# Patient Record
Sex: Female | Born: 1940 | Race: Black or African American | Hispanic: No | Marital: Married | Smoking: Never smoker
Health system: Southern US, Community
[De-identification: ages and names within clinical notes are randomized; demographics above are authoritative.]

## PROBLEM LIST (undated history)

## (undated) DIAGNOSIS — R011 Cardiac murmur, unspecified: Secondary | ICD-10-CM

## (undated) DIAGNOSIS — E039 Hypothyroidism, unspecified: Secondary | ICD-10-CM

## (undated) DIAGNOSIS — M1990 Unspecified osteoarthritis, unspecified site: Secondary | ICD-10-CM

## (undated) DIAGNOSIS — K259 Gastric ulcer, unspecified as acute or chronic, without hemorrhage or perforation: Secondary | ICD-10-CM

## (undated) DIAGNOSIS — Z8719 Personal history of other diseases of the digestive system: Secondary | ICD-10-CM

## (undated) DIAGNOSIS — E559 Vitamin D deficiency, unspecified: Secondary | ICD-10-CM

## (undated) DIAGNOSIS — M109 Gout, unspecified: Secondary | ICD-10-CM

## (undated) DIAGNOSIS — E7800 Pure hypercholesterolemia, unspecified: Secondary | ICD-10-CM

## (undated) DIAGNOSIS — K5900 Constipation, unspecified: Secondary | ICD-10-CM

## (undated) DIAGNOSIS — J45909 Unspecified asthma, uncomplicated: Secondary | ICD-10-CM

## (undated) DIAGNOSIS — N289 Disorder of kidney and ureter, unspecified: Secondary | ICD-10-CM

## (undated) DIAGNOSIS — K219 Gastro-esophageal reflux disease without esophagitis: Secondary | ICD-10-CM

## (undated) DIAGNOSIS — Z9889 Other specified postprocedural states: Secondary | ICD-10-CM

## (undated) DIAGNOSIS — R739 Hyperglycemia, unspecified: Secondary | ICD-10-CM

## (undated) DIAGNOSIS — F32A Depression, unspecified: Secondary | ICD-10-CM

## (undated) DIAGNOSIS — I10 Essential (primary) hypertension: Secondary | ICD-10-CM

## (undated) DIAGNOSIS — H3552 Pigmentary retinal dystrophy: Secondary | ICD-10-CM

## (undated) DIAGNOSIS — C801 Malignant (primary) neoplasm, unspecified: Secondary | ICD-10-CM

## (undated) DIAGNOSIS — F419 Anxiety disorder, unspecified: Secondary | ICD-10-CM

## (undated) DIAGNOSIS — F329 Major depressive disorder, single episode, unspecified: Secondary | ICD-10-CM

## (undated) DIAGNOSIS — J449 Chronic obstructive pulmonary disease, unspecified: Secondary | ICD-10-CM

## (undated) HISTORY — DX: Personal history of other diseases of the digestive system: Z87.19

## (undated) HISTORY — DX: Unspecified asthma, uncomplicated: J45.909

## (undated) HISTORY — DX: Vitamin D deficiency, unspecified: E55.9

## (undated) HISTORY — DX: Malignant (primary) neoplasm, unspecified: C80.1

## (undated) HISTORY — DX: Unspecified osteoarthritis, unspecified site: M19.90

## (undated) HISTORY — DX: Gastric ulcer, unspecified as acute or chronic, without hemorrhage or perforation: K25.9

## (undated) HISTORY — PX: ROTATOR CUFF REPAIR: SHX139

## (undated) HISTORY — DX: Hypothyroidism, unspecified: E03.9

## (undated) HISTORY — DX: Hyperglycemia, unspecified: R73.9

## (undated) HISTORY — DX: Cardiac murmur, unspecified: R01.1

## (undated) HISTORY — DX: Pigmentary retinal dystrophy: H35.52

## (undated) HISTORY — PX: ABDOMINAL HYSTERECTOMY: SHX81

## (undated) HISTORY — PX: BREAST SURGERY: SHX581

---

## 1998-01-22 HISTORY — PX: LAPAROSCOPY: SHX197

## 2001-01-22 DIAGNOSIS — Z9889 Other specified postprocedural states: Secondary | ICD-10-CM

## 2001-01-22 HISTORY — DX: Other specified postprocedural states: Z98.890

## 2003-11-05 ENCOUNTER — Ambulatory Visit: Payer: Self-pay | Admitting: Occupational Therapy

## 2003-11-23 ENCOUNTER — Ambulatory Visit: Payer: Self-pay | Admitting: Occupational Therapy

## 2004-04-22 ENCOUNTER — Emergency Department (HOSPITAL_COMMUNITY): Admission: EM | Admit: 2004-04-22 | Discharge: 2004-04-22 | Payer: Self-pay | Admitting: Emergency Medicine

## 2006-09-19 ENCOUNTER — Ambulatory Visit: Payer: Self-pay | Admitting: Nurse Practitioner

## 2006-12-10 ENCOUNTER — Ambulatory Visit: Payer: Self-pay | Admitting: Nurse Practitioner

## 2007-01-09 ENCOUNTER — Ambulatory Visit: Payer: Self-pay | Admitting: Nurse Practitioner

## 2007-04-01 ENCOUNTER — Ambulatory Visit: Payer: Self-pay | Admitting: Nurse Practitioner

## 2007-04-28 ENCOUNTER — Ambulatory Visit: Payer: Self-pay | Admitting: Nurse Practitioner

## 2008-05-03 ENCOUNTER — Ambulatory Visit: Payer: Self-pay | Admitting: Obstetrics and Gynecology

## 2008-05-04 ENCOUNTER — Ambulatory Visit: Payer: Self-pay | Admitting: Obstetrics and Gynecology

## 2008-07-02 ENCOUNTER — Ambulatory Visit: Payer: Self-pay | Admitting: Urology

## 2008-07-15 ENCOUNTER — Ambulatory Visit: Payer: Self-pay | Admitting: Obstetrics and Gynecology

## 2008-07-27 ENCOUNTER — Inpatient Hospital Stay: Payer: Self-pay | Admitting: Obstetrics and Gynecology

## 2008-11-30 ENCOUNTER — Ambulatory Visit: Payer: Self-pay | Admitting: Nurse Practitioner

## 2009-04-23 ENCOUNTER — Emergency Department (HOSPITAL_COMMUNITY): Admission: EM | Admit: 2009-04-23 | Discharge: 2009-04-23 | Payer: Self-pay | Admitting: Emergency Medicine

## 2009-08-04 ENCOUNTER — Ambulatory Visit: Payer: Self-pay

## 2009-12-01 ENCOUNTER — Ambulatory Visit: Payer: Self-pay | Admitting: Unknown Physician Specialty

## 2009-12-14 ENCOUNTER — Ambulatory Visit: Payer: Self-pay | Admitting: Unknown Physician Specialty

## 2009-12-22 ENCOUNTER — Ambulatory Visit: Payer: Self-pay | Admitting: Unknown Physician Specialty

## 2010-02-20 ENCOUNTER — Encounter: Payer: Self-pay | Admitting: Surgery

## 2010-02-22 ENCOUNTER — Encounter: Payer: Self-pay | Admitting: Surgery

## 2010-03-23 ENCOUNTER — Encounter: Payer: Self-pay | Admitting: Surgery

## 2010-05-30 ENCOUNTER — Ambulatory Visit: Payer: Self-pay

## 2010-11-03 ENCOUNTER — Ambulatory Visit: Payer: Self-pay | Admitting: Nurse Practitioner

## 2011-01-06 ENCOUNTER — Inpatient Hospital Stay (HOSPITAL_COMMUNITY)
Admission: EM | Admit: 2011-01-06 | Discharge: 2011-01-08 | DRG: 392 | Disposition: A | Discharge status: Home / Self Care | Payer: Medicare Other | Attending: Internal Medicine | Admitting: Internal Medicine

## 2011-01-06 ENCOUNTER — Encounter: Payer: Self-pay | Admitting: Emergency Medicine

## 2011-01-06 ENCOUNTER — Emergency Department (HOSPITAL_COMMUNITY): Payer: Medicare Other

## 2011-01-06 DIAGNOSIS — F419 Anxiety disorder, unspecified: Secondary | ICD-10-CM

## 2011-01-06 DIAGNOSIS — A09 Infectious gastroenteritis and colitis, unspecified: Principal | ICD-10-CM | POA: Diagnosis present

## 2011-01-06 DIAGNOSIS — R1310 Dysphagia, unspecified: Secondary | ICD-10-CM | POA: Diagnosis present

## 2011-01-06 DIAGNOSIS — K219 Gastro-esophageal reflux disease without esophagitis: Secondary | ICD-10-CM | POA: Diagnosis present

## 2011-01-06 DIAGNOSIS — K625 Hemorrhage of anus and rectum: Secondary | ICD-10-CM

## 2011-01-06 DIAGNOSIS — K921 Melena: Secondary | ICD-10-CM

## 2011-01-06 DIAGNOSIS — I10 Essential (primary) hypertension: Secondary | ICD-10-CM | POA: Diagnosis present

## 2011-01-06 DIAGNOSIS — E785 Hyperlipidemia, unspecified: Secondary | ICD-10-CM

## 2011-01-06 DIAGNOSIS — E039 Hypothyroidism, unspecified: Secondary | ICD-10-CM | POA: Diagnosis present

## 2011-01-06 DIAGNOSIS — K529 Noninfective gastroenteritis and colitis, unspecified: Secondary | ICD-10-CM

## 2011-01-06 DIAGNOSIS — F411 Generalized anxiety disorder: Secondary | ICD-10-CM | POA: Diagnosis present

## 2011-01-06 DIAGNOSIS — J45909 Unspecified asthma, uncomplicated: Secondary | ICD-10-CM | POA: Diagnosis present

## 2011-01-06 HISTORY — DX: Essential (primary) hypertension: I10

## 2011-01-06 HISTORY — DX: Disorder of kidney and ureter, unspecified: N28.9

## 2011-01-06 HISTORY — DX: Chronic obstructive pulmonary disease, unspecified: J44.9

## 2011-01-06 HISTORY — DX: Gastro-esophageal reflux disease without esophagitis: K21.9

## 2011-01-06 HISTORY — DX: Other specified postprocedural states: Z98.890

## 2011-01-06 HISTORY — DX: Anxiety disorder, unspecified: F41.9

## 2011-01-06 HISTORY — DX: Major depressive disorder, single episode, unspecified: F32.9

## 2011-01-06 HISTORY — DX: Depression, unspecified: F32.A

## 2011-01-06 HISTORY — DX: Pure hypercholesterolemia, unspecified: E78.00

## 2011-01-06 LAB — URINE MICROSCOPIC-ADD ON

## 2011-01-06 LAB — PROTIME-INR: INR: 0.94 (ref 0.00–1.49)

## 2011-01-06 LAB — URINALYSIS, ROUTINE W REFLEX MICROSCOPIC
Glucose, UA: NEGATIVE mg/dL
Protein, ur: NEGATIVE mg/dL
Urobilinogen, UA: 0.2 mg/dL (ref 0.0–1.0)

## 2011-01-06 LAB — COMPREHENSIVE METABOLIC PANEL
ALT: 18 U/L (ref 0–35)
BUN: 10 mg/dL (ref 6–23)
Calcium: 10.4 mg/dL (ref 8.4–10.5)
Glucose, Bld: 100 mg/dL — ABNORMAL HIGH (ref 70–99)
Total Protein: 8.2 g/dL (ref 6.0–8.3)

## 2011-01-06 LAB — DIFFERENTIAL
Basophils Absolute: 0 10*3/uL (ref 0.0–0.1)
Eosinophils Relative: 1 % (ref 0–5)
Lymphocytes Relative: 25 % (ref 12–46)
Lymphs Abs: 2.6 10*3/uL (ref 0.7–4.0)

## 2011-01-06 LAB — CBC
HCT: 40.9 % (ref 36.0–46.0)
Hemoglobin: 13.5 g/dL (ref 12.0–15.0)
RBC: 4.4 MIL/uL (ref 3.87–5.11)
RDW: 12.6 % (ref 11.5–15.5)
WBC: 10.2 10*3/uL (ref 4.0–10.5)

## 2011-01-06 LAB — TYPE AND SCREEN
ABO/RH(D): O POS
Antibody Screen: NEGATIVE

## 2011-01-06 LAB — APTT: aPTT: 33 seconds (ref 24–37)

## 2011-01-06 MED ORDER — HYDROCODONE-ACETAMINOPHEN 5-325 MG PO TABS
1.0000 | ORAL_TABLET | ORAL | Status: DC | PRN
  Administered 2011-01-08: 1 via ORAL
  Filled 2011-01-06: qty 1

## 2011-01-06 MED ORDER — ACETAMINOPHEN 650 MG RE SUPP: 650.0000 mg | Freq: Four times a day (QID) | RECTAL | Status: DC | PRN

## 2011-01-06 MED ORDER — ALBUTEROL SULFATE HFA 108 (90 BASE) MCG/ACT IN AERS
2.0000 | INHALATION_SPRAY | Freq: Four times a day (QID) | RESPIRATORY_TRACT | Status: DC | PRN
  Filled 2011-01-06: qty 6.7

## 2011-01-06 MED ORDER — ACETAMINOPHEN 325 MG PO TABS: 650.0000 mg | ORAL_TABLET | Freq: Four times a day (QID) | ORAL | Status: DC | PRN

## 2011-01-06 MED ORDER — CIPROFLOXACIN IN D5W 400 MG/200ML IV SOLN
400.0000 mg | Freq: Two times a day (BID) | INTRAVENOUS | Status: DC
  Administered 2011-01-07: 400 mg via INTRAVENOUS
  Filled 2011-01-06 (×3): qty 200

## 2011-01-06 MED ORDER — ONDANSETRON HCL 4 MG/2ML IJ SOLN: 4.0000 mg | Freq: Four times a day (QID) | INTRAMUSCULAR | Status: DC | PRN

## 2011-01-06 MED ORDER — CIPROFLOXACIN IN D5W 400 MG/200ML IV SOLN
400.0000 mg | Freq: Once | INTRAVENOUS | Status: AC
  Administered 2011-01-06: 400 mg via INTRAVENOUS
  Filled 2011-01-06: qty 200

## 2011-01-06 MED ORDER — PANTOPRAZOLE SODIUM 40 MG PO TBEC
40.0000 mg | DELAYED_RELEASE_TABLET | Freq: Every day | ORAL | Status: DC
  Administered 2011-01-06 – 2011-01-08 (×3): 40 mg via ORAL
  Filled 2011-01-06 (×3): qty 1

## 2011-01-06 MED ORDER — SODIUM CHLORIDE 0.9 % IV BOLUS (SEPSIS)
500.0000 mL | Freq: Once | INTRAVENOUS | Status: AC
  Administered 2011-01-06: 500 mL via INTRAVENOUS

## 2011-01-06 MED ORDER — METRONIDAZOLE IN NACL 5-0.79 MG/ML-% IV SOLN
500.0000 mg | Freq: Three times a day (TID) | INTRAVENOUS | Status: DC
  Administered 2011-01-07 (×2): 500 mg via INTRAVENOUS
  Filled 2011-01-06 (×5): qty 100

## 2011-01-06 MED ORDER — LEVOTHYROXINE SODIUM 50 MCG PO TABS
50.0000 ug | ORAL_TABLET | Freq: Every day | ORAL | Status: DC
  Administered 2011-01-07 – 2011-01-08 (×2): 50 ug via ORAL
  Filled 2011-01-06 (×2): qty 1

## 2011-01-06 MED ORDER — LORAZEPAM 1 MG PO TABS
1.0000 mg | ORAL_TABLET | Freq: Two times a day (BID) | ORAL | Status: DC
  Administered 2011-01-06 – 2011-01-08 (×4): 1 mg via ORAL
  Filled 2011-01-06 (×4): qty 1

## 2011-01-06 MED ORDER — POTASSIUM CHLORIDE IN NACL 20-0.9 MEQ/L-% IV SOLN
INTRAVENOUS | Status: DC
  Administered 2011-01-06: 1000 mL via INTRAVENOUS

## 2011-01-06 MED ORDER — CITALOPRAM HYDROBROMIDE 20 MG PO TABS
40.0000 mg | ORAL_TABLET | Freq: Every day | ORAL | Status: DC
  Administered 2011-01-07 – 2011-01-08 (×2): 40 mg via ORAL
  Filled 2011-01-06 (×2): qty 2

## 2011-01-06 MED ORDER — MORPHINE SULFATE 2 MG/ML IJ SOLN: 2.0000 mg | INTRAMUSCULAR | Status: DC | PRN

## 2011-01-06 MED ORDER — TRAZODONE HCL 50 MG PO TABS
50.0000 mg | ORAL_TABLET | Freq: Every evening | ORAL | Status: DC | PRN
  Administered 2011-01-06 – 2011-01-07 (×2): 50 mg via ORAL
  Filled 2011-01-06 (×2): qty 1

## 2011-01-06 MED ORDER — ONDANSETRON HCL 4 MG PO TABS: 4.0000 mg | ORAL_TABLET | Freq: Four times a day (QID) | ORAL | Status: DC | PRN

## 2011-01-06 MED ORDER — METRONIDAZOLE IN NACL 5-0.79 MG/ML-% IV SOLN
500.0000 mg | Freq: Once | INTRAVENOUS | Status: AC
  Administered 2011-01-06: 500 mg via INTRAVENOUS
  Filled 2011-01-06: qty 100

## 2011-01-06 MED ORDER — SIMVASTATIN 20 MG PO TABS
40.0000 mg | ORAL_TABLET | Freq: Every day | ORAL | Status: DC
  Administered 2011-01-07: 40 mg via ORAL
  Filled 2011-01-06: qty 2

## 2011-01-06 MED ORDER — ALUM & MAG HYDROXIDE-SIMETH 200-200-25 MG PO CHEW
1.0000 | CHEWABLE_TABLET | Freq: Three times a day (TID) | ORAL | Status: DC | PRN
  Filled 2011-01-06: qty 2

## 2011-01-06 MED ORDER — FLUTICASONE PROPIONATE HFA 110 MCG/ACT IN AERO
1.0000 | INHALATION_SPRAY | Freq: Two times a day (BID) | RESPIRATORY_TRACT | Status: DC
  Administered 2011-01-07 – 2011-01-08 (×4): 1 via RESPIRATORY_TRACT
  Filled 2011-01-06: qty 12

## 2011-01-06 NOTE — ED Notes (Addendum)
Placed on cardiac monitor, continuous bp monitoring and continuous pulse ox.  NSR rate 78.

## 2011-01-06 NOTE — ED Notes (Signed)
EDP in room to do rectal exam-accompanied by Chapman Moss RN.

## 2011-01-06 NOTE — ED Notes (Signed)
Pt c/o abd pain with diarrhea since Friday. Pt states she had a bowel movement today and wiped and there was blood on the tissue.

## 2011-01-06 NOTE — ED Provider Notes (Addendum)
History     CSN: [id number] Arrival date & time: 01/06/2011 12:20 PM   First MD Initiated Contact with Patient 01/06/11 1316      Chief Complaint  Patient presents with  . Abdominal Pain  . Rectal Bleeding  . Diarrhea    (Consider location/radiation/quality/duration/timing/severity/associated sxs/prior treatment) HPI Patient with diarrhea for past day.  Now with rectal bleeding today.  She has some llq crampy pain.  No vomiting or fever.  Generally weak, no syncope or chest pain.   Past Medical History  Diagnosis Date  . Renal disorder   . Hypercholesteremia   . Hypertension   . Acid reflux   . Anxiety   . Depression   . COPD (chronic obstructive pulmonary disease)     Past Surgical History  Procedure Date  . Abdominal hysterectomy   . Cholecystectomy   . Rotary cuff repair     History reviewed. No pertinent family history.  History  Substance Use Topics  . Smoking status: Not on file  . Smokeless tobacco: Not on file  . Alcohol Use: No    OB History    Grav Para Term Preterm Abortions TAB SAB Ect Mult Living                  Review of Systems  All other systems reviewed and are negative.    Allergies  Ivp dye  Home Medications  No current outpatient prescriptions on file.  BP 148/72  Pulse 88  Temp(Src) 98.5 F (36.9 C) (Oral)  Resp 20  Ht 5\' 2"  (1.575 m)  Wt 128 lb (58.06 kg)  BMI 23.41 kg/m2  SpO2 99%  Physical Exam  Nursing note and vitals reviewed. Constitutional: She is oriented to person, place, and time. She appears well-developed and well-nourished.  HENT:  Head: Normocephalic.  Right Ear: External ear normal.  Left Ear: External ear normal.  Nose: Nose normal.  Mouth/Throat: Oropharynx is clear and moist.  Eyes: Conjunctivae are normal. Pupils are equal, round, and reactive to light.  Neck: Normal range of motion. Neck supple.  Cardiovascular: Normal rate, regular rhythm and normal heart sounds.   Pulmonary/Chest: Effort  normal and breath sounds normal.  Abdominal: Soft. There is tenderness.  Genitourinary:       Grossly bloody stool on rectal exam  Musculoskeletal: Normal range of motion.  Neurological: She is alert and oriented to person, place, and time. She has normal reflexes.  Skin: Skin is warm and dry.  Psychiatric: She has a normal mood and affect.    ED Course  Procedures (including critical care time)  Labs Reviewed  COMPREHENSIVE METABOLIC PANEL - Abnormal; Notable for the following:    Potassium 3.4 (*)    Glucose, Bld 100 (*)    GFR calc non Af Amer 62 (*)    GFR calc Af Amer 72 (*)    All other components within normal limits  CBC  DIFFERENTIAL  PROTIME-INR  APTT  TYPE AND SCREEN  URINALYSIS, ROUTINE W REFLEX MICROSCOPIC   No results found.   No diagnosis found.    MDM  Patient hemodynamically stable here.  CT reviewed and cipro and flagyl ordered.  Patient feels like she needs to stool and if she does will send for culture.          Hilario Quarry, MD 01/06/11 1644  Hilario Quarry, MD 03/30/11 4098  Hilario Quarry, MD 04/05/11 1544

## 2011-01-07 DIAGNOSIS — J45909 Unspecified asthma, uncomplicated: Secondary | ICD-10-CM | POA: Diagnosis present

## 2011-01-07 DIAGNOSIS — K921 Melena: Secondary | ICD-10-CM | POA: Diagnosis present

## 2011-01-07 DIAGNOSIS — I10 Essential (primary) hypertension: Secondary | ICD-10-CM | POA: Diagnosis present

## 2011-01-07 DIAGNOSIS — E039 Hypothyroidism, unspecified: Secondary | ICD-10-CM | POA: Diagnosis present

## 2011-01-07 DIAGNOSIS — K219 Gastro-esophageal reflux disease without esophagitis: Secondary | ICD-10-CM | POA: Diagnosis present

## 2011-01-07 DIAGNOSIS — K529 Noninfective gastroenteritis and colitis, unspecified: Secondary | ICD-10-CM | POA: Diagnosis present

## 2011-01-07 DIAGNOSIS — E785 Hyperlipidemia, unspecified: Secondary | ICD-10-CM | POA: Diagnosis present

## 2011-01-07 DIAGNOSIS — F419 Anxiety disorder, unspecified: Secondary | ICD-10-CM | POA: Diagnosis present

## 2011-01-07 LAB — BASIC METABOLIC PANEL
Calcium: 9.3 mg/dL (ref 8.4–10.5)
GFR calc non Af Amer: 60 mL/min — ABNORMAL LOW (ref >90)
Sodium: 142 mEq/L (ref 135–145)

## 2011-01-07 LAB — CBC
Platelets: 199 10*3/uL (ref 150–400)
RBC: 3.78 MIL/uL — ABNORMAL LOW (ref 3.87–5.11)
WBC: 7 10*3/uL (ref 4.0–10.5)

## 2011-01-07 MED ORDER — AMLODIPINE BESYLATE 5 MG PO TABS
10.0000 mg | ORAL_TABLET | Freq: Every day | ORAL | Status: DC
  Administered 2011-01-07: 10 mg via ORAL
  Filled 2011-01-07: qty 2

## 2011-01-07 MED ORDER — METRONIDAZOLE IN NACL 5-0.79 MG/ML-% IV SOLN
INTRAVENOUS | Status: AC
  Filled 2011-01-07: qty 100

## 2011-01-07 MED ORDER — CIPROFLOXACIN IN D5W 400 MG/200ML IV SOLN
INTRAVENOUS | Status: AC
  Filled 2011-01-07: qty 200

## 2011-01-07 MED ORDER — CIPROFLOXACIN HCL 250 MG PO TABS
500.0000 mg | ORAL_TABLET | Freq: Two times a day (BID) | ORAL | Status: DC
  Administered 2011-01-07 – 2011-01-08 (×3): 500 mg via ORAL
  Filled 2011-01-07 (×3): qty 2

## 2011-01-07 MED ORDER — METRONIDAZOLE 500 MG PO TABS
500.0000 mg | ORAL_TABLET | Freq: Three times a day (TID) | ORAL | Status: DC
  Administered 2011-01-07 – 2011-01-08 (×3): 500 mg via ORAL
  Filled 2011-01-07 (×3): qty 1

## 2011-01-07 MED ORDER — BENAZEPRIL HCL 10 MG PO TABS
20.0000 mg | ORAL_TABLET | Freq: Every day | ORAL | Status: DC
  Administered 2011-01-07: 20 mg via ORAL
  Filled 2011-01-07: qty 2

## 2011-01-07 MED ORDER — ALUM & MAG HYDROXIDE-SIMETH 200-200-20 MG/5ML PO SUSP: 30.0000 mL | Freq: Three times a day (TID) | ORAL | Status: DC | PRN

## 2011-01-07 NOTE — Progress Notes (Signed)
Subjective: Feels better. No Stools. Tolerating clear liquid diet. Pain much improved. No nausea.  Objective: Vital signs in last 24 hours: Filed Vitals:   01/06/11 2246 01/07/11 0113 01/07/11 0607 01/07/11 1102  BP: 152/74  123/68   Pulse: 68  61   Temp: 97.8 F (36.6 C)  97.8 F (36.6 C)   TempSrc: Oral  Oral   Resp: 18  18   Height:      Weight:      SpO2: 97% 97% 97% 97%   Weight change:   Intake/Output Summary (Last 24 hours) at 01/07/11 1142 Last data filed at 01/07/11 0900  Gross per 24 hour  Intake    360 ml  Output      0 ml  Net    360 ml   Physical Exam: Abdominal exam much less tender otherwise unchanged from 01/06/2011  Lab Results: Basic Metabolic Panel:  Lab 01/07/11 4098 01/06/11 1330  NA 142 138  K 4.1 3.4*  CL 109 102  CO2 28 27  GLUCOSE 111* 100*  BUN 6 10  CREATININE 0.94 0.91  CALCIUM 9.3 10.4  MG -- --  PHOS -- --   Liver Function Tests:  Lab 01/06/11 1330  AST 19  ALT 18  ALKPHOS 100  BILITOT 0.4  PROT 8.2  ALBUMIN 4.2   No results found for this basename: LIPASE:2,AMYLASE:2 in the last 168 hours No results found for this basename: AMMONIA:2 in the last 168 hours CBC:  Lab 01/07/11 0644 01/06/11 1330  WBC 7.0 10.2  NEUTROABS -- 6.6  HGB 11.5* 13.5  HCT 35.4* 40.9  MCV 93.7 93.0  PLT 199 216   Cardiac Enzymes: No results found for this basename: CKTOTAL:3,CKMB:3,CKMBINDEX:3,TROPONINI:3 in the last 168 hours BNP: No components found with this basename: POCBNP:3 D-Dimer: No results found for this basename: DDIMER:2 in the last 168 hours CBG: No results found for this basename: GLUCAP:6 in the last 168 hours Hemoglobin A1C: No results found for this basename: HGBA1C in the last 168 hours Fasting Lipid Panel: No results found for this basename: CHOL,HDL,LDLCALC,TRIG,CHOLHDL,LDLDIRECT in the last 119 hours Thyroid Function Tests: No results found for this basename: TSH,T4TOTAL,FREET4,T3FREE,THYROIDAB in the last 168  hours Coagulation:  Lab 01/06/11 1330  LABPROT 12.8  INR 0.94    Micro Results: Recent Results (from the past 240 hour(s))  CLOSTRIDIUM DIFFICILE BY PCR     Status: Normal   Collection Time   01/06/11  5:29 PM      Component Value Range Status Comment   C difficile by pcr NEGATIVE  NEGATIVE  Final    Studies/Results: Ct Abdomen Pelvis Wo Contrast  01/06/2011  *RADIOLOGY REPORT*  Clinical Data: Rectal bleeding.  CT ABDOMEN AND PELVIS WITHOUT CONTRAST  Technique:  Multidetector CT imaging of the abdomen and pelvis was performed following the standard protocol without intravenous contrast.  Comparison: None  Findings: The lung bases are clear.  The unenhanced appearance of the liver is unremarkable.  No focal lesions or biliary dilatation.  The gallbladder appears normal.  No common bile duct dilatation.  The pancreas is normal.  The spleen is normal in size.  No focal lesions.  The adrenal glands are unremarkable.  There is a pelvic left kidney.  The right kidney demonstrates a simple appearing cyst.  The stomach is not well descend with contrast but no gross abnormalities are seen.  The duodenum and small bowel are normal except for a few thickened small bowel loops in the right  pelvis. The appendix is normal.  There is diffuse colonic wall thickening involving the descending colon with some surrounding pericolonic inflammatory changes.  No discrete lesion or findings for diverticulitis.  The sigmoid colon is unremarkable and the rectum appears normal.  The uterus and left ovary appear normal.  I do not see the right ovary for certain.  The bladder is grossly normal.  No pelvic mass, adenopathy or free pelvic fluid collection.  A the aorta is normal in caliber.  Mild atherosclerotic changes.  IMPRESSION:  1.  Diffuse wall thickening and pericolonic inflammation involving the descending colon consistent with inflammatory or infectious colitis. 2.  Mild wall thickening involving a few distal small  bowel loops. 3.  Pelvic left kidney.  Original Report Authenticated By: P. Loralie Champagne, M.D.   Scheduled Meds:   . ciprofloxacin  400 mg Intravenous Once  . ciprofloxacin  500 mg Oral BID  . citalopram  40 mg Oral Daily  . fluticasone  1 puff Inhalation BID  . levothyroxine  50 mcg Oral QAC breakfast  . LORazepam  1 mg Oral BID  . metronidazole  500 mg Intravenous Once  . metroNIDAZOLE  500 mg Oral Q8H  . pantoprazole  40 mg Oral Q1200  . simvastatin  40 mg Oral q1800  . sodium chloride  500 mL Intravenous Once  . DISCONTD: ciprofloxacin  400 mg Intravenous Q12H  . DISCONTD: metronidazole  500 mg Intravenous Q8H   Continuous Infusions:   . DISCONTD: 0.9 % NaCl with KCl 20 mEq / L 1,000 mL (01/06/11 1953)   PRN Meds:.acetaminophen, albuterol, alum & mag hydroxide-simeth, HYDROcodone-acetaminophen, morphine, ondansetron (ZOFRAN) IV, ondansetron, traZODone, DISCONTD: acetaminophen, DISCONTD: alum & mag hydroxide-simeth Assessment/Plan: Active Problems:  Colitis  Hematochezia  Benign hypertension  Asthma  Hyperlipidemia  Anxiety  Hypothyroidism  GERD (gastroesophageal reflux disease)  Presumed infectious colitis improving on empiric antibiotics. Will advance diet to low residue, change antibiotics to by mouth. Decrease IV fluids. Outpatient GI followup. Resume home medications.  LOS: 1 day   Haden Cavenaugh L 01/07/2011, 11:42 AM

## 2011-01-07 NOTE — H&P (Signed)
Hospital Admission Note Date: 01/07/2011  Patient name: Kelly Hebert Medical record number: [id number] Date of birth: [birth year] Age: 70 y.o. Gender: female PCP: No primary provider on file.  Attending physician: Kelly Hebert  Chief Complaint: Bleeding  History of Present Illness:  Kelly Hebert is an 70 y.o. female who presents with rectal bleeding today. She is left-sided and lower abdominal tenderness. She's had no nausea. She's had no recent travel. She drinks well water. No fevers or chills. She has had this same pain in the past but never bleeding. She had a colonoscopy over 10 years ago which showed polyps only. She has a history of ulcers many years ago. She takes an aspirin a day. No nonsteroidal anti-inflammatories. She had potato salad the night before becoming ill. No sick contacts.  Past Medical History  Diagnosis Date  . Renal disorder   . Hypercholesteremia   . Hypertension   . Acid reflux   . Anxiety   . Depression   . COPD (chronic obstructive pulmonary disease)     Meds: Prescriptions prior to admission  Medication Sig Dispense Refill  . albuterol (PROVENTIL HFA;VENTOLIN HFA) 108 (90 BASE) MCG/ACT inhaler Inhale 2 puffs into the lungs every 6 (six) hours as needed. Shortness of breath       . amLODipine (NORVASC) 10 MG tablet Take 10 mg by mouth daily.        Marland Kitchen aspirin EC 81 MG tablet Take 81 mg by mouth daily.        . benazepril-hydrochlorthiazide (LOTENSIN HCT) 20-12.5 MG per tablet Take 2 tablets by mouth daily.        . citalopram (CELEXA) 40 MG tablet Take 40 mg by mouth daily.        . fluticasone (FLONASE) 50 MCG/ACT nasal spray Place 2 sprays into the nose daily.        . fluticasone (FLOVENT HFA) 110 MCG/ACT inhaler Inhale 1 puff into the lungs 2 (two) times daily.        Marland Kitchen levothyroxine (SYNTHROID, LEVOTHROID) 50 MCG tablet Take 50 mcg by mouth daily.        Marland Kitchen LORazepam (ATIVAN) 1 MG tablet Take 1 mg by mouth 2 (two) times daily.       .  magnesium gluconate (MAGONATE) 500 MG tablet Take 500 mg by mouth daily.        Marland Kitchen omeprazole (PRILOSEC) 20 MG capsule Take 20 mg by mouth daily.        . simvastatin (ZOCOR) 40 MG tablet Take 40 mg by mouth daily.        . Vitamin D, Ergocalciferol, (DRISDOL) 50000 UNITS CAPS Take 50,000 Units by mouth every 30 (thirty) days. Patient takes twice a month         Allergies: Ivp dye History   Social History  . Marital Status: Married    Spouse Name: N/A    Number of Children: N/A  . Years of Education: N/A   Occupational History  . Not on file.   Social History Main Topics  . Smoking status: Never Smoker   . Smokeless tobacco: Not on file  . Alcohol Use: No  . Drug Use: No  . Sexually Active:    Other Topics Concern  . Not on file   Social History Narrative  . No narrative on file   History reviewed. No pertinent family history. Past Surgical History  Procedure Date  . Abdominal hysterectomy   . Cholecystectomy   . Rotary  cuff repair     Review of Systems: Systems reviewed and as per HPI, otherwise negative.  Physical Exam: Blood pressure 123/68, pulse 61, temperature 97.8 F (36.6 C), temperature source Oral, resp. rate 18, height 5\' 2"  (1.575 m), weight 58.6 kg (129 lb 3 oz), SpO2 97.00%. BP 123/68  Pulse 61  Temp(Src) 97.8 F (36.6 C) (Oral)  Resp 18  Ht 5\' 2"  (1.575 m)  Wt 58.6 kg (129 lb 3 oz)  BMI 23.63 kg/m2  SpO2 97%  General Appearance:    Alert, cooperative, no distress, appears stated age  Head:    Normocephalic, without obvious abnormality, atraumatic  Eyes:    PERRL, conjunctiva/corneas clear, EOM's intact, fundi    benign, both eyes  Ears:    Normal TM's and external ear canals, both ears  Nose:   Nares normal, septum midline, mucosa normal, no drainage    or sinus tenderness  Throat:   Lips, mucosa, and tongue normal; teeth and gums normal  Neck:   Supple, symmetrical, trachea midline, no adenopathy;    thyroid:  no  enlargement/tenderness/nodules; no carotid   bruit or JVD  Back:     Symmetric, no curvature, ROM normal, no CVA tenderness  Lungs:     Clear to auscultation bilaterally, respirations unlabored  Chest Wall:    No tenderness or deformity   Heart:    Regular rate and rhythm, S1 and S2 normal, no murmur, rub   or gallop     Abdomen:     Soft, left-sided tenderness, nondistended   Genitalia:   deferred   Rectal:   per ED physician shows bloody stool.   Extremities:   Extremities normal, atraumatic, no cyanosis or edema  Pulses:   2+ and symmetric all extremities  Skin:   Skin color, texture, turgor normal, no rashes or lesions  Lymph nodes:   Cervical, supraclavicular, and axillary nodes normal  Neurologic:   CNII-XII intact, normal strength, sensation and reflexes    throughout    Psychiatric: Normal affect  Lab results: Basic Metabolic Panel:  Basename 01/06/11 1330  NA 138  K 3.4*  CL 102  CO2 27  GLUCOSE 100*  BUN 10  CREATININE 0.91  CALCIUM 10.4  MG --  PHOS --   Liver Function Tests:  Basename 01/06/11 1330  AST 19  ALT 18  ALKPHOS 100  BILITOT 0.4  PROT 8.2  ALBUMIN 4.2   No results found for this basename: LIPASE:2,AMYLASE:2 in the last 72 hours No results found for this basename: AMMONIA:2 in the last 72 hours CBC:  Basename 01/06/11 1330  WBC 10.2  NEUTROABS 6.6  HGB 13.5  HCT 40.9  MCV 93.0  PLT 216   Coagulation:  Basename 01/06/11 1330  LABPROT 12.8  INR 0.94     Imaging results:  Ct Abdomen Pelvis Wo Contrast  01/06/2011  *RADIOLOGY REPORT*  Clinical Data: Rectal bleeding.  CT ABDOMEN AND PELVIS WITHOUT CONTRAST  Technique:  Multidetector CT imaging of the abdomen and pelvis was performed following the standard protocol without intravenous contrast.  Comparison: None  Findings: The lung bases are clear.  The unenhanced appearance of the liver is unremarkable.  No focal lesions or biliary dilatation.  The gallbladder appears normal.  No  common bile duct dilatation.  The pancreas is normal.  The spleen is normal in size.  No focal lesions.  The adrenal glands are unremarkable.  There is a pelvic left kidney.  The right kidney demonstrates a simple  appearing cyst.  The stomach is not well descend with contrast but no gross abnormalities are seen.  The duodenum and small bowel are normal except for a few thickened small bowel loops in the right pelvis. The appendix is normal.  There is diffuse colonic wall thickening involving the descending colon with some surrounding pericolonic inflammatory changes.  No discrete lesion or findings for diverticulitis.  The sigmoid colon is unremarkable and the rectum appears normal.  The uterus and left ovary appear normal.  I do not see the right ovary for certain.  The bladder is grossly normal.  No pelvic mass, adenopathy or free pelvic fluid collection.  A the aorta is normal in caliber.  Mild atherosclerotic changes.  IMPRESSION:  1.  Diffuse wall thickening and pericolonic inflammation involving the descending colon consistent with inflammatory or infectious colitis. 2.  Mild wall thickening involving a few distal small bowel loops. 3.  Pelvic left kidney.  Original Report Authenticated By: P. Loralie Champagne, M.D.    Assessment & Plan: Active Problems:  Colitis  Hematochezia  Benign hypertension  Asthma  Hyperlipidemia  Anxiety  Hypothyroidism  GERD (gastroesophageal reflux disease) hypokalemia  Patient will be started empirically on Cipro, Flagyl, clear liquid diet. Check stool for C. difficile, culture, ova and parasites. IV fluid and pain medications. She may end up needing endoscopy, depending on her progress. Replete potassium.  Merry Pond L 01/07/2011, 8:03 AM

## 2011-01-08 ENCOUNTER — Telehealth: Payer: Self-pay | Admitting: Urgent Care

## 2011-01-08 ENCOUNTER — Encounter (HOSPITAL_COMMUNITY): Payer: Self-pay | Admitting: Urgent Care

## 2011-01-08 DIAGNOSIS — R1310 Dysphagia, unspecified: Secondary | ICD-10-CM

## 2011-01-08 DIAGNOSIS — R197 Diarrhea, unspecified: Secondary | ICD-10-CM

## 2011-01-08 DIAGNOSIS — K5289 Other specified noninfective gastroenteritis and colitis: Secondary | ICD-10-CM

## 2011-01-08 DIAGNOSIS — K219 Gastro-esophageal reflux disease without esophagitis: Secondary | ICD-10-CM

## 2011-01-08 MED ORDER — CIPROFLOXACIN HCL 500 MG PO TABS: 500.0000 mg | ORAL_TABLET | Freq: Two times a day (BID) | ORAL | Status: AC

## 2011-01-08 MED ORDER — METRONIDAZOLE 500 MG PO TABS: 500.0000 mg | ORAL_TABLET | Freq: Three times a day (TID) | ORAL | Status: AC

## 2011-01-08 MED ORDER — ACETAMINOPHEN 325 MG PO TABS: 650.0000 mg | ORAL_TABLET | Freq: Four times a day (QID) | ORAL | Status: AC | PRN

## 2011-01-08 MED ORDER — HYDROCODONE-ACETAMINOPHEN 5-325 MG PO TABS: 1.0000 | ORAL_TABLET | Freq: Four times a day (QID) | ORAL | Status: AC | PRN

## 2011-01-08 MED ORDER — ALUM & MAG HYDROXIDE-SIMETH 200-200-20 MG/5ML PO SUSP: 30.0000 mL | Freq: Three times a day (TID) | ORAL | Status: AC | PRN

## 2011-01-08 NOTE — Progress Notes (Signed)
Pt discharged home in stable condition.  Pt has no complaints of N/V.  Pt tolerating low fiber diet and PO antibiotics.  Follow up appt with GI made for 1 month.  Reviewed new meds with pt (antibiotics and pain)  Pt instructed not to drive while taking pain medication and instructed to complete antibiotic doses.  Pt verbalizes understanding.

## 2011-01-08 NOTE — Discharge Summary (Signed)
Physician Discharge Summary  Patient ID: HAJRA PORT MRN: [id number] DOB/AGE: 1940-09-27 70 y.o.  Admit date: 01/06/2011 Discharge date: 01/08/2011  Discharge Diagnoses:  Active Problems:  Colitis  Hematochezia  Benign hypertension  Asthma  Hyperlipidemia  Anxiety  Hypothyroidism  GERD (gastroesophageal reflux disease)   Current Discharge Medication List    START taking these medications   Details  acetaminophen (TYLENOL) 325 MG tablet Take 2 tablets (650 mg total) by mouth every 6 (six) hours as needed for pain or fever (or Fever >/= 101). Qty: 30 tablet    alum & mag hydroxide-simeth (MAALOX/MYLANTA) 200-200-20 MG/5ML suspension Take 30 mLs by mouth 3 (three) times daily as needed. Qty: 355 mL    ciprofloxacin (CIPRO) 500 MG tablet Take 1 tablet (500 mg total) by mouth 2 (two) times daily. Qty: 10 tablet, Refills: 0    HYDROcodone-acetaminophen (NORCO) 5-325 MG per tablet Take 1-2 tablets by mouth every 6 (six) hours as needed for pain. Qty: 20 tablet, Refills: 0    metroNIDAZOLE (FLAGYL) 500 MG tablet Take 1 tablet (500 mg total) by mouth every 8 (eight) hours. Qty: 15 tablet, Refills: 0      CONTINUE these medications which have NOT CHANGED   Details  albuterol (PROVENTIL HFA;VENTOLIN HFA) 108 (90 BASE) MCG/ACT inhaler Inhale 2 puffs into the lungs every 6 (six) hours as needed. Shortness of breath     citalopram (CELEXA) 40 MG tablet Take 40 mg by mouth daily.      fluticasone (FLONASE) 50 MCG/ACT nasal spray Place 2 sprays into the nose daily.      fluticasone (FLOVENT HFA) 110 MCG/ACT inhaler Inhale 1 puff into the lungs 2 (two) times daily.      levothyroxine (SYNTHROID, LEVOTHROID) 50 MCG tablet Take 50 mcg by mouth daily.      LORazepam (ATIVAN) 1 MG tablet Take 1 mg by mouth 2 (two) times daily.     magnesium gluconate (MAGONATE) 500 MG tablet Take 500 mg by mouth daily.      omeprazole (PRILOSEC) 20 MG capsule Take 20 mg by mouth daily.        simvastatin (ZOCOR) 40 MG tablet Take 40 mg by mouth daily.      Vitamin D, Ergocalciferol, (DRISDOL) 50000 UNITS CAPS Take 50,000 Units by mouth every 30 (thirty) days. Patient takes twice a month       STOP taking these medications     amLODipine (NORVASC) 10 MG tablet      aspirin EC 81 MG tablet      benazepril-hydrochlorthiazide (LOTENSIN HCT) 20-12.5 MG per tablet      meloxicam (MOBIC) 15 MG tablet         Discharge Orders    Future Orders Please Complete By Expires   Diet - low sodium heart healthy      Discharge instructions      Comments:   Hold aspirin, amlodipine, but has a peripheral/hydrochlorothiazide for one week, then resume if no further bleeding.   Activity as tolerated - No restrictions      Driving Restrictions      Comments:   Do not drive while on pain medication   Call MD for:  persistant nausea and vomiting      Call MD for:  severe uncontrolled pain         Follow-up Information    Follow up with Jonette Eva, MD in 4 weeks.   Contact information:   [address]  [address] [place] [address] [phone]     for colonoscopy.       Disposition: Home  Discharged Condition: Stable  Consults: Treatment Team:  Arlyce Harman, MD  Labs:   Results for orders placed during the hospital encounter of 01/06/11 (from the past 48 hour(s))  CBC     Status: Normal   Collection Time   01/06/11  1:30 PM      Component Value Range Comment   WBC 10.2  4.0 - 10.5 (K/uL)    RBC 4.40  3.87 - 5.11 (MIL/uL)    Hemoglobin 13.5  12.0 - 15.0 (g/dL)    HCT 91.4  78.2 - 95.6 (%)    MCV 93.0  78.0 - 100.0 (fL)    MCH 30.7  26.0 - 34.0 (pg)    MCHC 33.0  30.0 - 36.0 (g/dL)    RDW 21.3  08.6 - 57.8 (%)    Platelets 216  150 - 400 (K/uL)   DIFFERENTIAL     Status: Normal   Collection Time   01/06/11  1:30 PM      Component Value Range Comment   Neutrophils Relative 65  43 - 77 (%)    Neutro Abs 6.6  1.7 - 7.7  (K/uL)    Lymphocytes Relative 25  12 - 46 (%)    Lymphs Abs 2.6  0.7 - 4.0 (K/uL)    Monocytes Relative 9  3 - 12 (%)    Monocytes Absolute 0.9  0.1 - 1.0 (K/uL)    Eosinophils Relative 1  0 - 5 (%)    Eosinophils Absolute 0.1  0.0 - 0.7 (K/uL)    Basophils Relative 0  0 - 1 (%)    Basophils Absolute 0.0  0.0 - 0.1 (K/uL)   COMPREHENSIVE METABOLIC PANEL     Status: Abnormal   Collection Time   01/06/11  1:30 PM      Component Value Range Comment   Sodium 138  135 - 145 (mEq/L)    Potassium 3.4 (*) 3.5 - 5.1 (mEq/L)    Chloride 102  96 - 112 (mEq/L)    CO2 27  19 - 32 (mEq/L)    Glucose, Bld 100 (*) 70 - 99 (mg/dL)    BUN 10  6 - 23 (mg/dL)    Creatinine, Ser 4.69  0.50 - 1.10 (mg/dL)    Calcium 62.9  8.4 - 10.5 (mg/dL)    Total Protein 8.2  6.0 - 8.3 (g/dL)    Albumin 4.2  3.5 - 5.2 (g/dL)    AST 19  0 - 37 (U/L)    ALT 18  0 - 35 (U/L)    Alkaline Phosphatase 100  39 - 117 (U/L)    Total Bilirubin 0.4  0.3 - 1.2 (mg/dL)    GFR calc non Af Amer 62 (*) >90 (mL/min)    GFR calc Af Amer 72 (*) >90 (mL/min)   PROTIME-INR     Status: Normal   Collection Time   01/06/11  1:30 PM      Component Value Range Comment   Prothrombin Time 12.8  11.6 - 15.2 (seconds)    INR 0.94  0.00 - 1.49    APTT     Status: Normal   Collection Time   01/06/11  1:30 PM      Component Value Range Comment   aPTT 33  24 - 37 (seconds)   TYPE AND SCREEN     Status: Normal  Collection Time   01/06/11  1:35 PM      Component Value Range Comment   ABO/RH(D) O POS      Antibody Screen NEG      Sample Expiration 01/09/2011     URINALYSIS, ROUTINE W REFLEX MICROSCOPIC     Status: Abnormal   Collection Time   01/06/11  3:00 PM      Component Value Range Comment   Color, Urine YELLOW  YELLOW     APPearance CLEAR  CLEAR     Specific Gravity, Urine 1.015  1.005 - 1.030     pH 6.0  5.0 - 8.0     Glucose, UA NEGATIVE  NEGATIVE (mg/dL)    Hgb urine dipstick NEGATIVE  NEGATIVE     Bilirubin Urine  NEGATIVE  NEGATIVE     Ketones, ur NEGATIVE  NEGATIVE (mg/dL)    Protein, ur NEGATIVE  NEGATIVE (mg/dL)    Urobilinogen, UA 0.2  0.0 - 1.0 (mg/dL)    Nitrite NEGATIVE  NEGATIVE     Leukocytes, UA TRACE (*) NEGATIVE    URINE MICROSCOPIC-ADD ON     Status: Abnormal   Collection Time   01/06/11  3:00 PM      Component Value Range Comment   WBC, UA 3-6  <3 (WBC/hpf)    RBC / HPF 3-6  <3 (RBC/hpf)    Bacteria, UA FEW (*) RARE     Urine-Other MUCOUS PRESENT     STOOL CULTURE     Status: Normal (Preliminary result)   Collection Time   01/06/11  4:59 PM      Component Value Range Comment   Specimen Description STOOL      Special Requests NONE      Culture Culture reincubated for better growth      Report Status PENDING     CLOSTRIDIUM DIFFICILE BY PCR     Status: Normal   Collection Time   01/06/11  5:29 PM      Component Value Range Comment   C difficile by pcr NEGATIVE  NEGATIVE    BASIC METABOLIC PANEL     Status: Abnormal   Collection Time   01/07/11  6:44 AM      Component Value Range Comment   Sodium 142  135 - 145 (mEq/L)    Potassium 4.1  3.5 - 5.1 (mEq/L) DELTA CHECK NOTED   Chloride 109  96 - 112 (mEq/L)    CO2 28  19 - 32 (mEq/L)    Glucose, Bld 111 (*) 70 - 99 (mg/dL)    BUN 6  6 - 23 (mg/dL)    Creatinine, Ser 4.09  0.50 - 1.10 (mg/dL)    Calcium 9.3  8.4 - 10.5 (mg/dL)    GFR calc non Af Amer 60 (*) >90 (mL/min)    GFR calc Af Amer 69 (*) >90 (mL/min)   CBC     Status: Abnormal   Collection Time   01/07/11  6:44 AM      Component Value Range Comment   WBC 7.0  4.0 - 10.5 (K/uL)    RBC 3.78 (*) 3.87 - 5.11 (MIL/uL)    Hemoglobin 11.5 (*) 12.0 - 15.0 (g/dL)    HCT 81.1 (*) 91.4 - 46.0 (%)    MCV 93.7  78.0 - 100.0 (fL)    MCH 30.4  26.0 - 34.0 (pg)    MCHC 32.5  30.0 - 36.0 (g/dL)    RDW 78.2  95.6 - 21.3 (%)  Platelets 199  150 - 400 (K/uL)     Diagnostics:  Ct Abdomen Pelvis Wo Contrast  01/06/2011  *RADIOLOGY REPORT*  Clinical Data: Rectal bleeding.   CT ABDOMEN AND PELVIS WITHOUT CONTRAST  Technique:  Multidetector CT imaging of the abdomen and pelvis was performed following the standard protocol without intravenous contrast.  Comparison: None  Findings: The lung bases are clear.  The unenhanced appearance of the liver is unremarkable.  No focal lesions or biliary dilatation.  The gallbladder appears normal.  No common bile duct dilatation.  The pancreas is normal.  The spleen is normal in size.  No focal lesions.  The adrenal glands are unremarkable.  There is a pelvic left kidney.  The right kidney demonstrates a simple appearing cyst.  The stomach is not well descend with contrast but no gross abnormalities are seen.  The duodenum and small bowel are normal except for a few thickened small bowel loops in the right pelvis. The appendix is normal.  There is diffuse colonic wall thickening involving the descending colon with some surrounding pericolonic inflammatory changes.  No discrete lesion or findings for diverticulitis.  The sigmoid colon is unremarkable and the rectum appears normal.  The uterus and left ovary appear normal.  I do not see the right ovary for certain.  The bladder is grossly normal.  No pelvic mass, adenopathy or free pelvic fluid collection.  A the aorta is normal in caliber.  Mild atherosclerotic changes.  IMPRESSION:  1.  Diffuse wall thickening and pericolonic inflammation involving the descending colon consistent with inflammatory or infectious colitis. 2.  Mild wall thickening involving a few distal small bowel loops. 3.  Pelvic left kidney.  Original Report Authenticated By: P. Loralie Champagne, M.D.    Procedures: None  Full Code   Hospital Course: See H&P for complete admission details. The patient is a 70 year old black female who presented with left lower quadrant pain and bloody diarrhea. She had a CAT scan which showed descending colitis. No evidence of diverticulitis. She had a mild leukocytosis and mild left lower  quadrant tenderness. Her vital signs were normal. She was admitted to the hospitalist service and started on empiric Cipro and Flagyl. Clear liquids and pain medication. Stool for C. difficile was negative. Stool culture and ova and parasite exam are pending. However, patient's bleeding stopped within 24 hours. Her pain is improved and she remains medically stable. Her diet was advanced. She was evaluated by Dr. Darrick Penna at all who recommended outpatient colonoscopy. Her blood pressure was labile and I've asked her to hold her antihypertensives for a week then resume. I've also asked her to hold her aspirin for a week. Total time on the day of discharge is greater than 30 minutes.  Discharge Exam:  Blood pressure 91/53, pulse 60, temperature 98.4 F (36.9 C), temperature source Oral, resp. rate 18, height 5\' 2"  (1.575 m), weight 58.6 kg (129 lb 3 oz), SpO2 98.00%. Unchanged from 01/07/2011   Signed: Crista Curb L 01/08/2011, 11:13 AM

## 2011-01-08 NOTE — Consult Note (Signed)
Referring Provider: Dr. Nani Skillern ([facility] Hospitalist) Primary Care Physician:  Ninfa Linden, NP Lewayne Bunting) Primary Gastroenterologist:  Dr. Darrick Penna Previous GI: Dr Allena Katz  Reason for Consultation:  Colitis  HPI: Kelly Hebert is a 70 y.o. female with diarrhea that started 2am 3 days ago.  Had diarrhea 2 episodes the next day, & she saw deep red blood on toilet paper in a large amt (no clots).  Continued to have small amts of bleeding w/ wiping.  No diarrhea since Sat (2d ago).  C/o pain bilat lower cramps "like a period".  Pain is better now.  CT -> Diffuse wall thickening and pericolonic inflammation involving the descending colon consistent with inflammatory or infectious colitis.  Mild wall thickening involving a few distal small bowel loops.  Pelvic left kidney.  She has been started on empiric cipro & flagyl.  No ill contacts.  Went to [facility] recently, but no foreign travel.  + well water.  Recent antibiotics 3 months ago for UTI.  Denies fever. +Chills. Denies nausea or vomiting.  C/o anorexia x 2 weeks.  Weight loss 10# in past 3 mo.    C/o acid reflux daily & has been prilosec 20mg  daily.  Recently decreased from BID dosing to daily in Sept 2012.  Pt thinks she needs BID dosing.  C/o pill dysphagia esp w/ large pills & occasionally w/ solid foods, but denies problems w/ liquids.  Feels like stuck in mid-esophagus.  Hx esophageal dilation 10 yrs ago by Dr Allena Katz.   Past Medical History  Diagnosis Date  . Renal disorder   . Hypercholesteremia   . Hypertension   . Acid reflux   . Anxiety   . Depression   . COPD (chronic obstructive pulmonary disease)    Past Surgical History  Procedure Date  . Abdominal hysterectomy   . Cholecystectomy   . Rotary cuff repair     Prior to Admission medications   Medication Sig Start Date End Date Taking? Authorizing Provider  albuterol (PROVENTIL HFA;VENTOLIN HFA) 108 (90 BASE) MCG/ACT inhaler Inhale 2 puffs into the lungs  every 6 (six) hours as needed. Shortness of breath    Yes Historical Provider, MD  amLODipine (NORVASC) 10 MG tablet Take 10 mg by mouth daily.     Yes Historical Provider, MD  aspirin EC 81 MG tablet Take 81 mg by mouth daily.     Yes Historical Provider, MD  benazepril-hydrochlorthiazide (LOTENSIN HCT) 20-12.5 MG per tablet Take 2 tablets by mouth daily.     Yes Historical Provider, MD  citalopram (CELEXA) 40 MG tablet Take 40 mg by mouth daily.     Yes Historical Provider, MD  fluticasone (FLONASE) 50 MCG/ACT nasal spray Place 2 sprays into the nose daily.     Yes Historical Provider, MD  fluticasone (FLOVENT HFA) 110 MCG/ACT inhaler Inhale 1 puff into the lungs 2 (two) times daily.     Yes Historical Provider, MD  levothyroxine (SYNTHROID, LEVOTHROID) 50 MCG tablet Take 50 mcg by mouth daily.     Yes Historical Provider, MD  LORazepam (ATIVAN) 1 MG tablet Take 1 mg by mouth 2 (two) times daily.    Yes Historical Provider, MD  magnesium gluconate (MAGONATE) 500 MG tablet Take 500 mg by mouth daily.     Yes Historical Provider, MD  omeprazole (PRILOSEC) 20 MG capsule Take 20 mg by mouth daily.     Yes Historical Provider, MD  simvastatin (ZOCOR) 40 MG tablet Take 40 mg by mouth  daily.     Yes Historical Provider, MD  Vitamin D, Ergocalciferol, (DRISDOL) 50000 UNITS CAPS Take 50,000 Units by mouth every 30 (thirty) days. Patient takes twice a month    Yes Historical Provider, MD    Current Facility-Administered Medications  Medication Dose Route Frequency Provider Last Rate Last Dose  . acetaminophen (TYLENOL) tablet 650 mg  650 mg Oral Q6H PRN Christiane Ha      . albuterol (PROVENTIL HFA;VENTOLIN HFA) 108 (90 BASE) MCG/ACT inhaler 2 puff  2 puff Inhalation Q6H PRN Corinna Burman Freestone      . alum & mag hydroxide-simeth (MAALOX/MYLANTA) 200-200-20 MG/5ML suspension 30 mL  30 mL Oral TID PRN Francisco J Valls, PHARMD      . ciprofloxacin (CIPRO) tablet 500 mg  500 mg Oral BID Corinna L  Sullivan   500 mg at 01/08/11 0915  . citalopram (CELEXA) tablet 40 mg  40 mg Oral Daily Corinna L Sullivan   40 mg at 01/08/11 0915  . fluticasone (FLOVENT HFA) 110 MCG/ACT inhaler 1 puff  1 puff Inhalation BID Corinna L Sullivan   1 puff at 01/08/11 0820  . HYDROcodone-acetaminophen (NORCO) 5-325 MG per tablet 1-2 tablet  1-2 tablet Oral Q4H PRN Christiane Ha   1 tablet at 01/08/11 0919  . levothyroxine (SYNTHROID, LEVOTHROID) tablet 50 mcg  50 mcg Oral QAC breakfast Corinna L Sullivan   50 mcg at 01/08/11 0915  . LORazepam (ATIVAN) tablet 1 mg  1 mg Oral BID Corinna L Sullivan   1 mg at 01/08/11 0915  . metroNIDAZOLE (FLAGYL) tablet 500 mg  500 mg Oral Q8H Corinna L Sullivan   500 mg at 01/08/11 0541  . morphine 2 MG/ML injection 2 mg  2 mg Intravenous Q3H PRN Christiane Ha      . ondansetron (ZOFRAN) tablet 4 mg  4 mg Oral Q6H PRN Corinna L Sullivan       Or  . ondansetron (ZOFRAN) injection 4 mg  4 mg Intravenous Q6H PRN Corinna L Sullivan      . pantoprazole (PROTONIX) EC tablet 40 mg  40 mg Oral Q1200 Leopold Campbell   40 mg at 01/07/11 1217  . simvastatin (ZOCOR) tablet 40 mg  40 mg Oral q1800 Corinna L Sullivan   40 mg at 01/07/11 1755  . traZODone (DESYREL) tablet 50 mg  50 mg Oral QHS PRN Corinna L Sullivan   50 mg at 01/07/11 2214  . DISCONTD: 0.9 % NaCl with KCl 20 mEq/ L  infusion   Intravenous Continuous Corinna L Sullivan 75 mL/hr at 01/06/11 1953 1,000 mL at 01/06/11 1953  . DISCONTD: acetaminophen (TYLENOL) suppository 650 mg  650 mg Rectal Q6H PRN Christiane Ha      . DISCONTD: amLODipine (NORVASC) tablet 10 mg  10 mg Oral Daily Corinna L Sullivan   10 mg at 01/07/11 1239  . DISCONTD: benazepril (LOTENSIN) tablet 20 mg  20 mg Oral Daily Corinna L Sullivan   20 mg at 01/07/11 1239  . DISCONTD: ciprofloxacin (CIPRO) IVPB 400 mg  400 mg Intravenous Q12H Corinna L Sullivan   400 mg at 01/07/11 0601  . DISCONTD: metroNIDAZOLE (FLAGYL) IVPB 500 mg  500 mg Intravenous  Q8H Corinna L Sullivan   500 mg at 01/07/11 0946    Allergies as of 01/06/2011 - Review Complete 01/06/2011  Allergen Reaction Noted  . Ivp dye (iodinated diagnostic agents) Hives and Itching 01/06/2011    History reviewed. No pertinent family history.  History   Social History  . Marital Status: Married    Spouse Name: N/A    Number of Children: N/A  . Years of Education: N/A   Occupational History  . Not on file.   Social History Main Topics  . Smoking status: Never Smoker   . Smokeless tobacco: Not on file  . Alcohol Use: No  . Drug Use: No  . Sexually Active:    Other Topics Concern  . Not on file   Social History Narrative  . No narrative on file    Review of Systems: Gen: See history of present illness CV: Denies chest pain, angina, palpitations, syncope, orthopnea, PND, peripheral edema, and claudication. Resp: Denies dyspnea at rest, dyspnea with exercise, cough, sputum, wheezing, coughing up blood, and pleurisy. GI: Denies vomiting blood, jaundice, and fecal incontinence.   GU : Denies urinary burning, blood in urine, urinary frequency, urinary hesitancy, nocturnal urination, and urinary incontinence. MS: Denies joint pain, limitation of movement, and swelling, stiffness, low back pain, extremity pain. Denies muscle weakness, cramps, atrophy.  Derm: Denies rash, itching, dry skin, hives, moles, warts, or unhealing ulcers.  Psych: Denies depression, anxiety, memory loss, suicidal ideation, hallucinations, paranoia, and confusion. Heme: Denies bruising, bleeding, and enlarged lymph nodes. Neuro:  Denies any headaches, dizziness, paresthesias. Endo:  Denies any problems with DM, thyroid, adrenal function.  Physical Exam: Vital signs in last 24 hours: Temp:  [97.8 F (36.6 C)-98.4 F (36.9 C)] 98.4 F (36.9 C) (12/17 0503) Pulse Rate:  [60-71] 60  (12/17 0503) Resp:  [18-20] 18  (12/17 0503) BP: (91-124)/(53-75) 91/53 mmHg (12/17 0503) SpO2:  [97 %-100 %]  98 % (12/17 0826) Last BM Date: 01/06/11 General:   Alert,  Well-developed, well-nourished, pleasant and cooperative in NAD Head:  Normocephalic and atraumatic. Eyes:  Sclera clear, no icterus.   Conjunctiva pink. Ears:  Normal auditory acuity. Nose:  No deformity, discharge, or lesions. Mouth:  No deformity or lesions,oropharynx pink & moist. Neck:  Supple; no masses or thyromegaly. Lungs:  Clear throughout to auscultation.   No wheezes, crackles, or rhonchi. No acute distress. Heart:  Regular rate and rhythm; no murmurs, clicks, rubs,  or gallops. Abdomen:  Normal bowel sounds.  No bruits.  Soft, non-tender and non-distended without masses, hepatosplenomegaly or hernias noted.  No guarding or rebound tenderness.   Rectal:  Deferred. Msk:  Symmetrical withounormt gross deformities. \ Pulses:  Normal pulses noted. Extremities:  No edema. Neurologic:  Alert and oriented x4;  grossly normal neurologically. Skin:  Intact without significant lesions or rashes. Lymph Nodes:  No significant cervical adenopathy. Psych:  Alert and cooperative. Normal mood and affect.  Lab Results:  [facility] 01/07/11 0644 01/06/11 1330  WBC 7.0 10.2  HGB 11.5* 13.5  HCT 35.4* 40.9  PLT 199 216   BMET  Basename 01/07/11 0644 01/06/11 1330  NA 142 138  K 4.1 3.4*  CL 109 102  CO2 28 27  GLUCOSE 111* 100*  BUN 6 10  CREATININE 0.94 0.91  CALCIUM 9.3 10.4   LFT  Basename 01/06/11 1330  PROT 8.2  ALBUMIN 4.2  AST 19  ALT 18  ALKPHOS 100  BILITOT 0.4  BILIDIR --  IBILI --  LIPASE --  AMYLASE --   PT/INR  Basename 01/06/11 1330  LABPROT 12.8  INR 0.94   Studies/Results: Ct Abdomen Pelvis Wo Contrast  01/06/2011  *RADIOLOGY REPORT*  Clinical Data: Rectal bleeding.  CT ABDOMEN AND PELVIS WITHOUT CONTRAST  Technique:  Multidetector CT imaging of the abdomen and pelvis was performed following the standard protocol without intravenous contrast.  Comparison: None  Findings: The lung bases  are clear.  The unenhanced appearance of the liver is unremarkable.  No focal lesions or biliary dilatation.  The gallbladder appears normal.  No common bile duct dilatation.  The pancreas is normal.  The spleen is normal in size.  No focal lesions.  The adrenal glands are unremarkable.  There is a pelvic left kidney.  The right kidney demonstrates a simple appearing cyst.  The stomach is not well descend with contrast but no gross abnormalities are seen.  The duodenum and small bowel are normal except for a few thickened small bowel loops in the right pelvis. The appendix is normal.  There is diffuse colonic wall thickening involving the descending colon with some surrounding pericolonic inflammatory changes.  No discrete lesion or findings for diverticulitis.  The sigmoid colon is unremarkable and the rectum appears normal.  The uterus and left ovary appear normal.  I do not see the right ovary for certain.  The bladder is grossly normal.  No pelvic mass, adenopathy or free pelvic fluid collection.  A the aorta is normal in caliber.  Mild atherosclerotic changes.  IMPRESSION:  1.  Diffuse wall thickening and pericolonic inflammation involving the descending colon consistent with inflammatory or infectious colitis. 2.  Mild wall thickening involving a few distal small bowel loops. 3.  Pelvic left kidney.  Original Report Authenticated By: P. Loralie Champagne, M.D.    Impression: 1. Acute enteritis most likely viral vs. infectious process.  Doubt IBD or ischemia.  She has responded very well to supportive measures & empiric antibiotics.   2. Rectal bleeding: Suspect trivial. No significant drop in hemoglobin. 3. GERD: Refractory despite daily PPI. 4. Dysphagia: Mostly with solids and pills.  Plan: 1. Agree with empiric Cipro and Flagyl for 5 days 2. Resume omeprazole 20 mg twice a day 3. EGD with possible esophageal dilation and colonoscopy once acute illness is resolved. Make appointment in 2 weeks for  followup. She is instructed to call us sooner if needed for continued, abdominal pain, persistent diarrhea and was given my card. 4. Follow up on stool studies  We would like to thank you for the opportunity to participate in the care of [employer].   LOS: 2 days   Lorenza Burton  01/08/2011, 9:40 AM [facility]

## 2011-01-08 NOTE — Progress Notes (Signed)
UR Chart Review Completed  

## 2011-01-08 NOTE — Telephone Encounter (Signed)
Please call patient. She is being discharged from hospital today and needs a followup office visit in 2 weeks to set up colonoscopy and EGD with ED. Thanks

## 2011-01-09 LAB — OVA AND PARASITE EXAMINATION

## 2011-01-10 LAB — STOOL CULTURE

## 2011-01-12 NOTE — Telephone Encounter (Signed)
Done

## 2011-01-17 NOTE — Telephone Encounter (Signed)
Pt is aware of OV on 1/14 at 2pm with KJ

## 2011-02-05 ENCOUNTER — Ambulatory Visit: Payer: Medicare Other | Admitting: Urgent Care

## 2011-02-08 ENCOUNTER — Ambulatory Visit: Payer: Medicare Other | Admitting: Gastroenterology

## 2011-02-19 ENCOUNTER — Encounter: Payer: Self-pay | Admitting: Gastroenterology

## 2011-02-19 ENCOUNTER — Ambulatory Visit (INDEPENDENT_AMBULATORY_CARE_PROVIDER_SITE_OTHER): Payer: Medicare Other | Admitting: Gastroenterology

## 2011-02-19 VITALS — BP 137/75 | HR 90 | Temp 99.2°F | Ht 62.0 in | Wt 135.0 lb

## 2011-02-19 DIAGNOSIS — R1310 Dysphagia, unspecified: Secondary | ICD-10-CM

## 2011-02-19 DIAGNOSIS — K529 Noninfective gastroenteritis and colitis, unspecified: Secondary | ICD-10-CM

## 2011-02-19 DIAGNOSIS — K5289 Other specified noninfective gastroenteritis and colitis: Secondary | ICD-10-CM

## 2011-02-19 MED ORDER — SOD PICOSULFATE-MAG OX-CIT ACD 10-3.5-12 MG-GM-GM PO PACK: 1.0000 | PACK | Freq: Once | ORAL | Status: DC

## 2011-02-19 MED ORDER — ESOMEPRAZOLE MAGNESIUM 40 MG PO CPDR: 40.0000 mg | DELAYED_RELEASE_CAPSULE | Freq: Every day | ORAL | Status: DC

## 2011-02-19 MED ORDER — OMEPRAZOLE 20 MG PO CPDR: 20.0000 mg | DELAYED_RELEASE_CAPSULE | Freq: Two times a day (BID) | ORAL | Status: DC

## 2011-02-19 NOTE — Progress Notes (Signed)
Referring Provider: Ninfa Linden, NP Primary Care Physician:  Ninfa Linden, NP, NP Primary Gastroenterologist: Dr. Darrick Penna   Chief Complaint  Patient presents with  . Follow-up    HPI:   [name] returns today in hospital follow-up after admission to [facility] in December 2012. Admitted with diarrhea, hematochezia, and CT findings of diffuse wall thickening and pericolonic inflammation involving descending colon, consistent with inflammatory or infectious colitis. Treated with Cipro and Flagyl. At time of hospitalization, also reported dysphagia with pills and solid foods. Stool studies negative.   Returns today without further diarrhea, hematochezia. Noted slight LLQ discomfort a few weeks after leaving hospital but doing well since. No nausea or vomiting. Intermittent dysphagia with solids and some pills. Hx of dilation by Dr. Allena Katz, remote past. ?10 years ago. Reports reflux flares. Only taking Prilosec once per day.   Has taken Protonix and Prevacid in the past.   Past Medical History  Diagnosis Date  . Renal disorder     pelvic kidney  . Hypercholesteremia   . Hypertension   . Acid reflux   . Anxiety   . Depression   . COPD (chronic obstructive pulmonary disease)   . S/P colonoscopy 2003    Dr. Maryruth Bun: normal colon, normal path.     Past Surgical History  Procedure Date  . Abdominal hysterectomy     complete  . Cholecystectomy   . Rotator cuff repair   . Laparoscopy 2000    [place]-stomach pain-pt reports negative    Current Outpatient Prescriptions  Medication Sig Dispense Refill  . albuterol (PROVENTIL HFA;VENTOLIN HFA) 108 (90 BASE) MCG/ACT inhaler Inhale 2 puffs into the lungs every 6 (six) hours as needed. Shortness of breath       . citalopram (CELEXA) 40 MG tablet Take 40 mg by mouth daily.        . fluticasone (FLONASE) 50 MCG/ACT nasal spray Place 2 sprays into the nose daily.        . fluticasone (FLOVENT HFA) 110 MCG/ACT inhaler Inhale 1 puff into the  lungs 2 (two) times daily.        Marland Kitchen levothyroxine (SYNTHROID, LEVOTHROID) 50 MCG tablet Take 50 mcg by mouth daily.        Marland Kitchen LORazepam (ATIVAN) 1 MG tablet Take 1 mg by mouth 2 (two) times daily.       . magnesium gluconate (MAGONATE) 500 MG tablet Take 500 mg by mouth daily.        Marland Kitchen omeprazole (PRILOSEC) 20 MG capsule Take 20 mg by mouth daily.        . simvastatin (ZOCOR) 40 MG tablet Take 40 mg by mouth daily.        . Vitamin D, Ergocalciferol, (DRISDOL) 50000 UNITS CAPS Take 50,000 Units by mouth every 30 (thirty) days. Patient takes twice a month         Allergies as of 02/19/2011 - Review Complete 02/19/2011  Allergen Reaction Noted  . Ivp dye (iodinated diagnostic agents) Hives and Itching 01/06/2011    Family History  Problem Relation Age of Onset  . Kidney disease Sister   . Cirrhosis Sister     Non-etoh  . Cirrhosis Brother     ?etoh  . Colon cancer Neg Hx     History   Social History  . Marital Status: Married    Spouse Name: N/A    Number of Children: 3  . Years of Education: N/A   Occupational History  . retired Teacher, early years/pre  History Main Topics  . Smoking status: Never Smoker   . Smokeless tobacco: None  . Alcohol Use: No  . Drug Use: No  . Sexually Active: None   Other Topics Concern  . None   Social History Narrative  . None    Review of Systems: Gen: Denies fever, chills, anorexia. Denies fatigue, weakness, weight loss.  CV: Denies chest pain, palpitations, syncope, peripheral edema, and claudication. Resp: Denies dyspnea at rest, cough, wheezing, coughing up blood, and pleurisy. GI: SEE HPI Derm: Denies rash, itching, dry skin Psych: Denies depression, anxiety, memory loss, confusion. No homicidal or suicidal ideation.  Heme: Denies bruising, bleeding, and enlarged lymph nodes.  Physical Exam: BP 137/75  Pulse 90  Temp(Src) 99.2 F (37.3 C) (Temporal)  Ht 5\' 2"  (1.575 m)  Wt 135 lb (61.236 kg)  BMI 24.69 kg/m2 General:   Alert  and oriented. No distress noted. Pleasant and cooperative.  Head:  Normocephalic and atraumatic. Eyes:  Conjuctiva clear without scleral icterus. Mouth:  Oral mucosa pink and moist. Good dentition. No lesions. Neck:  Supple, without mass or thyromegaly. Heart:  S1, S2 present without murmurs, rubs, or gallops. Regular rate and rhythm. Abdomen:  +BS, soft, non-tender and non-distended. No rebound or guarding. No HSM or masses noted. Msk:  Symmetrical without gross deformities. Normal posture. Pulses:  2+ DP noted bilaterally Extremities:  Without edema. Neurologic:  Alert and  oriented x4;  grossly normal neurologically. Skin:  Intact without significant lesions or rashes. Cervical Nodes:  No significant cervical adenopathy. Psych:  Alert and cooperative. Normal mood and affect.

## 2011-02-19 NOTE — Patient Instructions (Signed)
Stop taking Prilosec. Start taking Nexium 30 minutes before breakfast daily. Samples have been provided and a prescription sent to your pharmacy.  We have set you up for an upper endoscopy and colonoscopy with Dr. Darrick Penna in the near future.   Please review the reflux diet in the meantime.

## 2011-02-19 NOTE — Assessment & Plan Note (Signed)
Remote hx of esophageal dilation in the past per pt, at least 10 years ago (Dr. Allena Katz?). Reports solid food and pill dysphagia intermittently, increased GERD. Likely due to benign etiology such as esophageal web, ring, or stricture. Question uncontrolled GERD as well. Low likelihood of malignancy.   Switch from Prilosec to Nexium daily. (Prilosec could potentiate effects of Celexa) Proceed with upper endoscopy/dilation in the near future with Dr. Darrick Penna. The risks, benefits, and alternatives have been discussed in detail with patient. They have stated understanding and desire to proceed.  GERD diet provided

## 2011-02-19 NOTE — Assessment & Plan Note (Signed)
71 year old presenting in hospital follow-up after acute onset of diarrhea, hematochezia, and CT findings of  inflammatory vs infectious colitis. Stool studies negative. Completed course of abx, returns today without any further diarrhea, hematochezia. Last TCS at outside facility in 2003, needs updated TCS to assess for any underlying etiology.   Proceed with colonoscopy with Dr. Darrick Penna in the near future. The risks, benefits, and alternatives have been discussed in detail with the patient. They state understanding and desire to proceed.  EGD/ED to be performed at time of TCS, see "dysphagia".

## 2011-02-20 NOTE — Progress Notes (Signed)
Faxed to PCP

## 2011-02-22 NOTE — Progress Notes (Signed)
REVIEWED.  

## 2011-03-08 MED ORDER — SODIUM CHLORIDE 0.45 % IV SOLN
Freq: Once | INTRAVENOUS | Status: AC
  Administered 2011-03-09: 11:00:00 via INTRAVENOUS

## 2011-03-09 ENCOUNTER — Encounter (HOSPITAL_COMMUNITY)
Admission: RE | Disposition: A | Discharge status: Home / Self Care | Payer: Self-pay | Source: Ambulatory Visit | Attending: Gastroenterology

## 2011-03-09 ENCOUNTER — Encounter (HOSPITAL_COMMUNITY): Payer: Self-pay | Admitting: *Deleted

## 2011-03-09 ENCOUNTER — Other Ambulatory Visit: Payer: Self-pay | Admitting: Gastroenterology

## 2011-03-09 ENCOUNTER — Ambulatory Visit (HOSPITAL_COMMUNITY)
Admission: RE | Admit: 2011-03-09 | Discharge: 2011-03-09 | Disposition: A | Discharge status: Home / Self Care | Payer: Medicare Other | Source: Ambulatory Visit | Attending: Gastroenterology | Admitting: Gastroenterology

## 2011-03-09 DIAGNOSIS — R1310 Dysphagia, unspecified: Secondary | ICD-10-CM

## 2011-03-09 DIAGNOSIS — I10 Essential (primary) hypertension: Secondary | ICD-10-CM | POA: Insufficient documentation

## 2011-03-09 DIAGNOSIS — J4489 Other specified chronic obstructive pulmonary disease: Secondary | ICD-10-CM | POA: Insufficient documentation

## 2011-03-09 DIAGNOSIS — K2990 Gastroduodenitis, unspecified, without bleeding: Secondary | ICD-10-CM

## 2011-03-09 DIAGNOSIS — K222 Esophageal obstruction: Secondary | ICD-10-CM | POA: Insufficient documentation

## 2011-03-09 DIAGNOSIS — J449 Chronic obstructive pulmonary disease, unspecified: Secondary | ICD-10-CM | POA: Insufficient documentation

## 2011-03-09 DIAGNOSIS — K529 Noninfective gastroenteritis and colitis, unspecified: Secondary | ICD-10-CM

## 2011-03-09 DIAGNOSIS — K311 Adult hypertrophic pyloric stenosis: Secondary | ICD-10-CM | POA: Insufficient documentation

## 2011-03-09 DIAGNOSIS — Z79899 Other long term (current) drug therapy: Secondary | ICD-10-CM | POA: Insufficient documentation

## 2011-03-09 DIAGNOSIS — K2970 Gastritis, unspecified, without bleeding: Secondary | ICD-10-CM

## 2011-03-09 DIAGNOSIS — K5730 Diverticulosis of large intestine without perforation or abscess without bleeding: Secondary | ICD-10-CM | POA: Insufficient documentation

## 2011-03-09 DIAGNOSIS — Z1211 Encounter for screening for malignant neoplasm of colon: Secondary | ICD-10-CM | POA: Insufficient documentation

## 2011-03-09 DIAGNOSIS — E7800 Pure hypercholesterolemia, unspecified: Secondary | ICD-10-CM | POA: Insufficient documentation

## 2011-03-09 DIAGNOSIS — K449 Diaphragmatic hernia without obstruction or gangrene: Secondary | ICD-10-CM | POA: Insufficient documentation

## 2011-03-09 DIAGNOSIS — K648 Other hemorrhoids: Secondary | ICD-10-CM | POA: Insufficient documentation

## 2011-03-09 DIAGNOSIS — K2940 Chronic atrophic gastritis without bleeding: Secondary | ICD-10-CM | POA: Insufficient documentation

## 2011-03-09 HISTORY — PX: COLONOSCOPY: SHX5424

## 2011-03-09 HISTORY — PX: ESOPHAGOGASTRODUODENOSCOPY: SHX1529

## 2011-03-09 SURGERY — COLONOSCOPY
Anesthesia: Moderate Sedation

## 2011-03-09 MED ORDER — STERILE WATER FOR IRRIGATION IR SOLN
Status: DC | PRN
  Administered 2011-03-09: 11:00:00

## 2011-03-09 MED ORDER — MEPERIDINE HCL 100 MG/ML IJ SOLN
INTRAMUSCULAR | Status: AC
  Filled 2011-03-09: qty 2

## 2011-03-09 MED ORDER — MINERAL OIL PO OIL
TOPICAL_OIL | ORAL | Status: AC
  Filled 2011-03-09: qty 30

## 2011-03-09 MED ORDER — MEPERIDINE HCL 100 MG/ML IJ SOLN
INTRAMUSCULAR | Status: DC | PRN
  Administered 2011-03-09: 25 mg via INTRAVENOUS
  Administered 2011-03-09: 50 mg via INTRAVENOUS
  Administered 2011-03-09: 25 mg via INTRAVENOUS

## 2011-03-09 MED ORDER — MIDAZOLAM HCL 5 MG/5ML IJ SOLN
INTRAMUSCULAR | Status: AC
  Filled 2011-03-09: qty 10

## 2011-03-09 MED ORDER — MIDAZOLAM HCL 5 MG/5ML IJ SOLN
INTRAMUSCULAR | Status: DC | PRN
  Administered 2011-03-09: 1 mg via INTRAVENOUS
  Administered 2011-03-09: 2 mg via INTRAVENOUS
  Administered 2011-03-09: 1 mg via INTRAVENOUS
  Administered 2011-03-09: 2 mg via INTRAVENOUS

## 2011-03-09 MED ORDER — BUTAMBEN-TETRACAINE-BENZOCAINE 2-2-14 % EX AERO
INHALATION_SPRAY | CUTANEOUS | Status: DC | PRN
  Administered 2011-03-09: 2 via TOPICAL

## 2011-03-09 NOTE — Interval H&P Note (Signed)
History and Physical Interval Note:  03/09/2011 11:06 AM  Kelly Hebert  has presented today for surgery, with the diagnosis of colitis and dysphagia  The various methods of treatment have been discussed with the patient and family. After consideration of risks, benefits and other options for treatment, the patient has consented to  Procedure(s) (LRB): COLONOSCOPY (N/A) ESOPHAGOGASTRODUODENOSCOPY (EGD) WITH ESOPHAGEAL DILATION (N/A) as a surgical intervention .  The patients' history has been reviewed, patient examined, no change in status, stable for surgery.  I have reviewed the patients' chart and labs.  Questions were answered to the patient's satisfaction.     [employer]

## 2011-03-09 NOTE — OR Nursing (Signed)
States she takes a blood pressure pill , which is new and she don't know the name

## 2011-03-09 NOTE — Op Note (Signed)
[facility] [address] [place], [place]  [address]  DR. FIELD'S EGDdTCS PROCEDURE REPORT  PATIENT:  Kelly, Hebert  MR#:  [id number] BIRTHDATE:  05/20/1940, 70 yrs. old  GENDER:  female  ENDOSCOPIST:  Jonette Eva, MD ASSISTANT: Referred by:  Ninfa Linden, NP  PROCEDURE DATE:  03/09/2011 PROCEDURE:  EGD with balloon dilatation, EGD with dilatation over guidewire, EGD with biopsy, COLONOSCOPY ASA CLASS: INDICATIONS:  DYSPHAGIA SCREENING COLITIS RESOLVED  MEDICATIONS:   Demerol 100 mg IV, Versed 6 mg IV TOPICAL ANESTHETIC:  Cetacaine Spray  DESCRIPTION OF PROCEDURE:   After the risks benefits and alternatives of the procedure were thoroughly explained, informed consent was obtained.  The EC-3890Li ([id number]) and EG-[id number] ([id number]) endoscope was introduced through the mouth and advanced to the second portion of the duodenum.  The instrument was slowly withdrawn as the mucosa was carefully examined.  Prior to withdrawal of the scope, the guidwire was placed.  The esophagus was dilated successfully.  After administration of sedation and rectal exam, the patient's rectum was intubated and the EC-3890Li ([id number]) and EG-2990i ([id number]) colonoscope was advanced under direct visualization to the CECUM.  The scope was removed slowly by carefully examining the color, texture, anatomy, and integrity mucosa on the way out. The patient was recovered in endoscopy and discharged home in satisfactory condition.  The patient was recovered in endoscopy and discharged home in satisfactory condition.  <<PROCEDUREIMAGES>>  A stricture was found in the distal esophagus AND NARROWED THE LUMEN TO 10-11 MM.  UNABLE TO PASS DIAGNOSTIC GASTROSCOPE THRU THE PYLORUS DUE TO stenosis at the pylorus AND DILATION WITH A TTS BALLOON WAS PERFORMED FROM 10 M TO .  Mild gastritis was found & BIOPSIED VIA COLD FORCEPS.  2-3 CM A hiatal hernia was found. Dilation was then performed at the  distal esophagus. 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 OTHERISE NL COLON W/O POLYPS, MASSES, OR INFAMMATORY CHANGES. SMALLINTERAL HEMORRHOIDS.  1) Dilator:  [place] over guidewire  Size(s):  11-12. 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 [facility] JUL 2013-REASSESS NEED FOR ASA & DYSPHAGIA EGD/DIL FEB 26 TO COMPLETE DILATION SOFT MECH DIET. ADVANCE TO HIGH FIBER ONCE DILATION COMPLETE. TCS IN 10-15 YEARS IF BENEFITS OUTWEIGH THE RISKS  REPEAT EXAM:  No  ______________________________ Jonette Eva, MD  CC:  n. eSIGNED:   Emile Ringgenberg at 03/09/2011 01:12 PM  Marlane Hatcher, [phone]

## 2011-03-09 NOTE — H&P (View-Only) (Signed)
Referring Provider: Patterson, Kathy, NP Primary Care Physician:  PATTERSON,KATHY, NP, NP Primary Gastroenterologist: Dr. Fields   Chief Complaint  Patient presents with  . Follow-up    HPI:   Kelly Hebert returns today in hospital follow-up after admission to [facility] in December 2012. Admitted with diarrhea, hematochezia, and CT findings of diffuse wall thickening and pericolonic inflammation involving descending colon, consistent with inflammatory or infectious colitis. Treated with Cipro and Flagyl. At time of hospitalization, also reported dysphagia with pills and solid foods. Stool studies negative.   Returns today without further diarrhea, hematochezia. Noted slight LLQ discomfort a few weeks after leaving hospital but doing well since. No nausea or vomiting. Intermittent dysphagia with solids and some pills. Hx of dilation by Dr. Patel, remote past. ?10 years ago. Reports reflux flares. Only taking Prilosec once per day.   Has taken Protonix and Prevacid in the past.   Past Medical History  Diagnosis Date  . Renal disorder     pelvic kidney  . Hypercholesteremia   . Hypertension   . Acid reflux   . Anxiety   . Depression   . COPD (chronic obstructive pulmonary disease)   . S/P colonoscopy 2003    Dr. Kapur: normal colon, normal path.     Past Surgical History  Procedure Date  . Abdominal hysterectomy     complete  . Cholecystectomy   . Rotator cuff repair   . Laparoscopy 2000    [place]-stomach pain-pt reports negative    Current Outpatient Prescriptions  Medication Sig Dispense Refill  . albuterol (PROVENTIL HFA;VENTOLIN HFA) 108 (90 BASE) MCG/ACT inhaler Inhale 2 puffs into the lungs every 6 (six) hours as needed. Shortness of breath       . citalopram (CELEXA) 40 MG tablet Take 40 mg by mouth daily.        . fluticasone (FLONASE) 50 MCG/ACT nasal spray Place 2 sprays into the nose daily.        . fluticasone (FLOVENT HFA) 110 MCG/ACT inhaler Inhale 1 puff into the  lungs 2 (two) times daily.        . levothyroxine (SYNTHROID, LEVOTHROID) 50 MCG tablet Take 50 mcg by mouth daily.        . LORazepam (ATIVAN) 1 MG tablet Take 1 mg by mouth 2 (two) times daily.       . magnesium gluconate (MAGONATE) 500 MG tablet Take 500 mg by mouth daily.        . omeprazole (PRILOSEC) 20 MG capsule Take 20 mg by mouth daily.        . simvastatin (ZOCOR) 40 MG tablet Take 40 mg by mouth daily.        . Vitamin D, Ergocalciferol, (DRISDOL) 50000 UNITS CAPS Take 50,000 Units by mouth every 30 (thirty) days. Patient takes twice a month         Allergies as of 02/19/2011 - Review Complete 02/19/2011  Allergen Reaction Noted  . Ivp dye (iodinated diagnostic agents) Hives and Itching 01/06/2011    Family History  Problem Relation Age of Onset  . Kidney disease Sister   . Cirrhosis Sister     Non-etoh  . Cirrhosis Brother     ?etoh  . Colon cancer Neg Hx     History   Social History  . Marital Status: Married    Spouse Name: N/A    Number of Children: 3  . Years of Education: N/A   Occupational History  . retired Textiles    Social   History Main Topics  . Smoking status: Never Smoker   . Smokeless tobacco: None  . Alcohol Use: No  . Drug Use: No  . Sexually Active: None   Other Topics Concern  . None   Social History Narrative  . None    Review of Systems: Gen: Denies fever, chills, anorexia. Denies fatigue, weakness, weight loss.  CV: Denies chest pain, palpitations, syncope, peripheral edema, and claudication. Resp: Denies dyspnea at rest, cough, wheezing, coughing up blood, and pleurisy. GI: SEE HPI Derm: Denies rash, itching, dry skin Psych: Denies depression, anxiety, memory loss, confusion. No homicidal or suicidal ideation.  Heme: Denies bruising, bleeding, and enlarged lymph nodes.  Physical Exam: BP 137/75  Pulse 90  Temp(Src) 99.2 F (37.3 C) (Temporal)  Ht 5' 2" (1.575 m)  Wt 135 lb (61.236 kg)  BMI 24.69 kg/m2 General:   Alert  and oriented. No distress noted. Pleasant and cooperative.  Head:  Normocephalic and atraumatic. Eyes:  Conjuctiva clear without scleral icterus. Mouth:  Oral mucosa pink and moist. Good dentition. No lesions. Neck:  Supple, without mass or thyromegaly. Heart:  S1, S2 present without murmurs, rubs, or gallops. Regular rate and rhythm. Abdomen:  +BS, soft, non-tender and non-distended. No rebound or guarding. No HSM or masses noted. Msk:  Symmetrical without gross deformities. Normal posture. Pulses:  2+ DP noted bilaterally Extremities:  Without edema. Neurologic:  Alert and  oriented x4;  grossly normal neurologically. Skin:  Intact without significant lesions or rashes. Cervical Nodes:  No significant cervical adenopathy. Psych:  Alert and cooperative. Normal mood and affect.  

## 2011-03-09 NOTE — OR Nursing (Signed)
Took B/P pill at 0700 with a sip of water

## 2011-03-12 ENCOUNTER — Other Ambulatory Visit: Payer: Self-pay | Admitting: Gastroenterology

## 2011-03-12 ENCOUNTER — Telehealth: Payer: Self-pay | Admitting: Gastroenterology

## 2011-03-12 NOTE — Telephone Encounter (Signed)
Message copied by Irish Elders on Mon Mar 12, 2011  8:48 AM ------      Message from: [place]      Created: Fri Mar 09, 2011 12:24 PM       EGD/DIL FEB 26

## 2011-03-12 NOTE — Telephone Encounter (Signed)
EGD/ED SCHEDULED FOR 02/26- INSTRUCTIONS MAILED

## 2011-03-15 ENCOUNTER — Encounter (HOSPITAL_COMMUNITY): Payer: Self-pay | Admitting: Gastroenterology

## 2011-03-16 ENCOUNTER — Telehealth: Payer: Self-pay | Admitting: Gastroenterology

## 2011-03-16 NOTE — Telephone Encounter (Signed)
Please call pt. HER stomach Bx shows mild gastritis.   CONTINUE NEXIUM. AVOID ITEMS THAT TRIGGER GASTRITIS. HOLD ASPIRIN UNLESS IT'S MEDICALLY NECESSARY. FOLLOW A SOFT MECHANICAL DIET. MEATS CHOPPED OR GROUND ONLY.  REPEAT UPPER ENDOSCOPY/DILATION ON FEB 26.

## 2011-03-19 ENCOUNTER — Encounter (HOSPITAL_COMMUNITY): Payer: Self-pay | Admitting: Pharmacy Technician

## 2011-03-19 MED ORDER — SODIUM CHLORIDE 0.45 % IV SOLN
Freq: Once | INTRAVENOUS | Status: AC
  Administered 2011-03-20: 13:00:00 via INTRAVENOUS

## 2011-03-20 ENCOUNTER — Encounter (HOSPITAL_COMMUNITY): Payer: Self-pay | Admitting: *Deleted

## 2011-03-20 ENCOUNTER — Ambulatory Visit (HOSPITAL_COMMUNITY)
Admission: RE | Admit: 2011-03-20 | Discharge: 2011-03-20 | Disposition: A | Discharge status: Home / Self Care | Payer: Medicare Other | Source: Ambulatory Visit | Attending: Gastroenterology | Admitting: Gastroenterology

## 2011-03-20 ENCOUNTER — Ambulatory Visit (HOSPITAL_COMMUNITY): Payer: Medicare Other

## 2011-03-20 ENCOUNTER — Encounter (HOSPITAL_COMMUNITY)
Admission: RE | Disposition: A | Discharge status: Home / Self Care | Payer: Self-pay | Source: Ambulatory Visit | Attending: Gastroenterology

## 2011-03-20 DIAGNOSIS — R1310 Dysphagia, unspecified: Secondary | ICD-10-CM

## 2011-03-20 DIAGNOSIS — K311 Adult hypertrophic pyloric stenosis: Secondary | ICD-10-CM | POA: Insufficient documentation

## 2011-03-20 DIAGNOSIS — K222 Esophageal obstruction: Secondary | ICD-10-CM

## 2011-03-20 DIAGNOSIS — J4489 Other specified chronic obstructive pulmonary disease: Secondary | ICD-10-CM | POA: Insufficient documentation

## 2011-03-20 DIAGNOSIS — E785 Hyperlipidemia, unspecified: Secondary | ICD-10-CM | POA: Insufficient documentation

## 2011-03-20 DIAGNOSIS — J449 Chronic obstructive pulmonary disease, unspecified: Secondary | ICD-10-CM | POA: Insufficient documentation

## 2011-03-20 DIAGNOSIS — R1013 Epigastric pain: Secondary | ICD-10-CM

## 2011-03-20 DIAGNOSIS — I10 Essential (primary) hypertension: Secondary | ICD-10-CM | POA: Insufficient documentation

## 2011-03-20 HISTORY — PX: ESOPHAGOGASTRODUODENOSCOPY: SHX1529

## 2011-03-20 SURGERY — ESOPHAGOGASTRODUODENOSCOPY (EGD) WITH ESOPHAGEAL DILATION
Anesthesia: Moderate Sedation

## 2011-03-20 MED ORDER — MEPERIDINE HCL 100 MG/ML IJ SOLN
INTRAMUSCULAR | Status: DC | PRN
  Administered 2011-03-20 (×2): 25 mg via INTRAVENOUS

## 2011-03-20 MED ORDER — MIDAZOLAM HCL 5 MG/5ML IJ SOLN
INTRAMUSCULAR | Status: AC
  Filled 2011-03-20: qty 10

## 2011-03-20 MED ORDER — STERILE WATER FOR IRRIGATION IR SOLN
Status: DC | PRN
  Administered 2011-03-20: 13:00:00

## 2011-03-20 MED ORDER — MEPERIDINE HCL 100 MG/ML IJ SOLN
INTRAMUSCULAR | Status: AC
  Filled 2011-03-20: qty 2

## 2011-03-20 MED ORDER — BUTAMBEN-TETRACAINE-BENZOCAINE 2-2-14 % EX AERO
INHALATION_SPRAY | CUTANEOUS | Status: DC | PRN
  Administered 2011-03-20: 2 via TOPICAL

## 2011-03-20 MED ORDER — MIDAZOLAM HCL 5 MG/5ML IJ SOLN
INTRAMUSCULAR | Status: DC | PRN
  Administered 2011-03-20: 1 mg via INTRAVENOUS
  Administered 2011-03-20 (×2): 2 mg via INTRAVENOUS

## 2011-03-20 NOTE — Interval H&P Note (Signed)
History and Physical Interval Note:  03/20/2011 1:11 PM  Kelly Hebert  has presented today for surgery, with the diagnosis of dysphagia  The various methods of treatment have been discussed with the patient and family. After consideration of risks, benefits and other options for treatment, the patient has consented to  Procedure(s) (LRB): ESOPHAGOGASTRODUODENOSCOPY (EGD) WITH ESOPHAGEAL DILATION (N/A) as a surgical intervention .  The patients' history has been reviewed, patient examined, no change in status, stable for surgery.  I have reviewed the patients' chart and labs.  Questions were answered to the patient's satisfaction.     [employer]

## 2011-03-20 NOTE — Progress Notes (Signed)
1425 to xray via wheelchair.

## 2011-03-20 NOTE — Discharge Instructions (Signed)
I dilated your esophagus. You have a stricture near the base of your esophagus. You have mild gastritis from Melxoicam and aspirin. I HAD TO STRETCH THE OPENING TO YOUR STOMACH AGAIN. IT IS NARROWED DUE TO CHRONIC USE OF ASPIRIN AND MELOXICAM.  FOR THE NEXT 12 HOURS TO 24 HOURS FOLLOW A SOFT MECHANICAL DIET. LET YOUR STOMACH BE YOUR GUIDE. IF YOU ARE FEELING PAINA ND PRESSURE-EAT LIGHT(SOUPS/MILK SHAKE).  FOLLOW A LOW FAT DIET. SEE INFO BELOW. CONTINUE NEXIUM. Take 30 minutes prior to your first meal. STOP TAKING MELOXICAM. FOLLOW UP IN July 2013.Marland Kitchen UPPER ENDOSCOPY AFTER CARE Read the instructions outlined below and refer to this sheet in the next week. These discharge instructions provide you with general information on caring for yourself after you leave the hospital. While your treatment has been planned according to the most current medical practices available, unavoidable complications occasionally occur. If you have any problems or questions after discharge, call DR. Twilla Khouri, [phone].  ACTIVITY  You may resume your regular activity, but move at a slower pace for the next 24 hours.   Take frequent rest periods for the next 24 hours.   Walking will help get rid of the air and reduce the bloated feeling in your belly (abdomen).   No driving for 24 hours (because of the medicine (anesthesia) used during the test).   You may shower.   Do not sign any important legal documents or operate any machinery for 24 hours (because of the anesthesia used during the test).    NUTRITION  Drink plenty of fluids.   You may resume your normal diet as instructed by your doctor.   Begin with a light meal and progress to your normal diet. Heavy or fried foods are harder to digest and may make you feel sick to your stomach (nauseated).   Avoid alcoholic beverages for 24 hours or as instructed.    MEDICATIONS  You may resume your normal medications.   WHAT YOU CAN EXPECT TODAY  Some  feelings of bloating in the abdomen.   Passage of more gas than usual.    IF YOU HAD A BIOPSY TAKEN DURING THE UPPER ENDOSCOPY:  No aspirin products for 3 days.   Eat a soft diet IF YOU HAVE NAUSEA, BLOATING, ABDOMINAL PAIN, OR VOMITING.    FINDING OUT THE RESULTS OF YOUR TEST Not all test results are available during your visit. DR. Darrick Penna WILL CALL YOU WITHIN 7 DAYS OF YOUR PROCEDUE WITH YOUR RESULTS. Do not assume everything is normal if you have not heard from DR. Shaine Mount IN ONE WEEK, CALL HER OFFICE AT [phone].  SEEK IMMEDIATE MEDICAL ATTENTION AND CALL THE OFFICE: [phone] IF:  You have more than a spotting of blood in your stool.   Your belly is swollen (abdominal distention).   You are nauseated or vomiting.   You have a temperature over 101F.   You have abdominal pain or discomfort that is severe or gets worse throughout the day.  Low-Fat Diet BREADS, CEREALS, PASTA, RICE, DRIED PEAS, AND BEANS These products are high in carbohydrates and most are low in fat. Therefore, they can be increased in the diet as substitutes for fatty foods. They too, however, contain calories and should not be eaten in excess. Cereals can be eaten for snacks as well as for breakfast.  Include foods that contain fiber (fruits, vegetables, whole grains, and legumes). Research shows that fiber may lower blood cholesterol levels, especially the water-soluble fiber found in fruits, vegetables,  oat products, and legumes. FRUITS AND VEGETABLES It is good to eat fruits and vegetables. Besides being sources of fiber, both are rich in vitamins and some minerals. They help you get the daily allowances of these nutrients. Fruits and vegetables can be used for snacks and desserts. MEATS Limit lean meat, chicken, [place], and fish to no more than 6 ounces per day. Beef, Pork, and Lamb Use lean cuts of beef, pork, and lamb. Lean cuts include:  Extra-lean ground beef.  Arm roast.  Sirloin tip.    Center-cut ham.  Round steak.  Loin chops.  Rump roast.  Tenderloin.  Trim all fat off the outside of meats before cooking. It is not necessary to severely decrease the intake of red meat, but lean choices should be made. Lean meat is rich in protein and contains a highly absorbable form of iron. Premenopausal women, in particular, should avoid reducing lean red meat because this could increase the risk for low red blood cells (iron-deficiency anemia).  Chicken and [place] These are good sources of protein. The fat of poultry can be reduced by removing the skin and underlying fat layers before cooking. Chicken and [place] can be substituted for lean red meat in the diet. Poultry should not be fried or covered with high-fat sauces. Fish and Shellfish Fish is a good source of protein. Shellfish contain cholesterol, but they usually are low in saturated fatty acids. The preparation of fish is important. Like chicken and [place], they should not be fried or covered with high-fat sauces. EGGS Egg whites contain no fat or cholesterol. They can be eaten often. Try 1 to 2 egg whites instead of whole eggs in recipes or use egg substitutes that do not contain yolk.  MILK AND DAIRY PRODUCTS Use skim or 1% milk instead of 2% or whole milk. Decrease whole milk, natural, and processed cheeses. Use nonfat or low-fat (2%) cottage cheese or low-fat cheeses made from vegetable oils. Choose nonfat or low-fat (1 to 2%) yogurt. Experiment with evaporated skim milk in recipes that call for heavy cream. Substitute low-fat yogurt or low-fat cottage cheese for sour cream in dips and salad dressings. Have at least 2 servings of low-fat dairy products, such as 2 glasses of skim (or 1%) milk each day to help get your daily calcium intake.  FATS AND OILS Butterfat, lard, and beef fats are high in saturated fat and cholesterol. These should be avoided.Vegetable fats do not contain cholesterol. AVOID coconut oil, palm oil, and  palm kernel oil, WHICH are very high in saturated fats. These should be limited. These fats are often used in bakery goods, processed foods, popcorn, oils, and nondairy creamers. Vegetable shortenings and some peanut butters contain hydrogenated oils, which are also saturated fats. Read the labels on these foods and check for saturated vegetable oils.  Desirable liquid vegetable oils are corn oil, cottonseed oil, olive oil, canola oil, safflower oil, soybean oil, and sunflower oil. Peanut oil is not as good, but small amounts are acceptable. Buy a heart-healthy tub margarine that has no partially hydrogenated oils in the ingredients. AVOID Mayonnaise and salad dressings often are made from unsaturated fats.  OTHER EATING TIPS Snacks  Most sweets should be limited as snacks. They tend to be rich in calories and fats, and their caloric content outweighs their nutritional value. Some good choices in snacks are graham crackers, melba toast, soda crackers, bagels (no egg), English muffins, fruits, and vegetables. These snacks are preferable to snack crackers, [place] fries, and  chips. Popcorn should be air-popped or cooked in small amounts of liquid vegetable oil.  Desserts Eat fruit, low-fat yogurt, and fruit ices instead of pastries, cake, and cookies. Sherbet, angel food cake, gelatin dessert, frozen low-fat yogurt, or other frozen products that do not contain saturated fat (pure fruit juice bars, frozen ice pops) are also acceptable.   COOKING METHODS Choose those methods that use little or no fat. They include: Poaching.  Braising.  Steaming.  Grilling.  Baking.  Stir-frying.  Broiling.  Microwaving.  Foods can be cooked in a nonstick pan without added fat, or use a nonfat cooking spray in regular cookware. Limit fried foods and avoid frying in saturated fat. Add moisture to lean meats by using water, broth, cooking wines, and other nonfat or low-fat sauces along with the cooking methods  mentioned above. Soups and stews should be chilled after cooking. The fat that forms on top after a few hours in the refrigerator should be skimmed off. When preparing meals, avoid using excess salt. Salt can contribute to raising blood pressure in some people.  EATING AWAY FROM HOME Order entres, potatoes, and vegetables without sauces or butter. When meat exceeds the size of a deck of cards (3 to 4 ounces), the rest can be taken home for another meal. Choose vegetable or fruit salads and ask for low-calorie salad dressings to be served on the side. Use dressings sparingly. Limit high-fat toppings, such as bacon, crumbled eggs, cheese, sunflower seeds, and olives. Ask for heart-healthy tub margarine instead of butter.  Gastritis  Gastritis is an inflammation (the body's way of reacting to injury and/or infection) of the stomach. It is often caused by viral or bacterial (germ) infections. It can also be caused BY ASPIRIN, BC/GOODY POWDER'S, (IBUPROFEN) MOTRIN, OR ALEVE (NAPROXEN), chemicals (including alcohol), SPICY FOODS, and medications. This illness may be associated with generalized malaise (feeling tired, not well), UPPER ABDOMINAL STOMACH cramps, and fever. One common bacterial cause of gastritis is an organism known as H. Pylori. This can be treated with antibiotics.   SOPHAGEAL STRICTURE  Esophageal strictures can be caused by stomach acid backing up into the tube that carries food from the mouth down to the stomach (lower esophagus).  TREATMENT There are a number of non-prescription medicines used to treat reflux/stricture, including: Antacids.  Proton-pump inhibitors: Nexium   HOME CARE INSTRUCTIONS Eat 2-3 hours before going to bed.  Try to reach and maintain a healthy weight.  Do not eat just a few very large meals. Instead, eat 4 TO 6 smaller meals throughout the day.  Try to identify foods and beverages that make your symptoms worse, and avoid these.  Avoid tight clothing.    Do not exercise right after eating.

## 2011-03-20 NOTE — Telephone Encounter (Signed)
Results Cc to PCP an reminder is in the computer

## 2011-03-20 NOTE — Progress Notes (Signed)
1410 PT c/o midsternal chest pain. Xray ordered by DR FIelds

## 2011-03-20 NOTE — Op Note (Signed)
[facility] [address] [place], [place]  [address]  ENDOSCOPY PROCEDURE REPORT  PATIENT:  Kelly Hebert, Kelly Hebert  MR#:  [id number] BIRTHDATE:  1940-12-20, 70 yrs. old  GENDER:  female  ENDOSCOPIST:  Jonette Eva, MD ASSISTANT: Referred by:  Ninfa Linden, NP  PROCEDURE DATE:  03/20/2011 PROCEDURE:  EGD with balloon dilatation, EGD with dilatation over guidewire ASA CLASS: INDICATIONS:  DYSPHAGIA  MEDICATIONS:   Demerol 50 mg IV, Versed 5 mg IV TOPICAL ANESTHETIC:  Cetacaine Spray  DESCRIPTION OF PROCEDURE:   After the risks benefits and alternatives of the procedure were thoroughly explained, informed consent was obtained.  The EG-[id number] ([id number]) and EG-2990i ([id number]) endoscope was introduced through the mouth and advanced to the second portion of the duodenum.  The instrument was slowly withdrawn as the mucosa was carefully examined.  Prior to withdrawal of the scope, the guidwire was placed.  The esophagus was dilated successfully.  The patient was recovered in endoscopy and discharged home in satisfactory condition. <<PROCEDUREIMAGES>>  A stricture was found in the distal esophagus.  stenosis at the pylorus DILATED TO 13.5 MM WITH TTS BALLOON.    Dilation was then performed at the distal esophagus. NO BARRETT'S. NL DUODENUM.  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 AVOID ASA/NSAIDS UNLESS MEDICALLY NECESSARY NEXIUM DAILY SOFT MECH DIET FOR 12-24 HOURS THEN LOW FAT DIET. OPV JUL 2013  REPEAT EXAM:  No  ______________________________ Jonette Eva, MD  CC:  n. eSIGNED:   Rosalita Carey at 03/20/2011  04:05 PM  Marlane Hatcher, [phone]

## 2011-03-20 NOTE — H&P (View-Only) (Signed)
Referring Provider: Patterson, Kathy, NP Primary Care Physician:  PATTERSON,KATHY, NP, NP Primary Gastroenterologist: Dr. Fields   Chief Complaint  Patient presents with  . Follow-up    HPI:   Ms. Kelly Hebert returns today in hospital follow-up after admission to [facility] in December 2012. Admitted with diarrhea, hematochezia, and CT findings of diffuse wall thickening and pericolonic inflammation involving descending colon, consistent with inflammatory or infectious colitis. Treated with Cipro and Flagyl. At time of hospitalization, also reported dysphagia with pills and solid foods. Stool studies negative.   Returns today without further diarrhea, hematochezia. Noted slight LLQ discomfort a few weeks after leaving hospital but doing well since. No nausea or vomiting. Intermittent dysphagia with solids and some pills. Hx of dilation by Dr. Patel, remote past. ?10 years ago. Reports reflux flares. Only taking Prilosec once per day.   Has taken Protonix and Prevacid in the past.   Past Medical History  Diagnosis Date  . Renal disorder     pelvic kidney  . Hypercholesteremia   . Hypertension   . Acid reflux   . Anxiety   . Depression   . COPD (chronic obstructive pulmonary disease)   . S/P colonoscopy 2003    Dr. Kapur: normal colon, normal path.     Past Surgical History  Procedure Date  . Abdominal hysterectomy     complete  . Cholecystectomy   . Rotator cuff repair   . Laparoscopy 2000    [place]-stomach pain-pt reports negative    Current Outpatient Prescriptions  Medication Sig Dispense Refill  . albuterol (PROVENTIL HFA;VENTOLIN HFA) 108 (90 BASE) MCG/ACT inhaler Inhale 2 puffs into the lungs every 6 (six) hours as needed. Shortness of breath       . citalopram (CELEXA) 40 MG tablet Take 40 mg by mouth daily.        . fluticasone (FLONASE) 50 MCG/ACT nasal spray Place 2 sprays into the nose daily.        . fluticasone (FLOVENT HFA) 110 MCG/ACT inhaler Inhale 1 puff into the  lungs 2 (two) times daily.        . levothyroxine (SYNTHROID, LEVOTHROID) 50 MCG tablet Take 50 mcg by mouth daily.        . LORazepam (ATIVAN) 1 MG tablet Take 1 mg by mouth 2 (two) times daily.       . magnesium gluconate (MAGONATE) 500 MG tablet Take 500 mg by mouth daily.        . omeprazole (PRILOSEC) 20 MG capsule Take 20 mg by mouth daily.        . simvastatin (ZOCOR) 40 MG tablet Take 40 mg by mouth daily.        . Vitamin D, Ergocalciferol, (DRISDOL) 50000 UNITS CAPS Take 50,000 Units by mouth every 30 (thirty) days. Patient takes twice a month         Allergies as of 02/19/2011 - Review Complete 02/19/2011  Allergen Reaction Noted  . Ivp dye (iodinated diagnostic agents) Hives and Itching 01/06/2011    Family History  Problem Relation Age of Onset  . Kidney disease Sister   . Cirrhosis Sister     Non-etoh  . Cirrhosis Brother     ?etoh  . Colon cancer Neg Hx     History   Social History  . Marital Status: Married    Spouse Name: N/A    Number of Children: 3  . Years of Education: N/A   Occupational History  . retired [employer]    Social   History Main Topics  . Smoking status: Never Smoker   . Smokeless tobacco: None  . Alcohol Use: No  . Drug Use: No  . Sexually Active: None   Other Topics Concern  . None   Social History Narrative  . None    Review of Systems: Gen: Denies fever, chills, anorexia. Denies fatigue, weakness, weight loss.  CV: Denies chest pain, palpitations, syncope, peripheral edema, and claudication. Resp: Denies dyspnea at rest, cough, wheezing, coughing up blood, and pleurisy. GI: SEE HPI Derm: Denies rash, itching, dry skin Psych: Denies depression, anxiety, memory loss, confusion. No homicidal or suicidal ideation.  Heme: Denies bruising, bleeding, and enlarged lymph nodes.  Physical Exam: BP 137/75  Pulse 90  Temp(Src) 99.2 F (37.3 C) (Temporal)  Ht 5' 2" (1.575 m)  Wt 135 lb (61.236 kg)  BMI 24.69 kg/m2 General:   Alert  and oriented. No distress noted. Pleasant and cooperative.  Head:  Normocephalic and atraumatic. Eyes:  Conjuctiva clear without scleral icterus. Mouth:  Oral mucosa pink and moist. Good dentition. No lesions. Neck:  Supple, without mass or thyromegaly. Heart:  S1, S2 present without murmurs, rubs, or gallops. Regular rate and rhythm. Abdomen:  +BS, soft, non-tender and non-distended. No rebound or guarding. No HSM or masses noted. Msk:  Symmetrical without gross deformities. Normal posture. Pulses:  2+ DP noted bilaterally Extremities:  Without edema. Neurologic:  Alert and  oriented x4;  grossly normal neurologically. Skin:  Intact without significant lesions or rashes. Cervical Nodes:  No significant cervical adenopathy. Psych:  Alert and cooperative. Normal mood and affect.  

## 2011-03-20 NOTE — Progress Notes (Signed)
1513 returned from xray via wheelchair.family at bedside.

## 2011-03-21 NOTE — Telephone Encounter (Signed)
Informed pt .

## 2011-03-27 ENCOUNTER — Telehealth: Payer: Self-pay | Admitting: Gastroenterology

## 2011-03-27 NOTE — Telephone Encounter (Signed)
Patient was put on samples of Nexium and she is doing well and wants Rx called to [facility] in [place]

## 2011-03-27 NOTE — Telephone Encounter (Signed)
CALL IN NEXIUM 40 MG 30 MINUTES PRIOR TO FIRST MEAL , #[id number], [id number].

## 2011-03-27 NOTE — Telephone Encounter (Signed)
Rx called to [place] [facility]

## 2011-03-29 ENCOUNTER — Telehealth: Payer: Self-pay | Admitting: Gastroenterology

## 2011-03-29 NOTE — Telephone Encounter (Signed)
Patient had procedure Feb 26th and shes asking for results please advise??

## 2011-03-29 NOTE — Telephone Encounter (Signed)
PLEASE CALL PT. We called her with her results from Feb 22 on Feb 27. SHE HAS GASTRITIS DUE TO ASA AND MELOXICAM USE. SHE DID NOT HAVE ANY BIOPSIES TAKEN ON FEB 26. ON FEB 26 SHE HAD HER ESOPHAGUS STRETCHED, AS WELL THE OPENING TO HER STOMACH WAS STRETCHED. HER STOMACH OPENING IS NARROWED DUE TO CHRONIC USE OF ASPIRIN AND MELOXICAM.  SHE SHOULD FOLLOW A LOW FAT DIET.  CONTINUE NEXIUM. Take 30 minutes prior to your first meal. STOP TAKING MELOXICAM. FOLLOW UP IN July 2013.

## 2011-03-29 NOTE — Telephone Encounter (Signed)
Pt informed

## 2011-06-03 ENCOUNTER — Emergency Department (HOSPITAL_COMMUNITY): Payer: Medicare Other

## 2011-06-03 ENCOUNTER — Encounter (HOSPITAL_COMMUNITY): Payer: Self-pay

## 2011-06-03 ENCOUNTER — Emergency Department (HOSPITAL_COMMUNITY)
Admission: EM | Admit: 2011-06-03 | Discharge: 2011-06-03 | Disposition: A | Discharge status: Home / Self Care | Payer: Medicare Other | Attending: Emergency Medicine | Admitting: Emergency Medicine

## 2011-06-03 DIAGNOSIS — Z79899 Other long term (current) drug therapy: Secondary | ICD-10-CM | POA: Insufficient documentation

## 2011-06-03 DIAGNOSIS — I10 Essential (primary) hypertension: Secondary | ICD-10-CM | POA: Insufficient documentation

## 2011-06-03 DIAGNOSIS — R05 Cough: Secondary | ICD-10-CM | POA: Insufficient documentation

## 2011-06-03 DIAGNOSIS — R059 Cough, unspecified: Secondary | ICD-10-CM | POA: Insufficient documentation

## 2011-06-03 DIAGNOSIS — J209 Acute bronchitis, unspecified: Secondary | ICD-10-CM

## 2011-06-03 MED ORDER — DOXYCYCLINE HYCLATE 100 MG PO CAPS: 100.0000 mg | ORAL_CAPSULE | Freq: Two times a day (BID) | ORAL | Status: AC

## 2011-06-03 MED ORDER — AZITHROMYCIN 250 MG PO TABS
ORAL_TABLET | ORAL | Status: DC
Start: 2011-06-03 — End: 2011-06-03

## 2011-06-03 NOTE — Discharge Instructions (Signed)

## 2011-06-03 NOTE — ED Notes (Signed)
Pt states she has had a cold since Friday. States she was coughing this morning and started having pain under left shoulder

## 2011-06-03 NOTE — ED Provider Notes (Signed)
History   This chart was scribed for Osvaldo Human, MD by Toya Smothers. The patient was seen in room APA12/APA12. Patient's care was started at 1618.  CSN: [id number]  Arrival date & time 06/03/11  1618  First MD Initiated Contact with Patient 06/03/11 1640     Chief Complaint  Patient presents with  . Cough   The history is provided by the patient. No language interpreter was used.    Kelly Hebert is a 71 y.o. female who presents to the Emergency Department complaining of gradual onset constant moderate cough described similarly to previous episode of pneumonia, onset 3 days ago, worsening 4 hours ago with associated symptoms of shoulder pain, and sweating. The patient states that taking Coricidin eased cough, and denies phylum with cough, fever, abdominal pain, and leg swelling. The patient has a h/o renal disorder, Hypercholesteremia, hypertension, COPD, and hysterectomy in 2009, but denies smoking and the use of alcohol.  Pt list PCP as Dr. Jay Schlichter in [place], [place]   Past Medical History  Diagnosis Date  . Renal disorder     pelvic kidney  . Hypercholesteremia   . Hypertension   . Acid reflux   . Anxiety   . Depression   . COPD (chronic obstructive pulmonary disease)   . S/P colonoscopy 2003    Dr. Maryruth Bun: normal colon, normal path.    Past Surgical History  Procedure Date  . Abdominal hysterectomy     complete  . Cholecystectomy   . Rotator cuff repair   . Laparoscopy 2000    [place]-stomach pain-pt reports negative  . Colonoscopy 03/09/2011    Procedure: COLONOSCOPY;  Surgeon: Arlyce Harman, MD;  Location: [facility];  Service: Endoscopy;  Laterality: N/A;  10:45   Family History  Problem Relation Age of Onset  . Kidney disease Sister   . Cirrhosis Sister     Non-etoh  . Cirrhosis Brother     ?etoh  . Colon cancer Neg Hx    History  Substance Use Topics  . Smoking status: Never Smoker   . Smokeless tobacco: Not on file  . Alcohol Use:  No   Review of Systems  Constitutional: Negative for fever.       Sweating  HENT: Negative for rhinorrhea.   Eyes: Negative for pain.  Respiratory: Positive for cough. Negative for shortness of breath.   Cardiovascular: Negative for chest pain.  Gastrointestinal: Negative for nausea, vomiting, abdominal pain and diarrhea.  Genitourinary: Negative for dysuria.  Musculoskeletal: Negative for back pain.       Shoulder Pain  Skin: Negative for rash.  Neurological: Negative for weakness and headaches.    Allergies  Ivp dye  Home Medications   Current Outpatient Rx  Name Route Sig Dispense Refill  . ALBUTEROL SULFATE HFA 108 (90 BASE) MCG/ACT IN AERS Inhalation Inhale 2 puffs into the lungs every 6 (six) hours as needed. Shortness of breath     . AMLODIPINE BESYLATE 10 MG PO TABS Oral Take 10 mg by mouth Daily.    Marland Kitchen BENAZEPRIL-HYDROCHLOROTHIAZIDE 20-12.5 MG PO TABS Oral Take 2 tablets by mouth Daily.    Marland Kitchen CITALOPRAM HYDROBROMIDE 40 MG PO TABS Oral Take 40 mg by mouth daily.      Marland Kitchen ESOMEPRAZOLE MAGNESIUM 40 MG PO CPDR Oral Take 1 capsule (40 mg total) by mouth daily. Take 30 min prior to breakfast daily. 30 capsule 1  . FLUTICASONE PROPIONATE 50 MCG/ACT NA SUSP Nasal Place 2 sprays into  the nose daily.      Marland Kitchen FLUTICASONE PROPIONATE  HFA 110 MCG/ACT IN AERO Inhalation Inhale 1 puff into the lungs 2 (two) times daily. Patient only uses if needed    . LEVOTHYROXINE SODIUM 50 MCG PO TABS Oral Take 50 mcg by mouth daily.      Marland Kitchen LORAZEPAM 1 MG PO TABS Oral Take 1 mg by mouth 2 (two) times daily.     Marland Kitchen MAGNESIUM GLUCONATE 500 MG PO TABS Oral Take 500 mg by mouth at bedtime as needed. Patient states she doesn,t take every night    . SIMVASTATIN 40 MG PO TABS Oral Take 40 mg by mouth daily.      Marland Kitchen VITAMIN D (ERGOCALCIFEROL) 50000 UNITS PO CAPS Oral Take 50,000 Units by mouth every 30 (thirty) days. Patient takes twice a month       BP 118/75  Pulse 74  Temp(Src) 98.2 F (36.8 C) (Oral)   Resp 20  Ht 5\' 2"  (1.575 m)  Wt 140 lb (63.504 kg)  BMI 25.61 kg/m2  SpO2 100%  Physical Exam  Nursing note and vitals reviewed. Constitutional: She is oriented to person, place, and time. She appears well-developed and well-nourished. No distress.  HENT:  Head: Normocephalic and atraumatic.  Eyes: EOM are normal. Pupils are equal, round, and reactive to light. No scleral icterus.  Neck: Neck supple. No tracheal deviation present.  Cardiovascular: Normal rate, regular rhythm and normal heart sounds.  Exam reveals no gallop and no friction rub.   No murmur heard. Pulmonary/Chest: Effort normal. No respiratory distress. She has no wheezes. She has no rales. She exhibits no tenderness and no deformity.  Abdominal: Soft. Bowel sounds are normal. She exhibits no distension. There is no tenderness.  Musculoskeletal: Normal range of motion. She exhibits no edema and no tenderness.  Neurological: She is alert and oriented to person, place, and time. She has normal reflexes. No cranial nerve deficit or sensory deficit. Coordination normal.  Skin: Skin is warm and dry. No rash noted. No erythema.  Psychiatric: She has a normal mood and affect. Her behavior is normal.    ED Course  Procedures (including critical care time)  DIAGNOSTIC STUDIES: Oxygen Saturation is 100% on room air, normal by my interpretation.    COORDINATION OF CARE: 4:50pm - Reviewed finding of physical exam and ordered a chest x-ray   Dg Chest 2 View  06/03/2011  *RADIOLOGY REPORT*  Clinical Data: Cough  CHEST - 2 VIEW  Comparison: 04/22/2004  Findings: Cardiomediastinal silhouette is stable.  No acute infiltrate or pleural effusion.  No pulmonary edema.  Minimal left basilar atelectasis or scarring.  IMPRESSION: No active disease.  Minimal left basilar atelectasis or scarring.  Original Report Authenticated By: Natasha Mead, M.D.    1. Acute bronchitis     DISP:  Rx with doxycycline 100 mg bid x 7 days to treat acute  bronchitis in pt with Hx of COPD.     I personally performed the services described in this documentation, which was scribed in my presence. The recorded information has been reviewed and considered.  Osvaldo Human, M.D.   Carleene Cooper III, MD 06/03/11 [phone]

## 2011-06-12 ENCOUNTER — Encounter: Payer: Self-pay | Admitting: Gastroenterology

## 2011-08-01 ENCOUNTER — Encounter: Payer: Self-pay | Admitting: Gastroenterology

## 2011-08-01 ENCOUNTER — Ambulatory Visit (INDEPENDENT_AMBULATORY_CARE_PROVIDER_SITE_OTHER): Payer: Medicare Other | Admitting: Gastroenterology

## 2011-08-01 VITALS — BP 131/80 | HR 89 | Temp 97.8°F | Ht 62.0 in | Wt 142.4 lb

## 2011-08-01 DIAGNOSIS — K219 Gastro-esophageal reflux disease without esophagitis: Secondary | ICD-10-CM

## 2011-08-01 DIAGNOSIS — R1310 Dysphagia, unspecified: Secondary | ICD-10-CM

## 2011-08-01 NOTE — Progress Notes (Signed)
Forwarded to Dawn  

## 2011-08-01 NOTE — Assessment & Plan Note (Signed)
SX RESOLVED AFTER EGD/DIL x2 AND PYLORIC DIL.  OPV IN 4 MOS. REASSESS SX.

## 2011-08-01 NOTE — Patient Instructions (Signed)
TRY TAKING NEXIUM 30 MINUTES PRIOR TO YOUR FIRST AND LAST MEAL OF THE DAY FOR THE NEXT 10 DAYS. IF IT MAKES THE BITTER TASTE GO AWAY CALL ME AND I WILL INCREASE YOUR PRESCRIPTION.  LOSE 2-3 LBS.  FOLLOW A LOW FAT DIET. SEE INFO BELOW.  FOLLOW UP IN 4 MOS.   Low-Fat Diet BREADS, CEREALS, PASTA, RICE, DRIED PEAS, AND BEANS These products are high in carbohydrates and most are low in fat. Therefore, they can be increased in the diet as substitutes for fatty foods. They too, however, contain calories and should not be eaten in excess. Cereals can be eaten for snacks as well as for breakfast.  Include foods that contain fiber (fruits, vegetables, whole grains, and legumes). Research shows that fiber may lower blood cholesterol levels, especially the water-soluble fiber found in fruits, vegetables, oat products, and legumes. FRUITS AND VEGETABLES It is good to eat fruits and vegetables. Besides being sources of fiber, both are rich in vitamins and some minerals. They help you get the daily allowances of these nutrients. Fruits and vegetables can be used for snacks and desserts. MEATS Limit lean meat, chicken, [place], and fish to no more than 6 ounces per day. Beef, Pork, and Lamb Use lean cuts of beef, pork, and lamb. Lean cuts include:  Extra-lean ground beef.  Arm roast.  Sirloin tip.  Center-cut ham.  Round steak.  Loin chops.  Rump roast.  Tenderloin.  Trim all fat off the outside of meats before cooking. It is not necessary to severely decrease the intake of red meat, but lean choices should be made. Lean meat is rich in protein and contains a highly absorbable form of iron. Premenopausal women, in particular, should avoid reducing lean red meat because this could increase the risk for low red blood cells (iron-deficiency anemia).  Chicken and [place] These are good sources of protein. The fat of poultry can be reduced by removing the skin and underlying fat layers before cooking. Chicken  and [place] can be substituted for lean red meat in the diet. Poultry should not be fried or covered with high-fat sauces. Fish and Shellfish Fish is a good source of protein. Shellfish contain cholesterol, but they usually are low in saturated fatty acids. The preparation of fish is important. Like chicken and [place], they should not be fried or covered with high-fat sauces. EGGS Egg whites contain no fat or cholesterol. They can be eaten often. Try 1 to 2 egg whites instead of whole eggs in recipes or use egg substitutes that do not contain yolk.  MILK AND DAIRY PRODUCTS Use skim or 1% milk instead of 2% or whole milk. Decrease whole milk, natural, and processed cheeses. Use nonfat or low-fat (2%) cottage cheese or low-fat cheeses made from vegetable oils. Choose nonfat or low-fat (1 to 2%) yogurt. Experiment with evaporated skim milk in recipes that call for heavy cream. Substitute low-fat yogurt or low-fat cottage cheese for sour cream in dips and salad dressings. Have at least 2 servings of low-fat dairy products, such as 2 glasses of skim (or 1%) milk each day to help get your daily calcium intake.  FATS AND OILS Butterfat, lard, and beef fats are high in saturated fat and cholesterol. These should be avoided.Vegetable fats do not contain cholesterol. AVOID coconut oil, palm oil, and palm kernel oil, WHICH are very high in saturated fats. These should be limited. These fats are often used in bakery goods, processed foods, popcorn, oils, and nondairy creamers. Vegetable  shortenings and some peanut butters contain hydrogenated oils, which are also saturated fats. Read the labels on these foods and check for saturated vegetable oils.  Desirable liquid vegetable oils are corn oil, cottonseed oil, olive oil, canola oil, safflower oil, soybean oil, and sunflower oil. Peanut oil is not as good, but small amounts are acceptable. Buy a heart-healthy tub margarine that has no partially hydrogenated oils in  the ingredients. AVOID Mayonnaise and salad dressings often are made from unsaturated fats.  OTHER EATING TIPS Snacks  Most sweets should be limited as snacks. They tend to be rich in calories and fats, and their caloric content outweighs their nutritional value. Some good choices in snacks are graham crackers, melba toast, soda crackers, bagels (no egg), English muffins, fruits, and vegetables. These snacks are preferable to snack crackers, [place] fries, and chips. Popcorn should be air-popped or cooked in small amounts of liquid vegetable oil.  Desserts Eat fruit, low-fat yogurt, and fruit ices instead of pastries, cake, and cookies. Sherbet, angel food cake, gelatin dessert, frozen low-fat yogurt, or other frozen products that do not contain saturated fat (pure fruit juice bars, frozen ice pops) are also acceptable.   COOKING METHODS Choose those methods that use little or no fat. They include: Poaching.  Braising.  Steaming.  Grilling.  Baking.  Stir-frying.  Broiling.  Microwaving.  Foods can be cooked in a nonstick pan without added fat, or use a nonfat cooking spray in regular cookware. Limit fried foods and avoid frying in saturated fat. Add moisture to lean meats by using water, broth, cooking wines, and other nonfat or low-fat sauces along with the cooking methods mentioned above. Soups and stews should be chilled after cooking. The fat that forms on top after a few hours in the refrigerator should be skimmed off. When preparing meals, avoid using excess salt. Salt can contribute to raising blood pressure in some people.  EATING AWAY FROM HOME Order entres, potatoes, and vegetables without sauces or butter. When meat exceeds the size of a deck of cards (3 to 4 ounces), the rest can be taken home for another meal. Choose vegetable or fruit salads and ask for low-calorie salad dressings to be served on the side. Use dressings sparingly. Limit high-fat toppings, such as bacon,  crumbled eggs, cheese, sunflower seeds, and olives. Ask for heart-healthy tub margarine instead of butter.

## 2011-08-01 NOTE — Assessment & Plan Note (Signed)
?   NOT IDEALLY CONTROLLED. BITTER TASTE AMY BE DUE TO PND OR DENTURES THAT DON'T FIT PROPERLY.  INCREASE NEXIUM TO BID TO SEE IF SX IMPROVE. SAMPLES #10 GIVEN. FOLLOW A LOW FAT DIET. LOSE 2-3 LBS. OPV IN 4 MOS.

## 2011-08-01 NOTE — Progress Notes (Signed)
  Subjective:    Patient ID: Kelly Hebert, female    DOB: January 16, 1941, 71 y.o.   MRN: [id number]  PCP: [facility]  HPI ONLY TASTE BITTER TASTE WHEN SHE'S EATING. HAS HAD DRAINAGE FROM BACK OF THROAT-for a while. If has sinus problems has pain over her eyes. No problems swallowing, or vomiting. NAUSEA: RARE. NEVER MISSES DOSES OF NEXIUM. STARTS EATING FOOD doesn't tase right it's better. Burning sensation when she burps-rare. No sneezing. No dry cough and using INH more.  GOING TO [facility] FOR LEFT PELVIC PAIN RADIATING TO HER LEFT LEG.  Past Medical History  Diagnosis Date  . Renal disorder     pelvic kidney  . Hypercholesteremia   . Hypertension   . Acid reflux   . Anxiety   . Depression   . COPD (chronic obstructive pulmonary disease)   . S/P colonoscopy 2003    Dr. Maryruth Bun: normal colon, normal path.     Past Surgical History  Procedure Date  . Abdominal hysterectomy     complete  . Cholecystectomy   . Rotator cuff repair   . Laparoscopy 2000    [place]-stomach pain-pt reports negative  . Colonoscopy 03/09/2011    Procedure: COLONOSCOPY;  Surgeon: Arlyce Harman, MD;  Location: [facility];  Service: Endoscopy;  Laterality: N/A;  10:45       Review of Systems     Objective:   Physical Exam  Constitutional: She is oriented to person, place, and time. She appears well-developed. No distress.  HENT:  Head: Normocephalic and atraumatic.  Mouth/Throat: Oropharynx is clear and moist. No oropharyngeal exudate.  Eyes: Pupils are equal, round, and reactive to light. No scleral icterus.  Neck: Normal range of motion. Neck supple.  Cardiovascular: Normal rate, regular rhythm and normal heart sounds.   Pulmonary/Chest: Effort normal and breath sounds normal. No respiratory distress.  Abdominal: Soft. Bowel sounds are normal. She exhibits no distension. There is no tenderness.  Musculoskeletal: She exhibits no edema.  Lymphadenopathy:    She has no cervical adenopathy.    Neurological: She is alert and oriented to person, place, and time.       NO FOCAL DEFICITS   Psychiatric: She has a normal mood and affect.          Assessment & Plan:

## 2011-08-02 NOTE — Progress Notes (Signed)
Reminder in epic to follow up in 4 months with SF 

## 2011-08-06 NOTE — Progress Notes (Signed)
Faxed to PCP

## 2011-10-08 ENCOUNTER — Emergency Department (HOSPITAL_COMMUNITY)
Admission: EM | Admit: 2011-10-08 | Discharge: 2011-10-08 | Disposition: A | Discharge status: Home / Self Care | Payer: Medicare Other | Attending: Emergency Medicine | Admitting: Emergency Medicine

## 2011-10-08 ENCOUNTER — Encounter (HOSPITAL_COMMUNITY): Payer: Self-pay | Admitting: *Deleted

## 2011-10-08 DIAGNOSIS — M79671 Pain in right foot: Secondary | ICD-10-CM

## 2011-10-08 DIAGNOSIS — F411 Generalized anxiety disorder: Secondary | ICD-10-CM | POA: Insufficient documentation

## 2011-10-08 DIAGNOSIS — J449 Chronic obstructive pulmonary disease, unspecified: Secondary | ICD-10-CM | POA: Insufficient documentation

## 2011-10-08 DIAGNOSIS — N289 Disorder of kidney and ureter, unspecified: Secondary | ICD-10-CM | POA: Insufficient documentation

## 2011-10-08 DIAGNOSIS — K219 Gastro-esophageal reflux disease without esophagitis: Secondary | ICD-10-CM | POA: Insufficient documentation

## 2011-10-08 DIAGNOSIS — M79609 Pain in unspecified limb: Secondary | ICD-10-CM | POA: Insufficient documentation

## 2011-10-08 DIAGNOSIS — J4489 Other specified chronic obstructive pulmonary disease: Secondary | ICD-10-CM | POA: Insufficient documentation

## 2011-10-08 DIAGNOSIS — M109 Gout, unspecified: Secondary | ICD-10-CM | POA: Insufficient documentation

## 2011-10-08 DIAGNOSIS — I10 Essential (primary) hypertension: Secondary | ICD-10-CM | POA: Insufficient documentation

## 2011-10-08 DIAGNOSIS — E7800 Pure hypercholesterolemia, unspecified: Secondary | ICD-10-CM | POA: Insufficient documentation

## 2011-10-08 HISTORY — DX: Gout, unspecified: M10.9

## 2011-10-08 MED ORDER — HYDROCODONE-ACETAMINOPHEN 5-325 MG PO TABS
1.0000 | ORAL_TABLET | Freq: Once | ORAL | Status: AC
  Administered 2011-10-08: 1 via ORAL
  Filled 2011-10-08: qty 1

## 2011-10-08 MED ORDER — PREDNISONE 50 MG PO TABS: 50.0000 mg | ORAL_TABLET | Freq: Every day | ORAL | Status: DC

## 2011-10-08 MED ORDER — PREDNISONE 20 MG PO TABS
60.0000 mg | ORAL_TABLET | Freq: Once | ORAL | Status: AC
  Administered 2011-10-08: 60 mg via ORAL
  Filled 2011-10-08: qty 3

## 2011-10-08 NOTE — ED Provider Notes (Signed)
Medical screening examination/treatment/procedure(s) were performed by non-physician practitioner and as supervising physician I was immediately available for consultation/collaboration.  Silus Lanzo R. Yaret Hush, MD 10/08/11 2313 

## 2011-10-08 NOTE — ED Provider Notes (Signed)
History     CSN: [id number]  Arrival date & time 10/08/11  1630   First MD Initiated Contact with Patient 10/08/11 1738      Chief Complaint  Patient presents with  . Ankle Pain    (Consider location/radiation/quality/duration/timing/severity/associated sxs/prior treatment) HPI Comments: Seen ~ 2 weeks ago by dr. Jarold Motto, and dx with gout and prescribed " a cream to put on it".  Not improving.    Pt wants a referral to a podiatrist.  She complains of pain generally in feet and swelling after being on her feet all days.  No specific joint pain.  No h/o of gout.  The history is provided by the patient. No language interpreter was used.    Past Medical History  Diagnosis Date  . Hypercholesteremia   . Hypertension   . Acid reflux   . Anxiety   . Depression   . COPD (chronic obstructive pulmonary disease)   . S/P colonoscopy 2003    Dr. Maryruth Bun: normal colon, normal path.   . Renal disorder     pelvic kidney  . Gout     Past Surgical History  Procedure Date  . Abdominal hysterectomy     complete  . Rotator cuff repair   . Laparoscopy 2000    [facility]-stomach pain-pt reports negative  . Colonoscopy 03/09/2011    Procedure: COLONOSCOPY;  Surgeon: Arlyce Harman, MD;  Location: [facility];  Service: Endoscopy;  Laterality: N/A;  10:45    Family History  Problem Relation Age of Onset  . Kidney disease Sister   . Cirrhosis Sister     Non-etoh  . Cirrhosis Brother     ?etoh  . Colon cancer Neg Hx     History  Substance Use Topics  . Smoking status: Never Smoker   . Smokeless tobacco: Not on file  . Alcohol Use: No    OB History    Grav Para Term Preterm Abortions TAB SAB Ect Mult Living                  Review of Systems  Constitutional: Negative for fever and chills.  Respiratory: Negative for chest tightness, shortness of breath, wheezing and stridor.   Cardiovascular: Negative for chest pain.  Musculoskeletal:       Foot and lower leg pain and  swelling   All other systems reviewed and are negative.    Allergies  Ivp dye  Home Medications   Current Outpatient Rx  Name Route Sig Dispense Refill  . ALBUTEROL SULFATE HFA 108 (90 BASE) MCG/ACT IN AERS Inhalation Inhale 2 puffs into the lungs every 6 (six) hours as needed. Shortness of breath     . AMITRIPTYLINE HCL 10 MG PO TABS Oral Take 10 mg by mouth at bedtime.    Marland Kitchen AMLODIPINE BESYLATE 10 MG PO TABS Oral Take 10 mg by mouth Daily.    Marland Kitchen BENAZEPRIL-HYDROCHLOROTHIAZIDE 20-12.5 MG PO TABS Oral Take 2 tablets by mouth Daily.    Marland Kitchen CETIRIZINE HCL 10 MG PO TABS Oral Take 10 mg by mouth daily.    . CHLORPHENIRAMINE-ACETAMINOPHEN 2-325 MG PO TABS Oral Take 1 tablet by mouth every 4 (four) hours as needed. cold    . CITALOPRAM HYDROBROMIDE 40 MG PO TABS Oral Take 40 mg by mouth daily.      . CYCLOBENZAPRINE HCL 5 MG PO TABS Oral Take 5 mg by mouth at bedtime as needed.    Marland Kitchen ESOMEPRAZOLE MAGNESIUM 40 MG PO CPDR Oral  Take 1 capsule (40 mg total) by mouth daily. Take 30 min prior to breakfast daily. 30 capsule 1  . FLUTICASONE PROPIONATE 50 MCG/ACT NA SUSP Nasal Place 2 sprays into the nose daily.      Marland Kitchen FLUTICASONE PROPIONATE  HFA 110 MCG/ACT IN AERO Inhalation Inhale 1 puff into the lungs 2 (two) times daily. Patient only uses if needed    . HYDROCODONE-ACETAMINOPHEN 5-325 MG PO TABS Oral Take 2 tablets by mouth every 4 (four) hours as needed. pain    . LEVOTHYROXINE SODIUM 50 MCG PO TABS Oral Take 50 mcg by mouth daily.      Marland Kitchen LORAZEPAM 1 MG PO TABS Oral Take 1 mg by mouth 2 (two) times daily.     Marland Kitchen MAGNESIUM GLUCONATE 500 MG PO TABS Oral Take 500 mg by mouth at bedtime as needed. Patient states she doesn,t take every night    . PREDNISONE 50 MG PO TABS Oral Take 1 tablet (50 mg total) by mouth daily. 6 tablet 0  . PSEUDOEPHEDRINE-DM-GG 30-10-100 MG/5ML PO LIQD Oral Take 10 mLs by mouth every 4 (four) hours as needed. Cough/congestion    . SIMVASTATIN 40 MG PO TABS Oral Take 40 mg by  mouth daily.      Marland Kitchen VITAMIN D (ERGOCALCIFEROL) 50000 UNITS PO CAPS Oral Take 50,000 Units by mouth every 30 (thirty) days. Patient takes twice a month       BP 129/59  Pulse 78  Temp 99.1 F (37.3 C) (Oral)  Resp 18  Ht 5\' 2"  (1.575 m)  Wt 145 lb (65.772 kg)  BMI 26.52 kg/m2  SpO2 99%  Physical Exam  Nursing note and vitals reviewed. Constitutional: She is oriented to person, place, and time. She appears well-developed and well-nourished. No distress.  HENT:  Head: Normocephalic and atraumatic.  Eyes: EOM are normal.  Neck: Normal range of motion.  Cardiovascular: Normal rate, regular rhythm and normal heart sounds.   Pulmonary/Chest: Effort normal and breath sounds normal.  Abdominal: Soft. She exhibits no distension. There is no tenderness.  Musculoskeletal: Normal range of motion. She exhibits tenderness.       Mild B dorsal foot swelling.  No redness and only minimal PT.  If this is gout it is a very atypical presentation.  No specific joints affected.      Neurological: She is alert and oriented to person, place, and time.  Skin: Skin is warm and dry.  Psychiatric: She has a normal mood and affect. Judgment normal.    ED Course  Procedures (including critical care time)  Labs Reviewed - No data to display No results found.   1. Bilateral foot pain       MDM  Doubt gout etiology rx-prednisone 50 mg x 5 days.  Has hydrocodone.  F/y with dr. Charlsie Merles or the MD of your choice.        Evalina Field, [place] 10/08/11 1905

## 2011-10-08 NOTE — ED Notes (Signed)
bil  Ankle swelling Left greater than right.  Dx with gout.  Pain on rt ankle worse than left also  No injury

## 2011-11-01 ENCOUNTER — Encounter: Payer: Self-pay | Admitting: *Deleted

## 2011-11-08 ENCOUNTER — Other Ambulatory Visit (HOSPITAL_COMMUNITY): Payer: Self-pay | Admitting: Nurse Practitioner

## 2011-11-08 ENCOUNTER — Ambulatory Visit (HOSPITAL_COMMUNITY)
Admission: RE | Admit: 2011-11-08 | Discharge: 2011-11-08 | Disposition: A | Discharge status: Home / Self Care | Payer: Medicare Other | Source: Ambulatory Visit | Attending: Nurse Practitioner | Admitting: Nurse Practitioner

## 2011-11-08 DIAGNOSIS — R079 Chest pain, unspecified: Secondary | ICD-10-CM | POA: Insufficient documentation

## 2011-11-08 DIAGNOSIS — R52 Pain, unspecified: Secondary | ICD-10-CM

## 2011-11-08 DIAGNOSIS — J9819 Other pulmonary collapse: Secondary | ICD-10-CM | POA: Insufficient documentation

## 2011-11-12 ENCOUNTER — Encounter: Payer: Self-pay | Admitting: Gastroenterology

## 2011-11-14 ENCOUNTER — Encounter: Payer: Self-pay | Admitting: Gastroenterology

## 2011-11-14 ENCOUNTER — Ambulatory Visit (INDEPENDENT_AMBULATORY_CARE_PROVIDER_SITE_OTHER): Payer: Medicare Other | Admitting: Gastroenterology

## 2011-11-14 VITALS — BP 157/82 | HR 88 | Temp 98.5°F | Ht 62.0 in | Wt 145.0 lb

## 2011-11-14 DIAGNOSIS — R1013 Epigastric pain: Secondary | ICD-10-CM | POA: Insufficient documentation

## 2011-11-14 DIAGNOSIS — Z8719 Personal history of other diseases of the digestive system: Secondary | ICD-10-CM

## 2011-11-14 DIAGNOSIS — K219 Gastro-esophageal reflux disease without esophagitis: Secondary | ICD-10-CM

## 2011-11-14 DIAGNOSIS — R300 Dysuria: Secondary | ICD-10-CM

## 2011-11-14 HISTORY — DX: Personal history of other diseases of the digestive system: Z87.19

## 2011-11-14 LAB — CBC WITH DIFFERENTIAL/PLATELET
Basophils Absolute: 0 10*3/uL (ref 0.0–0.1)
Eosinophils Relative: 3 % (ref 0–5)
Lymphocytes Relative: 36 % (ref 12–46)
Lymphs Abs: 3.1 10*3/uL (ref 0.7–4.0)
MCV: 87.1 fL (ref 78.0–100.0)
Neutro Abs: 4.3 10*3/uL (ref 1.7–7.7)
Neutrophils Relative %: 50 % (ref 43–77)
Platelets: 304 10*3/uL (ref 150–400)
RBC: 4.48 MIL/uL (ref 3.87–5.11)
RDW: 13.1 % (ref 11.5–15.5)
WBC: 8.4 10*3/uL (ref 4.0–10.5)

## 2011-11-14 LAB — COMPREHENSIVE METABOLIC PANEL
ALT: 29 U/L (ref 0–35)
AST: 27 U/L (ref 0–37)
CO2: 28 mEq/L (ref 19–32)
Calcium: 10.1 mg/dL (ref 8.4–10.5)
Chloride: 100 mEq/L (ref 96–112)
Creat: 0.99 mg/dL (ref 0.50–1.10)
Sodium: 137 mEq/L (ref 135–145)
Total Protein: 7.5 g/dL (ref 6.0–8.3)

## 2011-11-14 NOTE — Progress Notes (Signed)
Faxed to PCP

## 2011-11-14 NOTE — Progress Notes (Signed)
Primary Care Physician: Ninfa Linden, FNP  Primary Gastroenterologist:  Jonette Eva, MD   Chief Complaint  Patient presents with  . Abdominal Pain    HPI: Kelly Hebert is a 71 y.o. female here for further evaluation of abdominal pain. Patient was last seen in 07/2011. Overall doing well except for breakthrough GERD. Nexium increased to BID. She had EGD/dilatation X 2 in 02/2011.  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 Could not pass diagnostic gastroscope through pylorus due to stenosis and dilation was performed to 10-58mm. Bx showed gastritis felt to be due to ASA and Mobic use. 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 She had repeat EGD/ED on 03/20/11. Dilation of distal esophagus and pylorus stenosis both dilated again. Patient developed epigastric pain after dilation. UGI showed probable pylorospasm, no evidence of perforation.   Three weeks ago she started having epigastric pain with meals, liquids, and medications. Burning sensation. Feels nauseated, feels like needs to belch but cannot. No dysphagia/odynophagia. BM good. No melena, brbpr. Some dysuria with bad odor off/on for three weeks.   Increase in synthroid three weeks ago. Nexium 40mg  BID since last ov. No weight loss.   Current Outpatient Prescriptions  Medication Sig Dispense Refill  . albuterol (PROVENTIL HFA;VENTOLIN HFA) 108 (90 BASE) MCG/ACT inhaler Inhale 2 puffs into the lungs every 6 (six) hours as needed. Shortness of breath       . citalopram (CELEXA) 40 MG tablet Take 40 mg by mouth daily.        Marland Kitchen esomeprazole (NEXIUM) 40 MG capsule Take 1 capsule (40 mg total) by mouth daily. Take 30 min prior to breakfast daily.  30 capsule  1  . fluticasone (FLONASE) 50 MCG/ACT nasal spray Place 2 sprays into the nose daily.        . fluticasone (FLOVENT HFA) 110 MCG/ACT inhaler Inhale 1 puff into the lungs 2 (two) times daily. Patient only uses if needed        . HYDROcodone-acetaminophen (NORCO) 5-325 MG per tablet Take 2 tablets by mouth every 4 (four) hours as needed. pain      . levothyroxine (SYNTHROID, LEVOTHROID) 75 MCG tablet Take 75 mcg by mouth daily.      Marland Kitchen LORazepam (ATIVAN) 1 MG tablet Take 1 mg by mouth 2 (two) times daily.       . simvastatin (ZOCOR) 40 MG tablet Take 40 mg by mouth daily.        . Vitamin D, Ergocalciferol, (DRISDOL) 50000 UNITS CAPS Take 50,000 Units by mouth every 30 (thirty) days. Patient takes twice a month         Allergies as of 11/14/2011 - Review Complete 11/14/2011  Allergen Reaction Noted  . Ivp dye (iodinated diagnostic agents) Hives and Itching 01/06/2011   Past Medical History  Diagnosis Date  . Hypercholesteremia   . Hypertension   . Acid reflux   . Anxiety   . Depression   . COPD (chronic obstructive pulmonary disease)   . S/P colonoscopy 2003    Dr. Maryruth Bun: normal colon, normal path.   . Renal disorder     pelvic kidney  . Gout    Past Surgical History  Procedure Date  . Abdominal hysterectomy     complete  . Rotator cuff repair   . Laparoscopy 2000    [place]-stomach pain-pt reports negative  . Colonoscopy 03/09/2011    sigmoid diverticula, small internal hemorrhoids.   . Esophagogastroduodenoscopy  03/09/2011    Stricture in the distal esophagus/ Stenosis at the pylorus/ Mild gastritis/ Hiatal hernia. gastric bx showed gastritis but no H.pylori  . Esophagogastroduodenoscopy 03/20/11    stricture distal esophgus and stenosis at pyloris likely from NSAIDs. s/p dialtion.    Family History  Problem Relation Age of Onset  . Kidney disease Sister   . Cirrhosis Sister     Non-etoh  . Cirrhosis Brother     ?etoh  . Colon cancer Neg Hx    History   Social History  . Marital Status: Married    Spouse Name: N/A    Number of Children: 3  . Years of Education: N/A   Occupational History  . retired Designer, fashion/clothing    Social History Main Topics  . Smoking status: Never Smoker   .  Smokeless tobacco: None  . Alcohol Use: No  . Drug Use: No  . Sexually Active: None   Other Topics Concern  . None   Social History Narrative  . None     ROS:  General: Negative for anorexia, weight loss, fever, chills, fatigue, weakness. ENT: Negative for hoarseness, difficulty swallowing , nasal congestion. CV: Negative for chest pain, angina, palpitations, dyspnea on exertion, peripheral edema.  Respiratory: Negative for dyspnea at rest, dyspnea on exertion, cough, sputum, wheezing.  GI: See history of present illness. GU:  Negative for  hematuria, urinary incontinence, urinary frequency, nocturnal urination. See hpi. Endo: Negative for unusual weight change.    Physical Examination:   BP 157/82  Pulse 88  Temp 98.5 F (36.9 C) (Temporal)  Ht 5\' 2"  (1.575 m)  Wt 145 lb (65.772 kg)  BMI 26.52 kg/m2  General: Well-nourished, well-developed in no acute distress. Accompanied by spouse.  Eyes: No icterus. Mouth: Oropharyngeal mucosa moist and pink , no lesions erythema or exudate. Lungs: Clear to auscultation bilaterally.  Heart: Regular rate and rhythm, no murmurs rubs or gallops.  Abdomen: Bowel sounds are normal, moderate epigastric tenderness, nondistended, no hepatosplenomegaly or masses, no abdominal bruits or hernia , no rebound or guarding.   Extremities: No lower extremity edema. No clubbing or deformities. Neuro: Alert and oriented x 4   Skin: Warm and dry, no jaundice.   Psych: Alert and cooperative, normal mood and affect.

## 2011-11-14 NOTE — Patient Instructions (Addendum)
Please have your blood work done and collect urine sample. I will discuss your case with Dr. Darrick Penna and get in touch with you about further work-up of your abdominal pain.

## 2011-11-14 NOTE — Assessment & Plan Note (Signed)
Continue Nexium BID for now.

## 2011-11-14 NOTE — Assessment & Plan Note (Signed)
Recent onset dysuria. Check u/a wit culture reflex.

## 2011-11-14 NOTE — Assessment & Plan Note (Signed)
Recurrent epigastric pain, GERD in setting of h/o pyloric channel stenosis and esophageal stricture both dilated twice in 02/2011. Patient has postprandial symptoms which likely due to restenosis of her pyloric channel. I doubt biliary but will check labs. Plan for EGD with dilation in near future with Dr. Darrick Penna.  I have discussed the risks, alternatives, benefits with regards to but not limited to the risk of reaction to medication, bleeding, infection, perforation and the patient is agreeable to proceed. Written consent to be obtained.

## 2011-11-15 ENCOUNTER — Telehealth: Payer: Self-pay | Admitting: Gastroenterology

## 2011-11-15 ENCOUNTER — Other Ambulatory Visit: Payer: Self-pay | Admitting: Gastroenterology

## 2011-11-15 ENCOUNTER — Encounter (HOSPITAL_COMMUNITY): Payer: Self-pay | Admitting: Pharmacy Technician

## 2011-11-15 DIAGNOSIS — Z8719 Personal history of other diseases of the digestive system: Secondary | ICD-10-CM

## 2011-11-15 DIAGNOSIS — R1013 Epigastric pain: Secondary | ICD-10-CM

## 2011-11-15 DIAGNOSIS — K219 Gastro-esophageal reflux disease without esophagitis: Secondary | ICD-10-CM

## 2011-11-15 LAB — URINALYSIS W MICROSCOPIC + REFLEX CULTURE
Bilirubin Urine: NEGATIVE
Casts: NONE SEEN
Glucose, UA: NEGATIVE mg/dL
Hgb urine dipstick: NEGATIVE
Ketones, ur: NEGATIVE mg/dL
Leukocytes, UA: NEGATIVE
Protein, ur: NEGATIVE mg/dL
pH: 6 (ref 5.0–8.0)

## 2011-11-15 NOTE — Telephone Encounter (Signed)
I did not call her. ?Nurse or Soledad Gerlach?

## 2011-11-15 NOTE — Telephone Encounter (Signed)
Patient is scheduled for EGD w/[facility] on Nov 1st at 2:00 I have mailed her instructions and she is aware

## 2011-11-15 NOTE — Progress Notes (Signed)
Quick Note:  Please let pt know her labs and urine results are ok. EGD as planned. ______

## 2011-11-15 NOTE — Telephone Encounter (Signed)
Message copied by Glendora Score on Thu Nov 15, 2011  9:55 AM ------      Message from: Tiffany Kocher      Created: Wed Nov 14, 2011  1:50 PM       Discussed with slf after pt left today. slf wants to do EGD with pyloric channel dilation on patient. Will you please make arrangements. I discussed with pt that this was likely option.

## 2011-11-15 NOTE — Progress Notes (Signed)
Quick Note:  Called and informed pt. ______ 

## 2011-11-15 NOTE — Telephone Encounter (Signed)
Pt called this morning. She said she was returning [facility] call. Please call her at [phone]

## 2011-11-22 MED ORDER — SODIUM CHLORIDE 0.45 % IV SOLN
INTRAVENOUS | Status: DC
  Administered 2011-11-23: 1000 mL via INTRAVENOUS

## 2011-11-23 ENCOUNTER — Encounter (HOSPITAL_COMMUNITY)
Admission: RE | Disposition: A | Discharge status: Home / Self Care | Payer: Self-pay | Source: Ambulatory Visit | Attending: Gastroenterology

## 2011-11-23 ENCOUNTER — Encounter (HOSPITAL_COMMUNITY): Payer: Self-pay | Admitting: *Deleted

## 2011-11-23 ENCOUNTER — Ambulatory Visit (HOSPITAL_COMMUNITY)
Admission: RE | Admit: 2011-11-23 | Discharge: 2011-11-23 | Disposition: A | Discharge status: Home / Self Care | Payer: Medicare Other | Source: Ambulatory Visit | Attending: Gastroenterology | Admitting: Gastroenterology

## 2011-11-23 DIAGNOSIS — K311 Adult hypertrophic pyloric stenosis: Secondary | ICD-10-CM

## 2011-11-23 DIAGNOSIS — R1013 Epigastric pain: Secondary | ICD-10-CM

## 2011-11-23 DIAGNOSIS — Z8719 Personal history of other diseases of the digestive system: Secondary | ICD-10-CM

## 2011-11-23 DIAGNOSIS — J4489 Other specified chronic obstructive pulmonary disease: Secondary | ICD-10-CM | POA: Insufficient documentation

## 2011-11-23 DIAGNOSIS — K449 Diaphragmatic hernia without obstruction or gangrene: Secondary | ICD-10-CM | POA: Insufficient documentation

## 2011-11-23 DIAGNOSIS — K219 Gastro-esophageal reflux disease without esophagitis: Secondary | ICD-10-CM

## 2011-11-23 DIAGNOSIS — I10 Essential (primary) hypertension: Secondary | ICD-10-CM | POA: Insufficient documentation

## 2011-11-23 DIAGNOSIS — J449 Chronic obstructive pulmonary disease, unspecified: Secondary | ICD-10-CM | POA: Insufficient documentation

## 2011-11-23 DIAGNOSIS — D131 Benign neoplasm of stomach: Secondary | ICD-10-CM

## 2011-11-23 DIAGNOSIS — R109 Unspecified abdominal pain: Secondary | ICD-10-CM | POA: Insufficient documentation

## 2011-11-23 DIAGNOSIS — R6881 Early satiety: Secondary | ICD-10-CM | POA: Insufficient documentation

## 2011-11-23 DIAGNOSIS — K222 Esophageal obstruction: Secondary | ICD-10-CM | POA: Insufficient documentation

## 2011-11-23 HISTORY — PX: ESOPHAGOGASTRODUODENOSCOPY (EGD) WITH ESOPHAGEAL DILATION: SHX5812

## 2011-11-23 SURGERY — ESOPHAGOGASTRODUODENOSCOPY (EGD) WITH ESOPHAGEAL DILATION
Anesthesia: Moderate Sedation

## 2011-11-23 MED ORDER — SIMETHICONE 40 MG/0.6ML PO SUSP
ORAL | Status: DC | PRN
  Administered 2011-11-23: 15:00:00

## 2011-11-23 MED ORDER — MEPERIDINE HCL 100 MG/ML IJ SOLN
INTRAMUSCULAR | Status: AC
  Filled 2011-11-23: qty 1

## 2011-11-23 MED ORDER — MIDAZOLAM HCL 5 MG/5ML IJ SOLN
INTRAMUSCULAR | Status: DC | PRN
  Administered 2011-11-23: 1 mg via INTRAVENOUS
  Administered 2011-11-23 (×2): 2 mg via INTRAVENOUS

## 2011-11-23 MED ORDER — MIDAZOLAM HCL 5 MG/5ML IJ SOLN
INTRAMUSCULAR | Status: AC
  Filled 2011-11-23: qty 10

## 2011-11-23 MED ORDER — MEPERIDINE HCL 100 MG/ML IJ SOLN
INTRAMUSCULAR | Status: DC | PRN
  Administered 2011-11-23 (×3): 25 mg via INTRAVENOUS

## 2011-11-23 NOTE — Op Note (Signed)
[facility] [address] [place], [address]   ENDOSCOPY PROCEDURE REPORT  PATIENT: Kelly Hebert, Kelly Hebert  MR#: [id number] BIRTHDATE: 07-25-1940 , 71  yrs. old GENDER: Female  ENDOSCOPIST: Jonette Eva, MD REFFERED AO:ZHYQM Patterson, NP  PROCEDURE DATE:  11/23/2011 PROCEDURE:   EGD with dilatation over guidewire and EGD with balloon dilatation  INDICATIONS:1.  early saTIETY and abd pain after eating.  PMHx: EGD/DIL & PYLORIC DILATION FEB 2013. MEDICATIONS: Demerol 75 mg IV and Versed 5 mg IV TOPICAL ANESTHETIC: Cetacaine Spray  DESCRIPTION OF PROCEDURE:   After the risks benefits and alternatives of the procedure were thoroughly explained, informed consent was obtained.  The EG-2990i ([id number])  endoscope was introduced through the mouth and advanced to the second portion of the duodenum.  The instrument was slowly withdrawn as the mucosa was carefully examined.  Prior to withdrawal of the scope, the guidwire was placed.  The esophagus was dilated successfully.  The patient was recovered in endoscopy and discharged home in satisfactory condition.      ESOPHAGUS: A stricture was found at the gastroesophageal junction. The stenosis was traversable with the endoscope.   A small hiatal hernia was noted.  STOMACH: Small sessile polyp was found in the gastric body and gastric fundus.   Moderate and traversable stenosis was found at the pylorus.  DUODENUM: The duodenal mucosa showed no abnormalities in the bulb and second portion of the duodenum.   Dilation was then performed at the gastroesphageal junction  Dilator: Savary over guidewire Size(s): 11-14 MM Resistance: moderate Heme: none Dilator: Balloon Size(s): 11-12 MM TTS Resistance: minimal  COMPLICATIONS: There were no complications.   ENDOSCOPIC IMPRESSION: 1.   Stricture was found at the gastroesophageal junction 2.   Small hiatal hernia 3.   Sessile polyp was found in the gastric body and  gastric fundus 4.   Stenosis was found at the pylorus 5.   The duodenal mucosa showed no abnormalities in the bulb and second portion of the duodenum  RECOMMENDATIONS: UPPER ENDOSCOPY WITH DILATION on Monday, NOV 11.  CONTINUE NEXIUM FOREVER.  TAKE 30 MINUTES PRIOR TO YOUR FIRST MEAL.  FOLLOW A LOW FAT DIET.  FOLLOW UP IN 3 MOS.      _______________________________ Rosalie DoctorJonette Eva, MD 11/23/2011 4:25 PM      PATIENT NAME:  Kelly Hebert, Kelly Hebert MR#: [id number]

## 2011-11-23 NOTE — H&P (Signed)
Primary Care Physician:  Ninfa Linden, FNP Primary Gastroenterologist:  Dr. Darrick Penna  Pre-Procedure History & Physical: HPI:  Kelly Hebert is a 71 y.o. female here for DYSPHAGIA.   Past Medical History  Diagnosis Date  . Hypercholesteremia   . Hypertension   . Acid reflux   . Anxiety   . Depression   . COPD (chronic obstructive pulmonary disease)   . S/P colonoscopy 2003    Dr. Maryruth Bun: normal colon, normal path.   . Renal disorder     pelvic kidney  . Gout     Past Surgical History  Procedure Date  . Abdominal hysterectomy     complete  . Rotator cuff repair   . Laparoscopy 2000    [place]-stomach pain-pt reports negative  . Colonoscopy 03/09/2011    sigmoid diverticula, small internal hemorrhoids.   . Esophagogastroduodenoscopy  03/09/2011    Stricture in the distal esophagus/ Stenosis at the pylorus/ Mild gastritis/ Hiatal hernia. gastric bx showed gastritis but no H.pylori  . Esophagogastroduodenoscopy 03/20/11    stricture distal esophgus and stenosis at pyloris likely from NSAIDs. s/p dialtion.     Prior to Admission medications   Medication Sig Start Date End Date Taking? Authorizing Provider  albuterol (PROVENTIL HFA;VENTOLIN HFA) 108 (90 BASE) MCG/ACT inhaler Inhale 2 puffs into the lungs every 6 (six) hours as needed. Shortness of breath    Yes Historical Provider, MD  amLODipine (NORVASC) 10 MG tablet Take 10 mg by mouth daily.   Yes Historical Provider, MD  benazepril-hydrochlorthiazide (LOTENSIN HCT) 20-12.5 MG per tablet Take 1 tablet by mouth Daily. 10/03/11  Yes Historical Provider, MD  citalopram (CELEXA) 40 MG tablet Take 40 mg by mouth daily.     Yes Historical Provider, MD  esomeprazole (NEXIUM) 40 MG capsule Take 1 capsule (40 mg total) by mouth daily. Take 30 min prior to breakfast daily. 02/19/11 02/19/12 Yes Nira Retort, NP  fluticasone (FLONASE) 50 MCG/ACT nasal spray Place 2 sprays into the nose daily.     Yes Historical Provider, MD    fluticasone (FLOVENT HFA) 110 MCG/ACT inhaler Inhale 1 puff into the lungs 2 (two) times daily as needed. Shortness of breath.   Yes Historical Provider, MD  HYDROcodone-acetaminophen (NORCO) 5-325 MG per tablet Take 2 tablets by mouth every 4 (four) hours as needed. pain   Yes Historical Provider, MD  levothyroxine (SYNTHROID, LEVOTHROID) 75 MCG tablet Take 75 mcg by mouth daily.   Yes Historical Provider, MD  LORazepam (ATIVAN) 1 MG tablet Take 1 mg by mouth 2 (two) times daily.    Yes Historical Provider, MD  Polyethyl Glycol-Propyl Glycol (SYSTANE OP) Place 1 drop into both eyes 2 (two) times daily.   Yes Historical Provider, MD  simvastatin (ZOCOR) 40 MG tablet Take 40 mg by mouth daily.     Yes Historical Provider, MD  Vitamin D, Ergocalciferol, (DRISDOL) 50000 UNITS CAPS Take 50,000 Units by mouth every 14 (fourteen) days. Patient takes twice a month   Yes Historical Provider, MD  VOLTAREN 1 % GEL Apply 2 g topically 4 (four) times daily as needed. Pain. 08/13/11  Yes Historical Provider, MD    Allergies as of 11/15/2011 - Review Complete 11/15/2011  Allergen Reaction Noted  . Ivp dye (iodinated diagnostic agents) Hives and Itching 01/06/2011    Family History  Problem Relation Age of Onset  . Kidney disease Sister   . Cirrhosis Sister     Non-etoh  . Cirrhosis Brother     ?etoh  .  Colon cancer Neg Hx     History   Social History  . Marital Status: Married    Spouse Name: N/A    Number of Children: 3  . Years of Education: N/A   Occupational History  . retired Designer, fashion/clothing    Social History Main Topics  . Smoking status: Never Smoker   . Smokeless tobacco: Not on file  . Alcohol Use: No  . Drug Use: No  . Sexually Active: Not on file   Other Topics Concern  . Not on file   Social History Narrative  . No narrative on file    Review of Systems: See HPI, otherwise negative ROS   Physical Exam: BP 143/80  Pulse 95  Temp 98.5 F (36.9 C) (Oral)  Resp 18  Ht  5\' 2"  (1.575 m)  Wt 145 lb (65.772 kg)  BMI 26.52 kg/m2  SpO2 98% General:   Alert,  pleasant and cooperative in NAD Head:  Normocephalic and atraumatic. Neck:  Supple; Lungs:  Clear throughout to auscultation.    Heart:  Regular rate and rhythm. Abdomen:  Soft, nontender and nondistended. Normal bowel sounds, without guarding, and without rebound.   Neurologic:  Alert and  oriented x4;  grossly normal neurologically.  Impression/Plan:    DYSPHAGIA  PLAN:  EGD/DIL TODAY

## 2011-11-26 ENCOUNTER — Other Ambulatory Visit: Payer: Self-pay | Admitting: Gastroenterology

## 2011-11-26 ENCOUNTER — Telehealth: Payer: Self-pay | Admitting: Gastroenterology

## 2011-11-26 ENCOUNTER — Encounter (HOSPITAL_COMMUNITY): Payer: Self-pay | Admitting: Pharmacy Technician

## 2011-11-26 DIAGNOSIS — K222 Esophageal obstruction: Secondary | ICD-10-CM

## 2011-11-26 NOTE — Telephone Encounter (Signed)
Message copied by Glendora Score on Mon Nov 26, 2011 10:09 AM ------      Message from: [place]      Created: Fri Nov 23, 2011  3:18 PM       Needs egd/dil 11/11

## 2011-11-26 NOTE — Telephone Encounter (Signed)
Patient is scheduled for EGD/ED on Monday 12/03/11 and I have mailed her instructions

## 2011-11-27 ENCOUNTER — Encounter: Payer: Self-pay | Admitting: Gastroenterology

## 2011-11-28 ENCOUNTER — Telehealth: Payer: Self-pay | Admitting: Gastroenterology

## 2011-11-28 ENCOUNTER — Ambulatory Visit: Payer: Medicare Other | Admitting: Gastroenterology

## 2011-11-28 NOTE — Telephone Encounter (Signed)
Pt was a no show

## 2011-11-28 NOTE — Telephone Encounter (Signed)
PT NEEDS OPV IN FEB OR MAR 2014 FOR DYSPHAGIA.

## 2011-11-29 ENCOUNTER — Encounter (HOSPITAL_COMMUNITY): Payer: Self-pay | Admitting: Gastroenterology

## 2011-12-03 ENCOUNTER — Encounter (HOSPITAL_COMMUNITY): Payer: Self-pay

## 2011-12-03 ENCOUNTER — Ambulatory Visit (HOSPITAL_COMMUNITY)
Admission: RE | Admit: 2011-12-03 | Discharge: 2011-12-03 | Disposition: A | Discharge status: Home / Self Care | Payer: Medicare Other | Source: Ambulatory Visit | Attending: Gastroenterology | Admitting: Gastroenterology

## 2011-12-03 ENCOUNTER — Encounter (HOSPITAL_COMMUNITY)
Admission: RE | Disposition: A | Discharge status: Home / Self Care | Payer: Self-pay | Source: Ambulatory Visit | Attending: Gastroenterology

## 2011-12-03 DIAGNOSIS — J449 Chronic obstructive pulmonary disease, unspecified: Secondary | ICD-10-CM | POA: Insufficient documentation

## 2011-12-03 DIAGNOSIS — K311 Adult hypertrophic pyloric stenosis: Secondary | ICD-10-CM | POA: Insufficient documentation

## 2011-12-03 DIAGNOSIS — R6881 Early satiety: Secondary | ICD-10-CM

## 2011-12-03 DIAGNOSIS — R1310 Dysphagia, unspecified: Secondary | ICD-10-CM

## 2011-12-03 DIAGNOSIS — I10 Essential (primary) hypertension: Secondary | ICD-10-CM | POA: Insufficient documentation

## 2011-12-03 DIAGNOSIS — D131 Benign neoplasm of stomach: Secondary | ICD-10-CM | POA: Insufficient documentation

## 2011-12-03 DIAGNOSIS — K222 Esophageal obstruction: Secondary | ICD-10-CM

## 2011-12-03 DIAGNOSIS — J4489 Other specified chronic obstructive pulmonary disease: Secondary | ICD-10-CM | POA: Insufficient documentation

## 2011-12-03 HISTORY — PX: ESOPHAGOGASTRODUODENOSCOPY (EGD) WITH ESOPHAGEAL DILATION: SHX5812

## 2011-12-03 SURGERY — ESOPHAGOGASTRODUODENOSCOPY (EGD) WITH ESOPHAGEAL DILATION
Anesthesia: Moderate Sedation

## 2011-12-03 MED ORDER — SODIUM CHLORIDE 0.45 % IV SOLN
INTRAVENOUS | Status: DC
  Administered 2011-12-03: 12:00:00 via INTRAVENOUS

## 2011-12-03 MED ORDER — MIDAZOLAM HCL 5 MG/5ML IJ SOLN
INTRAMUSCULAR | Status: DC | PRN
  Administered 2011-12-03: 2 mg via INTRAVENOUS
  Administered 2011-12-03: 1 mg via INTRAVENOUS
  Administered 2011-12-03: 2 mg via INTRAVENOUS

## 2011-12-03 MED ORDER — MEPERIDINE HCL 100 MG/ML IJ SOLN
INTRAMUSCULAR | Status: DC | PRN
  Administered 2011-12-03 (×3): 25 mg via INTRAVENOUS

## 2011-12-03 MED ORDER — BUTAMBEN-TETRACAINE-BENZOCAINE 2-2-14 % EX AERO
INHALATION_SPRAY | CUTANEOUS | Status: DC | PRN
  Administered 2011-12-03: 2 via TOPICAL

## 2011-12-03 MED ORDER — MIDAZOLAM HCL 5 MG/5ML IJ SOLN
INTRAMUSCULAR | Status: AC
  Filled 2011-12-03: qty 10

## 2011-12-03 MED ORDER — MEPERIDINE HCL 100 MG/ML IJ SOLN
INTRAMUSCULAR | Status: AC
  Filled 2011-12-03: qty 1

## 2011-12-03 MED ORDER — ESOMEPRAZOLE MAGNESIUM 40 MG PO CPDR: DELAYED_RELEASE_CAPSULE | ORAL | Status: DC

## 2011-12-03 MED ORDER — STERILE WATER FOR IRRIGATION IR SOLN
Status: DC | PRN
  Administered 2011-12-03: 13:00:00

## 2011-12-03 NOTE — Op Note (Signed)
[facility] [address] [place], [address]   ENDOSCOPY PROCEDURE REPORT  PATIENT: Kelly Hebert, Kelly Hebert  MR#: [id number] BIRTHDATE: 06/19/1940 , 71  yrs. old GENDER: Female  ENDOSCOPIST: Jonette Eva, MD REFFERED GN:FAOZH Patterson, NP  PROCEDURE DATE:  12/03/2011 PROCEDURE:   EGD with dilatation over guidewire and EGD with balloon dilatation  INDICATIONS:1.  EARLY SATIETY, DYSPHAGIA PMHx: STRICTURE/PYLORIC STENOSIS. MEDICATIONS: Demerol 75 mg IV and Versed 5 mg IV TOPICAL ANESTHETIC: Cetacaine Spray  DESCRIPTION OF PROCEDURE:   After the risks benefits and alternatives of the procedure were thoroughly explained, informed consent was obtained.  The EG-2990i ([id number]) and [id number] ([id number])  endoscope was introduced through the mouth and advanced to the second portion of the duodenum.  The instrument was slowly withdrawn as the mucosa was carefully examined.  Prior to withdrawal of the scope, the guidwire was placed.  The esophagus was dilated successfully.  The patient was recovered in endoscopy and discharged home in satisfactory condition.      ESOPHAGUS: A stricture was found at the gastroesophageal junction. The stenosis was traversable with the endoscope.  STOMACH: Small polyp was found in the entire examined stomach.   A stenosis traversable after dilation was found at the pylorus. PYLORUS DIALTED WITHTTS (12 MM-15 MM). EACH HELD FOR ONE MINUTE.  DUODENUM: The duodenal mucosa showed no abnormalities.   Dilation was then performed at the gastroesphageal junction  Dilator: Savary over guidewire Size(s): 14-17 MM Resistance: moderate Heme: yes  COMPLICATIONS: There were no complications.   ENDOSCOPIC IMPRESSION: 1.   Stricture was found at the gastroesophageal junction 2.   Polyp was found in the entire examined stomach 3.   Stenosis was found at the pylorus 4.   The duodenal mucosa showed no abnormalities  RECOMMENDATIONS: CONTINUE  NEXIUM.  TAKE 30 MINUTES PRIOR TO YOUR FIRST MEAL.  FOLLOW A LOW FAT DIET.  FOLLOW UP IN 3 MOS.      _______________________________ Rosalie DoctorJonette Eva, MD 12/03/2011 1:26 PM      PATIENT NAME:  [name], [name] MR#: [id number]

## 2011-12-03 NOTE — H&P (View-Only) (Signed)
Primary Care Physician:  PATTERSON, KATHY, FNP Primary Gastroenterologist:  Dr. Saleema Weppler  Pre-Procedure History & Physical: HPI:  Kelly Hebert is a 71 y.o. female here for DYSPHAGIA.   Past Medical History  Diagnosis Date  . Hypercholesteremia   . Hypertension   . Acid reflux   . Anxiety   . Depression   . COPD (chronic obstructive pulmonary disease)   . S/P colonoscopy 2003    Dr. Kapur: normal colon, normal path.   . Renal disorder     pelvic kidney  . Gout     Past Surgical History  Procedure Date  . Abdominal hysterectomy     complete  . Rotator cuff repair   . Laparoscopy 2000    [place]-stomach pain-pt reports negative  . Colonoscopy 03/09/2011    sigmoid diverticula, small internal hemorrhoids.   . Esophagogastroduodenoscopy  03/09/2011    Stricture in the distal esophagus/ Stenosis at the pylorus/ Mild gastritis/ Hiatal hernia. gastric bx showed gastritis but no H.pylori  . Esophagogastroduodenoscopy 03/20/11    stricture distal esophgus and stenosis at pyloris likely from NSAIDs. s/p dialtion.     Prior to Admission medications   Medication Sig Start Date End Date Taking? Authorizing Provider  albuterol (PROVENTIL HFA;VENTOLIN HFA) 108 (90 BASE) MCG/ACT inhaler Inhale 2 puffs into the lungs every 6 (six) hours as needed. Shortness of breath    Yes Historical Provider, MD  amLODipine (NORVASC) 10 MG tablet Take 10 mg by mouth daily.   Yes Historical Provider, MD  benazepril-hydrochlorthiazide (LOTENSIN HCT) 20-12.5 MG per tablet Take 1 tablet by mouth Daily. 10/03/11  Yes Historical Provider, MD  citalopram (CELEXA) 40 MG tablet Take 40 mg by mouth daily.     Yes Historical Provider, MD  esomeprazole (NEXIUM) 40 MG capsule Take 1 capsule (40 mg total) by mouth daily. Take 30 min prior to breakfast daily. 02/19/11 02/19/12 Yes Anna W Sams, NP  fluticasone (FLONASE) 50 MCG/ACT nasal spray Place 2 sprays into the nose daily.     Yes Historical Provider, MD    fluticasone (FLOVENT HFA) 110 MCG/ACT inhaler Inhale 1 puff into the lungs 2 (two) times daily as needed. Shortness of breath.   Yes Historical Provider, MD  HYDROcodone-acetaminophen (NORCO) 5-325 MG per tablet Take 2 tablets by mouth every 4 (four) hours as needed. pain   Yes Historical Provider, MD  levothyroxine (SYNTHROID, LEVOTHROID) 75 MCG tablet Take 75 mcg by mouth daily.   Yes Historical Provider, MD  LORazepam (ATIVAN) 1 MG tablet Take 1 mg by mouth 2 (two) times daily.    Yes Historical Provider, MD  Polyethyl Glycol-Propyl Glycol (SYSTANE OP) Place 1 drop into both eyes 2 (two) times daily.   Yes Historical Provider, MD  simvastatin (ZOCOR) 40 MG tablet Take 40 mg by mouth daily.     Yes Historical Provider, MD  Vitamin D, Ergocalciferol, (DRISDOL) 50000 UNITS CAPS Take 50,000 Units by mouth every 14 (fourteen) days. Patient takes twice a month   Yes Historical Provider, MD  VOLTAREN 1 % GEL Apply 2 g topically 4 (four) times daily as needed. Pain. 08/13/11  Yes Historical Provider, MD    Allergies as of 11/15/2011 - Review Complete 11/15/2011  Allergen Reaction Noted  . Ivp dye (iodinated diagnostic agents) Hives and Itching 01/06/2011    Family History  Problem Relation Age of Onset  . Kidney disease Sister   . Cirrhosis Sister     Non-etoh  . Cirrhosis Brother     ?etoh  .   Colon cancer Neg Hx     History   Social History  . Marital Status: Married    Spouse Name: N/A    Number of Children: 3  . Years of Education: N/A   Occupational History  . retired [employer]iles    Social History Main Topics  . Smoking status: Never Smoker   . Smokeless tobacco: Not on file  . Alcohol Use: No  . Drug Use: No  . Sexually Active: Not on file   Other Topics Concern  . Not on file   Social History Narrative  . No narrative on file    Review of Systems: See HPI, otherwise negative ROS   Physical Exam: BP 143/80  Pulse 95  Temp 98.5 F (36.9 C) (Oral)  Resp 18  Ht  5' 2" (1.575 m)  Wt 145 lb (65.772 kg)  BMI 26.52 kg/m2  SpO2 98% General:   Alert,  pleasant and cooperative in NAD Head:  Normocephalic and atraumatic. Neck:  Supple; Lungs:  Clear throughout to auscultation.    Heart:  Regular rate and rhythm. Abdomen:  Soft, nontender and nondistended. Normal bowel sounds, without guarding, and without rebound.   Neurologic:  Alert and  oriented x4;  grossly normal neurologically.  Impression/Plan:    DYSPHAGIA  PLAN:  EGD/DIL TODAY  

## 2011-12-03 NOTE — Interval H&P Note (Signed)
History and Physical Interval Note:  12/03/2011 12:33 PM  Kelly Hebert  has presented today for surgery, with the diagnosis of Esophageal Stricture  The various methods of treatment have been discussed with the patient and family. After consideration of risks, benefits and other options for treatment, the patient has consented to  Procedure(s) (LRB) with comments: ESOPHAGOGASTRODUODENOSCOPY (EGD) WITH ESOPHAGEAL DILATION (N/A) - 2:45 as a surgical intervention .  The patient's history has been reviewed, patient examined, no change in status, stable for surgery.  I have reviewed the patient's chart and labs.  Questions were answered to the patient's satisfaction.     [employer]

## 2011-12-04 NOTE — Telephone Encounter (Signed)
Reminder in epic to follow up with SF in Feb 2014

## 2011-12-05 NOTE — Progress Notes (Signed)
EGD/DIL x2 NOV 2013-TTS PYLORUS 10-15 MM & SAVARY 11-17MM  REVIEWED.

## 2011-12-06 ENCOUNTER — Encounter (HOSPITAL_COMMUNITY): Payer: Self-pay | Admitting: Gastroenterology

## 2011-12-07 ENCOUNTER — Telehealth: Payer: Self-pay | Admitting: Gastroenterology

## 2011-12-07 NOTE — Telephone Encounter (Signed)
Called pt and she said that the right side of her throat is more sore and it feels like a lump there. It hurts when she swallows and she has to eat slow. *( Had EGD on 12/03/2011). Please advise!

## 2011-12-07 NOTE — Telephone Encounter (Signed)
Pt called this morning asking to speak with [facility] nurse. She had an EGD and her throat is sore and feels like a knot is on the side. Please advise and call patient at [phone]

## 2011-12-07 NOTE — Telephone Encounter (Signed)
Called and informed pt.  

## 2011-12-07 NOTE — Telephone Encounter (Signed)
PLEASE CALL PT.  She was dilated & may be sore from procedure. She should use Chloraseptic spray & follow a full liquid diet for the next 3 days. It will get better. CallMon if she is not improved. Go to ED IF HER Sx GET WORSE.  Full Liquid Diet A high-calorie, high-protein supplement should be used to meet your nutritional requirements when the full liquid diet is continued for more than 2 or 3 days. If this diet is to be used for an extended period of time (more than 7 days), a multivitamin should be considered.  Breads and Starches  Allowed: None are allowed except crackersWHOLE OR pureed (made into a thick, smooth soup) in soup. Cooked, refined corn, oat, rice, rye, and wheat cereals are also allowed.   Avoid: Any others.    Potatoes/Pasta/Rice  Allowed: ANY ITEM AS A SOUP OR SMALL PLATE OF MASHED POTATOES OR RICE.       Vegetables  Allowed: Strained tomato or vegetable juice. Vegetables pureed in soup.   Avoid: Any others.    Fruit  Allowed: Any strained fruit juices and fruit drinks. Include 1 serving of citrus or vitamin C-enriched fruit juice daily.   Avoid: Any others.  Meat and Meat Substitutes  Allowed: Egg  Avoid: Any meat, fish, or fowl. All cheese.  Milk  Allowed: SOY Milk beverages, including milk shakes and instant breakfast mixes. Smooth yogurt.   Avoid: Any others. Avoid dairy products if not tolerated.    Soups and Combination Foods  Allowed: Broth, strained cream soups. Strained, broth-based soups.   Avoid: Any others.    Desserts and Sweets  Allowed: flavored gelatin, tapioca, plain ice cream, sherbet, smooth pudding, junket, fruit ices, frozen ice pops, pudding pops,, frozen fudge pops, chocolate syrup. Sugar, honey, jelly, syrup.   Avoid: Any others.  Fats and Oils  Allowed: Margarine, butter, cream, sour cream, oils.   Avoid: Any others.  Beverages  Allowed: All.   Avoid: None.  Condiments  Allowed: Iodized salt, pepper,  spices, flavorings. Cocoa powder.   Avoid: Any others.    SAMPLE MEAL PLAN Breakfast   cup orange juice.   1 cup cooked wheat cereal.   1 cup  milk.   1 cup beverage (coffee or tea).   Cream or sugar, if desired.    Midmorning Snack  2 SCRAMBLED OR HARD BOILED EGG   Lunch  1 cup cream soup.    cup fruit juice.   1 cup milk.    cup custard.   1 cup beverage (coffee or tea).   Cream or sugar, if desired.    Midafternoon Snack  1 cup VANILLA SOY milk shake.  Dinner  1 cup cream soup.    cup fruit juice.   1 cup MILK    cup pudding.   1 cup beverage (coffee or tea).   Cream or sugar, if desired.  Evening Snack  1 cup supplement.  To increase calories, add sugar, cream, butter, or margarine if possible. Nutritional supplements will also increase the total calories.

## 2011-12-10 ENCOUNTER — Telehealth: Payer: Self-pay | Admitting: Gastroenterology

## 2011-12-10 NOTE — Telephone Encounter (Signed)
Working on PA

## 2011-12-10 NOTE — Telephone Encounter (Signed)
Pt came to window to say that her Insurance will not cover her Nexium because the rx says she needs to take it once a day, but she said that she is taking it twice a day. She uses [facility] in [place].

## 2011-12-10 NOTE — Telephone Encounter (Signed)
Rx was for BID.  Please call [facility] to see what the issue is here. Thanks

## 2011-12-10 NOTE — Telephone Encounter (Signed)
Called and spoke to Red[place] at the pharmacy. He said that it requires a PA and he will fax the request over.

## 2012-01-21 ENCOUNTER — Encounter: Payer: Self-pay | Admitting: *Deleted

## 2012-02-06 ENCOUNTER — Encounter: Payer: Self-pay | Admitting: Gastroenterology

## 2012-02-07 ENCOUNTER — Encounter: Payer: Self-pay | Admitting: Gastroenterology

## 2012-02-07 ENCOUNTER — Ambulatory Visit (INDEPENDENT_AMBULATORY_CARE_PROVIDER_SITE_OTHER): Payer: Medicare Other | Admitting: Gastroenterology

## 2012-02-07 ENCOUNTER — Telehealth: Payer: Self-pay | Admitting: Gastroenterology

## 2012-02-07 ENCOUNTER — Ambulatory Visit (HOSPITAL_COMMUNITY)
Admission: RE | Admit: 2012-02-07 | Discharge: 2012-02-07 | Disposition: A | Discharge status: Home / Self Care | Payer: Medicare Other | Source: Ambulatory Visit | Attending: Gastroenterology | Admitting: Gastroenterology

## 2012-02-07 VITALS — BP 132/74 | HR 91 | Temp 98.2°F | Ht 62.0 in | Wt 146.6 lb

## 2012-02-07 DIAGNOSIS — R142 Eructation: Secondary | ICD-10-CM | POA: Insufficient documentation

## 2012-02-07 DIAGNOSIS — R141 Gas pain: Secondary | ICD-10-CM | POA: Insufficient documentation

## 2012-02-07 DIAGNOSIS — R1013 Epigastric pain: Secondary | ICD-10-CM

## 2012-02-07 DIAGNOSIS — R109 Unspecified abdominal pain: Secondary | ICD-10-CM | POA: Insufficient documentation

## 2012-02-07 LAB — CBC WITH DIFFERENTIAL/PLATELET
Hemoglobin: 14.2 g/dL (ref 12.0–15.0)
Lymphocytes Relative: 34 % (ref 12–46)
Lymphs Abs: 3 10*3/uL (ref 0.7–4.0)
MCV: 90.6 fL (ref 78.0–100.0)
Neutrophils Relative %: 56 % (ref 43–77)
Platelets: 254 10*3/uL (ref 150–400)
RBC: 4.69 MIL/uL (ref 3.87–5.11)
WBC: 8.9 10*3/uL (ref 4.0–10.5)

## 2012-02-07 LAB — URINALYSIS, MICROSCOPIC ONLY: Crystals: NONE SEEN

## 2012-02-07 LAB — URINALYSIS, ROUTINE W REFLEX MICROSCOPIC
Nitrite: NEGATIVE
Specific Gravity, Urine: 1.025 (ref 1.005–1.030)
pH: 5.5 (ref 5.0–8.0)

## 2012-02-07 LAB — COMPREHENSIVE METABOLIC PANEL
Albumin: 4.8 g/dL (ref 3.5–5.2)
BUN: 13 mg/dL (ref 6–23)
CO2: 28 mEq/L (ref 19–32)
Glucose, Bld: 99 mg/dL (ref 70–99)
Sodium: 138 mEq/L (ref 135–145)
Total Bilirubin: 0.4 mg/dL (ref 0.3–1.2)
Total Protein: 8.7 g/dL — ABNORMAL HIGH (ref 6.0–8.3)

## 2012-02-07 LAB — LIPASE: Lipase: 38 U/L (ref 11–59)

## 2012-02-07 MED ORDER — DIPHENHYDRAMINE HCL 25 MG PO CAPS: ORAL_CAPSULE | ORAL | Status: DC

## 2012-02-07 MED ORDER — PREDNISONE 10 MG PO TABS: ORAL_TABLET | ORAL | Status: DC

## 2012-02-07 NOTE — Progress Notes (Signed)
Subjective:    Patient ID: Kelly Hebert, female    DOB: 05/24/1940, 72 y.o.   MRN: [id number]  PCP: PATTERSON  HPI Saturday/sun hurting on one spot: LUQ AND NOW HURTING ALL THE WAY ACROSS. HAD NAUSEA. NO VOMITING. NO CHANGE IN BOWEL HABITS. NO BREAKFAST TODAY. SAT ATE A TOMATO SANDWICH/MAYONAISSE. DOESN'T FEEL LIKE HEARTBURN. FELT LIKE SHE NEEDED TO BURP.  ABD PAIN: CRAMPING. LUQ BETTER BUT THIS AM CRAMPING WORSE IN THE MIDDLE. STILL HAS GB. HAD A HA AND TOOK A TYLENOL. NO ETOH. NO ASA, BC/GOODYS, IBUPROFEN, OR ALEVE. NO SICK CONTACT. LAST BM: LAST NIGHT. PT DENIES FEVER, CHILLS, BRBPR, melena, diarrhea, constipation, problems swallowing, heartburn or indigestion.   Past Medical History  Diagnosis Date  . Hypercholesteremia   . Hypertension   . Acid reflux   . Anxiety   . Depression   . COPD (chronic obstructive pulmonary disease)   . S/P colonoscopy 2003    Dr. Maryruth Bun: normal colon, normal path.   . Renal disorder     pelvic kidney  . Gout     Past Surgical History  Procedure Date  . Abdominal hysterectomy     complete  . Rotator cuff repair   . Laparoscopy 2000    [place]-stomach pain-pt reports negative  . Colonoscopy 03/09/2011    WUJ:[id number] diverticula, small internal hemorrhoids.   . Esophagogastroduodenoscopy  03/09/2011    [id number]:[id number] in the distal esophagus/ Stenosis at the pylorus/ Mild gastritis/ Hiatal hernia. gastric bx showed gastritis but no H.pylori  . Esophagogastroduodenoscopy 03/20/11    SLF: stricture distal esophgus and stenosis at pyloris likely from NSAIDs. s/p dialtion.   . Esophagogastroduodenoscopy (egd) with esophageal dilation 11/23/2011    SLF: Stricture was found at the gastroesophageal junction/ Small hiatal hernia/  Sessile polyp was found in the gastric body and gastric fundus/ Stenosis was found at the pylorus/ The duodenal mucosa showed no abnormalities in the bulb and second portion of the duodenum  . Esophagogastroduodenoscopy (egd)  with esophageal dilation 12/03/2011    Procedure: ESOPHAGOGASTRODUODENOSCOPY (EGD) WITH ESOPHAGEAL DILATION;  Surgeon: West Bali, MD;  Location: [facility];  Service: Endoscopy;  Laterality: N/A;  2:45   Allergies  Allergen Reactions  . Ivp Dye (Iodinated Diagnostic Agents) Hives and Itching    Pt. States this happened 4 or 5 years ago in [place].      Current Outpatient Prescriptions  Medication Sig Dispense Refill  . acetaminophen (TYLENOL) 500 MG tablet Take 500 mg by mouth every 6 (six) hours as needed. Pain      . albuterol (PROVENTIL HFA;VENTOLIN HFA) 108 (90 BASE) MCG/ACT inhaler Inhale 2 puffs into the lungs every 6 (six) hours as needed. Shortness of breath       . amLODipine (NORVASC) 10 MG tablet Take 10 mg by mouth daily.      . benazepril-hydrochlorthiazide (LOTENSIN HCT) 20-12.5 MG per tablet Take 1 tablet by mouth Daily.      . citalopram (CELEXA) 40 MG tablet Take 40 mg by mouth daily.        Marland Kitchen esomeprazole (NEXIUM) 40 MG capsule 1 po 30 minutes prior to breakfast and supper    . fluticasone (FLONASE) 50 MCG/ACT nasal spray Place 2 sprays into the nose daily.        . fluticasone (FLOVENT HFA) 110 MCG/ACT inhaler Inhale 1 puff into the lungs 2 (two) times daily as needed. Shortness of breath.      Marland Kitchen HYDROcodone-acetaminophen (NORCO) 5-325 MG per tablet Take  2 tablets by mouth every 4 (four) hours as needed. pain      . l-methylfolate-B6-B12 (METANX) 3-35-2 MG TABS Take 1 tablet by mouth daily.      Marland Kitchen levothyroxine (SYNTHROID, LEVOTHROID) 75 MCG tablet Take 75 mcg by mouth daily.      Marland Kitchen loratadine (CLARITIN) 10 MG tablet Take 10 mg by mouth daily as needed. Sinuses      . LORazepam (ATIVAN) 1 MG tablet Take 1 mg by mouth 2 (two) times daily.       Bertram Gala Glycol-Propyl Glycol (SYSTANE OP) Place 1 drop into both eyes 2 (two) times daily.      . simvastatin (ZOCOR) 40 MG tablet Take 40 mg by mouth daily.        . Vitamin D, Ergocalciferol, (DRISDOL) 50000 UNITS  CAPS Take 50,000 Units by mouth every 14 (fourteen) days. Patient takes twice a month          Review of Systems     Objective:   Physical Exam  Vitals reviewed. Constitutional: She is oriented to person, place, and time. She appears well-nourished. Distressed: MILD-PREFERS TO STAND DUE TO ABD DISCOMFORT.  HENT:  Head: Normocephalic and atraumatic.  Mouth/Throat: Oropharynx is clear and moist. No oropharyngeal exudate.  Eyes: Pupils are equal, round, and reactive to light. No scleral icterus.  Neck: Normal range of motion. Neck supple.  Cardiovascular: Normal rate and regular rhythm.   Pulmonary/Chest: Effort normal and breath sounds normal. No respiratory distress.  Abdominal: Soft. Bowel sounds are normal. She exhibits distension (MILD). There is tenderness (MILD TTP IN EPIGASTRIUM). There is no rebound and no guarding.  Musculoskeletal: Normal range of motion. She exhibits no edema.  Lymphadenopathy:    She has no cervical adenopathy.  Neurological: She is alert and oriented to person, place, and time.       NO FOCAL DEFICITS   Psychiatric: She has a normal mood and affect.          Assessment & Plan:

## 2012-02-07 NOTE — Telephone Encounter (Signed)
Called and informed pt.  

## 2012-02-07 NOTE — Telephone Encounter (Signed)
Results faxed to PCP 

## 2012-02-07 NOTE — Assessment & Plan Note (Signed)
ACUTE LUQ ABD PAIN NOW RADIATING TO EPIGASTRIUM AND RUQ AND ASSOCIATED WITH NAUSEA AND DISTENTION  STAT LABS/AAS. CT SCAN JAN 17 DUE TO IVC ALLERGY. NEEDS PREMED PRIOR TO SCAN. DISCUSSED WITH DR. Tyron Russell. FULL LIQUID DIET WILL CALL PT WITH RESULTS TODAY OPV IN 1 MO

## 2012-02-07 NOTE — Patient Instructions (Signed)
GET YOUR LABS AND PLAIN ABD FILMS TODAY. I WILL CALL YOU WITH THE RESULTS.  YOU NEED PREDNISONE AND BENADRYL PRIOR TO HAVING A CT SCAN BECAUSE YOU HAD AN ALLERGIC REACTION TO THE DYE.  FOLLOW A FULL LIQUID DIET. SEE INFO BELOW.  YOU HAVE A FOLLOW UP APPT IN ONE MONTH.  Full Liquid Diet A high-calorie, high-protein supplement should be used to meet your nutritional requirements when the full liquid diet is continued for more than 2 or 3 days. If this diet is to be used for an extended period of time (more than 7 days), a multivitamin should be considered.  Breads and Starches  Allowed: None are allowed except crackersWHOLE OR pureed (made into a thick, smooth soup) in soup. Cooked, refined corn, oat, rice, rye, and wheat cereals are also allowed.   Avoid: Any others.    Potatoes/Pasta/Rice  Allowed: ANY ITEM AS A SOUP OR SMALL PLATE OF MASHED POTATOES OR RICE.       Vegetables  Allowed: Strained tomato or vegetable juice. Vegetables pureed in soup.   Avoid: Any others.    Fruit  Allowed: Any strained fruit juices and fruit drinks. Include 1 serving of citrus or vitamin C-enriched fruit juice daily.   Avoid: Any others.  Meat and Meat Substitutes  Allowed: Egg  Avoid: Any meat, fish, or fowl. All cheese.  Milk  Allowed: SOY Milk beverages, including milk shakes and instant breakfast mixes. Smooth yogurt.   Avoid: Any others. Avoid dairy products if not tolerated.    Soups and Combination Foods  Allowed: Broth, strained cream soups. Strained, broth-based soups.   Avoid: Any others.    Desserts and Sweets  Allowed: flavored gelatin, tapioca, plain LACTOSE FREE ice cream, sherbet, smooth pudding, junket, fruit ices, frozen ice pops, pudding pops,, frozen fudge pops, chocolate syrup. Sugar, honey, jelly, syrup.   Avoid: Any others.  Fats and Oils  Allowed: Margarine, butter, cream, sour cream, oils.   Avoid: Any others.  Beverages  Allowed: All.    Avoid: None.  Condiments  Allowed: Iodized salt, pepper, spices, flavorings. Cocoa powder.   Avoid: Any others.    SAMPLE MEAL PLAN Breakfast   cup orange juice.   1 cup cooked wheat cereal.   1 cup SOY milk.   1 cup beverage (coffee or tea).   Cream or sugar, if desired.    Midmorning Snack  2 SCRAMBLED OR HARD BOILED EGG   Lunch  1 cup cream soup.    cup fruit juice.   1 cup SOY milk.    cup custard.   1 cup beverage (coffee or tea).   Cream or sugar, if desired.    Midafternoon Snack  1 cup VANILLA SOY milk shake.  Dinner  1 cup cream soup.    cup fruit juice.   1 cup SOY milk.    cup pudding.   1 cup beverage (coffee or tea).   Cream or sugar, if desired.  Evening Snack  1 cup supplement.  To increase calories, add sugar, cream, butter, or margarine if possible. Nutritional supplements will also increase the total calories.

## 2012-02-07 NOTE — Progress Notes (Signed)
Faxed to PCP

## 2012-02-07 NOTE — Telephone Encounter (Signed)
PLEASE CALL PT.   HER LABS SHOW AN ELEVATED PROTEIN AND CALCIUM. SHE SHOULD SEE HER PCP FOR A WORKUP FOR ELEVATED CALCIUM AND PROTEIN. HER ABDOMINAL FILMS SHOW SHE NEEDS TO A MODERATE AMOUNT OF STOOL IN HER COLON. AN ELEVATED CALCIUM LEVEL CAN CAUSE CONSTIPATION. FOLLOW A FULL LIQUID DIET. START MIRALAX 17 GMS TID FOR 3 DAYS. PT SHOULD TAKE A DOSE BEFORE 6 PM AND AROUND 10 PM TODAY. THEN START TAKING MIRALAX WITH BREAKFAST LUNCH AND DINNER.  IF SHE IS TAKING A CALCIUM SUPPLEMENT,SHE SHOULD STOP. I WILL CALL HER TOMORROW WHEN HER WHEN I GET HER CT RESULTS .

## 2012-02-08 ENCOUNTER — Ambulatory Visit (HOSPITAL_COMMUNITY)
Admission: RE | Admit: 2012-02-08 | Discharge: 2012-02-08 | Disposition: A | Discharge status: Home / Self Care | Payer: Medicare Other | Source: Ambulatory Visit | Attending: Gastroenterology | Admitting: Gastroenterology

## 2012-02-08 DIAGNOSIS — K7689 Other specified diseases of liver: Secondary | ICD-10-CM | POA: Insufficient documentation

## 2012-02-08 DIAGNOSIS — R1013 Epigastric pain: Secondary | ICD-10-CM

## 2012-02-08 DIAGNOSIS — R109 Unspecified abdominal pain: Secondary | ICD-10-CM | POA: Insufficient documentation

## 2012-02-08 MED ORDER — IOHEXOL 300 MG/ML  SOLN
100.0000 mL | Freq: Once | INTRAMUSCULAR | Status: AC | PRN
  Administered 2012-02-08: 100 mL via INTRAVENOUS

## 2012-02-09 NOTE — Telephone Encounter (Addendum)
Called patient TO DISCUSS RESULTS. LVM-CALL [phone] TO DISCUSS. NEEDS TO SEE PCP FOR ELEVATED CALCIUM AND MIRALAX FOR CONSTIPATION.  Spoke to pt jan 18. Explained results. Had a BM felt better.

## 2012-02-13 NOTE — Progress Notes (Signed)
Pt is aware of OV on 2/20 at 2 with SF and appt card was mailed

## 2012-03-12 ENCOUNTER — Encounter: Payer: Self-pay | Admitting: Gastroenterology

## 2012-03-13 ENCOUNTER — Ambulatory Visit: Payer: Medicare Other | Admitting: Gastroenterology

## 2012-04-17 ENCOUNTER — Encounter: Payer: Self-pay | Admitting: Gastroenterology

## 2012-04-17 ENCOUNTER — Ambulatory Visit (INDEPENDENT_AMBULATORY_CARE_PROVIDER_SITE_OTHER): Payer: Medicare Other | Admitting: Gastroenterology

## 2012-04-17 VITALS — BP 136/77 | HR 91 | Temp 98.3°F | Ht 62.0 in | Wt 146.8 lb

## 2012-04-17 DIAGNOSIS — R1310 Dysphagia, unspecified: Secondary | ICD-10-CM

## 2012-04-17 DIAGNOSIS — K219 Gastro-esophageal reflux disease without esophagitis: Secondary | ICD-10-CM

## 2012-04-17 DIAGNOSIS — K5900 Constipation, unspecified: Secondary | ICD-10-CM

## 2012-04-17 NOTE — Assessment & Plan Note (Signed)
DRINK WATER HIGH FIBER DIET ADD LINZESS 145 MCG DAILY MIRALAX AS NEEDED FOR A GOOD BM OPV IN 4 MOS

## 2012-04-17 NOTE — Patient Instructions (Signed)
ADD LINZESS 145 MCG DAILY. IT CAN CAUSE DIARRHEA  USE MIRALAX AS NEEDED FOR A GOOD BM.  FOLLOW A HIGH FIBER DIET. AVOID ITEMS THAT CAUSE BLOATING AND GAS. SEE INFO BELOW.  DRINK WATER TO KEEP HER URINE LIGHT YELLOW.  FOLLOW UP IN JUL 2014.  High-Fiber Diet A high-fiber diet changes your normal diet to include more whole grains, legumes, fruits, and vegetables. Changes in the diet involve replacing refined carbohydrates with unrefined foods. The calorie level of the diet is essentially unchanged. The Dietary Reference Intake (recommended amount) for adult males is 38 grams per day. For adult females, it is 25 grams per day. Pregnant and lactating women should consume 28 grams of fiber per day. Fiber is the intact part of a plant that is not broken down during digestion. Functional fiber is fiber that has been isolated from the plant to provide a beneficial effect in the body. PURPOSE  Increase stool bulk.   Ease and regulate bowel movements.   Lower cholesterol.  INDICATIONS THAT YOU NEED MORE FIBER  Constipation and hemorrhoids.   Uncomplicated diverticulosis (intestine condition) and irritable bowel syndrome.   Weight management.   As a protective measure against hardening of the arteries (atherosclerosis), diabetes, and cancer.   DO NOT USE WITH:  Acute diverticulitis (intestine infection).   Partial small bowel obstructions.   Complicated diverticular disease involving bleeding, rupture (perforation), or abscess (boil, furuncle).   Presence of autonomic neuropathy (nerve damage) or gastroparesis (stomach cannot empty itself).    GUIDELINES FOR INCREASING FIBER IN THE DIET  Start adding fiber to the diet slowly. A gradual increase of about 5 more grams (2 slices of whole-wheat bread, 2 servings of most fruits or vegetables, or 1 bowl of high-fiber cereal) per day is best. Too rapid an increase in fiber may result in constipation, flatulence, and bloating.   Drink enough  water and fluids to keep your urine clear or pale yellow. Water, juice, or caffeine-free drinks are recommended. Not drinking enough fluid may cause constipation.   Eat a variety of high-fiber foods rather than one type of fiber.   Try to increase your intake of fiber through using high-fiber foods rather than fiber pills or supplements that contain small amounts of fiber.   The goal is to change the types of food eaten. Do not supplement your present diet with high-fiber foods, but replace foods in your present diet.    INCLUDE A VARIETY OF FIBER SOURCES  Replace refined and processed grains with whole grains, canned fruits with fresh fruits, and incorporate other fiber sources. White rice, white breads, and most bakery goods contain little or no fiber.   Brown whole-grain rice, buckwheat oats, and many fruits and vegetables are all good sources of fiber. These include: broccoli, Brussels sprouts, cabbage, cauliflower, beets, sweet potatoes, white potatoes (skin on), carrots, tomatoes, eggplant, squash, berries, fresh fruits, and dried fruits.   Cereals appear to be the richest source of fiber. Cereal fiber is found in whole grains and bran. Bran is the fiber-rich outer coat of cereal grain, which is largely removed in refining. In whole-grain cereals, the bran remains. In breakfast cereals, the largest amount of fiber is found in those with "bran" in their names. The fiber content is sometimes indicated on the label.   You may need to include additional fruits and vegetables each day.   In baking, for 1 cup white flour, you may use the following substitutions:   1 cup whole-wheat flour minus  2 tablespoons.   1/2 cup white flour plus 1/2 cup whole-wheat flour.

## 2012-04-17 NOTE — Assessment & Plan Note (Signed)
SX FAIRLY WELL CONTROLLED.  CONTINUE NEXIUM. LOW FAT DIET OPV IN 4 MOS

## 2012-04-17 NOTE — Progress Notes (Signed)
Subjective:    Patient ID: Kelly Hebert, female    DOB: 1940/08/19, 72 y.o.   MRN: [id number]  PCP: PATTERSON  HPI FEELING BETTER. CAN HAVE TWINGE OF PAIN IN LUQ AFTER EATING/DRINKING & BENDING OVER. CAN HAVE A BURNING IN THE BACK OF HER THROAT: ? 1-2X/WEEK, BUT DOESN'T HAPPEN EVERY WEEK. NOW ABLE TO SWALLOW LARGER PILLS. SOLID FOOD GOES DOWN OK.BMs BETTER AFTER MLX TID BUT DOESN'T HAVE A BM UNTIL NEXT DAY. HAS A GOOD BM QOD, BUT BOWELS MOVE DAILY. CAN HAVE GAS/BLOATING-<1-2X/WEEK.  Past Medical History  Diagnosis Date  . Hypercholesteremia   . Hypertension   . Acid reflux   . Anxiety   . Depression   . COPD (chronic obstructive pulmonary disease)   . S/P colonoscopy 2003    Dr. Maryruth Bun: normal colon, normal path.   . Renal disorder     pelvic kidney  . Gout    Past Surgical History  Procedure Laterality Date  . Abdominal hysterectomy      complete  . Rotator cuff repair    . Laparoscopy  2000    -stomach pain-pt reports negative  . Colonoscopy  03/09/2011    ZHY:[id number] diverticula, small internal hemorrhoids.   . Esophagogastroduodenoscopy   03/09/2011    NGE:[id number] in the distal esophagus/ Stenosis at the pylorus/ Mild gastritis/ Hiatal hernia. gastric bx showed gastritis but no H.pylori  . Esophagogastroduodenoscopy  03/20/11    SLF: stricture distal esophgus and stenosis at pyloris likely from NSAIDs. s/p dialtion.   . 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 Esophagogastroduodenoscopy (egd) with esophageal dilation  12/03/2011    MWN:UUVOZ was found in the entire examined stomach/Stricture was found at the gastroesophageal junction   Allergies  Allergen  Reactions  . Ivp Dye (Iodinated Diagnostic Agents) Hives and Itching    Pt. States this happened 4 or 5 years ago in [place].      Current Outpatient Prescriptions  Medication Sig Dispense Refill  . acetaminophen (TYLENOL) 500 MG tablet Take 500 mg by mouth every 6 (six) hours as needed. Pain      . albuterol (PROVENTIL HFA;VENTOLIN HFA) 108 (90 BASE) MCG/ACT inhaler Inhale 2 puffs into the lungs every 6 (six) hours as needed. Shortness of breath       . amLODipine (NORVASC) 10 MG tablet Take 10 mg by mouth daily.      Marland Kitchen atorvastatin (LIPITOR) 40 MG tablet Take 40 mg by mouth daily.       . benazepril-hydrochlorthiazide (LOTENSIN HCT) 20-12.5 MG per tablet Take 1 tablet by mouth Daily.      . cetirizine (ZYRTEC) 5 MG tablet Take 5 mg by mouth daily.      . citalopram (CELEXA) 40 MG tablet Take 40 mg by mouth daily.        .      . esomeprazole (NEXIUM) 40 MG capsule 1 po 30 minutes prior to breakfast and supper    . fluticasone (FLONASE) 50 MCG/ACT nasal spray Place 2 sprays into the nose daily.        . fluticasone (FLOVENT HFA) 110 MCG/ACT inhaler Inhale 1 puff into the lungs 2 (two) times daily as needed. Shortness of breath.      Marland Kitchen  HYDROcodone-acetaminophen (NORCO) 5-325 MG per tablet Take 2 tablets by mouth every 4 (four) hours as needed. pain      . l-methylfolate-B6-B12 (METANX) 3-35-2 MG TABS Take 1 tablet by mouth daily.      Marland Kitchen levothyroxine (SYNTHROID, LEVOTHROID) 50 MCG tablet Take 50 mcg by mouth daily.       Marland Kitchen LORazepam (ATIVAN) 1 MG tablet Take 1 mg by mouth 2 (two) times daily.       Bertram Gala Glycol-Propyl Glycol (SYSTANE OP) Place 1 drop into both eyes 2 (two) times daily.      .        . Vitamin D, Ergocalciferol, (DRISDOL) 50000 UNITS CAPS Take 50,000 Units by mouth every 14 (fourteen) days. Patient takes twice a month      . loratadine (CLARITIN) 10 MG tablet Take 10 mg by mouth daily as needed. Sinuses      .           Review of Systems     Objective:    Physical Exam  Vitals reviewed. Constitutional: She is oriented to person, place, and time. She appears well-nourished. No distress.  HENT:  Head: Normocephalic and atraumatic.  Mouth/Throat: Oropharynx is clear and moist. No oropharyngeal exudate.  Eyes: Pupils are equal, round, and reactive to light. No scleral icterus.  Neck: Normal range of motion. Neck supple.  Cardiovascular: Normal rate, regular rhythm and normal heart sounds.   Pulmonary/Chest: Effort normal and breath sounds normal. No respiratory distress.  Abdominal: Soft. Bowel sounds are normal. She exhibits no distension. There is no tenderness.  Musculoskeletal: Normal range of motion. She exhibits no edema.  Neurological: She is alert and oriented to person, place, and time.  NO FOCAL DEFICITS   Psychiatric:  FLAT AFFECT, NL MOOD           Assessment & Plan:

## 2012-04-17 NOTE — Assessment & Plan Note (Signed)
RESOLVED.  MONITOR SYMPTOMS

## 2012-04-21 NOTE — Progress Notes (Signed)
CC PCP 

## 2012-04-29 NOTE — Progress Notes (Signed)
Reminder appt made 

## 2012-07-30 ENCOUNTER — Encounter: Payer: Self-pay | Admitting: Gastroenterology

## 2012-07-30 ENCOUNTER — Ambulatory Visit (INDEPENDENT_AMBULATORY_CARE_PROVIDER_SITE_OTHER): Payer: Medicare Other | Admitting: Gastroenterology

## 2012-07-30 VITALS — BP 130/74 | HR 85 | Temp 98.2°F | Ht 62.0 in | Wt 144.4 lb

## 2012-07-30 DIAGNOSIS — K5900 Constipation, unspecified: Secondary | ICD-10-CM

## 2012-07-30 DIAGNOSIS — K219 Gastro-esophageal reflux disease without esophagitis: Secondary | ICD-10-CM

## 2012-07-30 NOTE — Assessment & Plan Note (Signed)
SX CONTROLLED.  LOW FAT DIET CONTINUE NEXIUM. TAKE 30 MINUTES BEFORE MEALS. OPV IN 6 MOS.

## 2012-07-30 NOTE — Patient Instructions (Signed)
FOLLOW A HIGH FIBER/LOW FAT DIET. SEE INFO BELOW.  DRINK WATER TO KEEP YOUR URINE LIGHT YELLOW.  FOLLOW UP IN 6 MOS.    Low-Fat Diet BREADS, CEREALS, PASTA, RICE, DRIED PEAS, AND BEANS These products are high in carbohydrates and most are low in fat. Therefore, they can be increased in the diet as substitutes for fatty foods. They too, however, contain calories and should not be eaten in excess. Cereals can be eaten for snacks as well as for breakfast.   FRUITS AND VEGETABLES It is good to eat fruits and vegetables. Besides being sources of fiber, both are rich in vitamins and some minerals. They help you get the daily allowances of these nutrients. Fruits and vegetables can be used for snacks and desserts.  MEATS Limit lean meat, chicken, [place], and fish to no more than 6 ounces per day. Beef, Pork, and Lamb Use lean cuts of beef, pork, and lamb. Lean cuts include:  Extra-lean ground beef.  Arm roast.  Sirloin tip.  Center-cut ham.  Round steak.  Loin chops.  Rump roast.  Tenderloin.  Trim all fat off the outside of meats before cooking. It is not necessary to severely decrease the intake of red meat, but lean choices should be made. Lean meat is rich in protein and contains a highly absorbable form of iron. Premenopausal women, in particular, should avoid reducing lean red meat because this could increase the risk for low red blood cells (iron-deficiency anemia).  Chicken and [place] These are good sources of protein. The fat of poultry can be reduced by removing the skin and underlying fat layers before cooking. Chicken and [place] can be substituted for lean red meat in the diet. Poultry should not be fried or covered with high-fat sauces. Fish and Shellfish Fish is a good source of protein. Shellfish contain cholesterol, but they usually are low in saturated fatty acids. The preparation of fish is important. Like chicken and [place], they should not be fried or covered with high-fat  sauces. EGGS Egg whites contain no fat or cholesterol. They can be eaten often. Try 1 to 2 egg whites instead of whole eggs in recipes or use egg substitutes that do not contain yolk. MILK AND DAIRY PRODUCTS Use skim or 1% milk instead of 2% or whole milk. Decrease whole milk, natural, and processed cheeses. Use nonfat or low-fat (2%) cottage cheese or low-fat cheeses made from vegetable oils. Choose nonfat or low-fat (1 to 2%) yogurt. Experiment with evaporated skim milk in recipes that call for heavy cream. Substitute low-fat yogurt or low-fat cottage cheese for sour cream in dips and salad dressings. Have at least 2 servings of low-fat dairy products, such as 2 glasses of skim (or 1%) milk each day to help get your daily calcium intake. FATS AND OILS Reduce the total intake of fats, especially saturated fat. Butterfat, lard, and beef fats are high in saturated fat and cholesterol. These should be avoided as much as possible. Vegetable fats do not contain cholesterol, but certain vegetable fats, such as coconut oil, palm oil, and palm kernel oil are very high in saturated fats. These should be limited. These fats are often used in bakery goods, processed foods, popcorn, oils, and nondairy creamers. Vegetable shortenings and some peanut butters contain hydrogenated oils, which are also saturated fats. Read the labels on these foods and check for saturated vegetable oils. Unsaturated vegetable oils and fats do not raise blood cholesterol. However, they should be limited because they are fats  and are high in calories. Total fat should still be limited to 30% of your daily caloric intake. Desirable liquid vegetable oils are corn oil, cottonseed oil, olive oil, canola oil, safflower oil, soybean oil, and sunflower oil. Peanut oil is not as good, but small amounts are acceptable. Buy a heart-healthy tub margarine that has no partially hydrogenated oils in the ingredients. Mayonnaise and salad dressings often are  made from unsaturated fats, but they should also be limited because of their high calorie and fat content. Seeds, nuts, peanut butter, olives, and avocados are high in fat, but the fat is mainly the unsaturated type. These foods should be limited mainly to avoid excess calories and fat. OTHER EATING TIPS Snacks  Most sweets should be limited as snacks. They tend to be rich in calories and fats, and their caloric content outweighs their nutritional value. Some good choices in snacks are graham crackers, melba toast, soda crackers, bagels (no egg), English muffins, fruits, and vegetables. These snacks are preferable to snack crackers, [place] fries, TORTILLA CHIPS, and POTATO chips. Popcorn should be air-popped or cooked in small amounts of liquid vegetable oil. Desserts Eat fruit, low-fat yogurt, and fruit ices instead of pastries, cake, and cookies. Sherbet, angel food cake, gelatin dessert, frozen low-fat yogurt, or other frozen products that do not contain saturated fat (pure fruit juice bars, frozen ice pops) are also acceptable.  COOKING METHODS Choose those methods that use little or no fat. They include: Poaching.  Braising.  Steaming.  Grilling.  Baking.  Stir-frying.  Broiling.  Microwaving.  Foods can be cooked in a nonstick pan without added fat, or use a nonfat cooking spray in regular cookware. Limit fried foods and avoid frying in saturated fat. Add moisture to lean meats by using water, broth, cooking wines, and other nonfat or low-fat sauces along with the cooking methods mentioned above. Soups and stews should be chilled after cooking. The fat that forms on top after a few hours in the refrigerator should be skimmed off. When preparing meals, avoid using excess salt. Salt can contribute to raising blood pressure in some people.  EATING AWAY FROM HOME Order entres, potatoes, and vegetables without sauces or butter. When meat exceeds the size of a deck of cards (3 to 4 ounces), the  rest can be taken home for another meal. Choose vegetable or fruit salads and ask for low-calorie salad dressings to be served on the side. Use dressings sparingly. Limit high-fat toppings, such as bacon, crumbled eggs, cheese, sunflower seeds, and olives. Ask for heart-healthy tub margarine instead of butter.  High-Fiber Diet A high-fiber diet changes your normal diet to include more whole grains, legumes, fruits, and vegetables. Changes in the diet involve replacing refined carbohydrates with unrefined foods. The calorie level of the diet is essentially unchanged. The Dietary Reference Intake (recommended amount) for adult males is 38 grams per day. For adult females, it is 25 grams per day. Pregnant and lactating women should consume 28 grams of fiber per day. Fiber is the intact part of a plant that is not broken down during digestion. Functional fiber is fiber that has been isolated from the plant to provide a beneficial effect in the body. PURPOSE  Increase stool bulk.   Ease and regulate bowel movements.   Lower cholesterol.  INDICATIONS THAT YOU NEED MORE FIBER  Constipation and hemorrhoids.   Uncomplicated diverticulosis (intestine condition) and irritable bowel syndrome.   Weight management.   As a protective measure against hardening of  the arteries (atherosclerosis), diabetes, and cancer.   GUIDELINES FOR INCREASING FIBER IN THE DIET  Start adding fiber to the diet slowly. A gradual increase of about 5 more grams (2 slices of whole-wheat bread, 2 servings of most fruits or vegetables, or 1 bowl of high-fiber cereal) per day is best. Too rapid an increase in fiber may result in constipation, flatulence, and bloating.   Drink enough water and fluids to keep your urine clear or pale yellow. Water, juice, or caffeine-free drinks are recommended. Not drinking enough fluid may cause constipation.   Eat a variety of high-fiber foods rather than one type of fiber.   Try to increase  your intake of fiber through using high-fiber foods rather than fiber pills or supplements that contain small amounts of fiber.   The goal is to change the types of food eaten. Do not supplement your present diet with high-fiber foods, but replace foods in your present diet.  INCLUDE A VARIETY OF FIBER SOURCES  Replace refined and processed grains with whole grains, canned fruits with fresh fruits, and incorporate other fiber sources. White rice, white breads, and most bakery goods contain little or no fiber.   Brown whole-grain rice, buckwheat oats, and many fruits and vegetables are all good sources of fiber. These include: broccoli, Brussels sprouts, cabbage, cauliflower, beets, sweet potatoes, white potatoes (skin on), carrots, tomatoes, eggplant, squash, berries, fresh fruits, and dried fruits.   Cereals appear to be the richest source of fiber. Cereal fiber is found in whole grains and bran. Bran is the fiber-rich outer coat of cereal grain, which is largely removed in refining. In whole-grain cereals, the bran remains. In breakfast cereals, the largest amount of fiber is found in those with "bran" in their names. The fiber content is sometimes indicated on the label.   You may need to include additional fruits and vegetables each day.   In baking, for 1 cup white flour, you may use the following substitutions:   1 cup whole-wheat flour minus 2 tablespoons.   1/2 cup white flour plus 1/2 cup whole-wheat flour.

## 2012-07-30 NOTE — Assessment & Plan Note (Signed)
SX IMPROVED.  DRINK WATER  HIGH FIBER DIET  MIRALAX AS NEEDED FOR A GOOD BM  OPV IN 6 MOS

## 2012-07-30 NOTE — Progress Notes (Signed)
Cc PCP 

## 2012-07-30 NOTE — Progress Notes (Signed)
Reminder in epic °

## 2012-07-30 NOTE — Progress Notes (Signed)
Subjective:    Patient ID: Kelly Hebert, female    DOB: [birth year], 72 y.o.   MRN: [id number]  Kelly Downer, MD  HPI DOING WELL EXCEPT HAD A GRIPING IN HER LOWER ABDOMEN FOR 7-10 DAYS. THOUGHT IT WAS A VIRUS. NO DIARRHEA. AT NIGHT TIME EATS FRUIT AND FIRST THING IN THE AM SHE HAS A BM. BOWEL ARE BETTER AFTER DRINKING MORE WATER AND EATING FIBER. NO ADDITIONAL BLOATING/GAS. CAN HAVE EARLY AM NAUSEA: 1-2X/WEEK. SWALLOWING IS OK. HAS A SCRATCHY THROAT AND SAW PCP LAST WEEK. NOT TAKING LINZESS.  PT DENIES BRBPR, vomiting, melena, diarrhea, constipation, problems swallowing. RARE heartburn or indigestion: < 1-2X/MO.  Past Medical History  Diagnosis Date  . Hypercholesteremia   . Hypertension   . Acid reflux   . Anxiety   . Depression   . COPD (chronic obstructive pulmonary disease)   . S/P colonoscopy 2003    Dr. Maryruth Bun: normal colon, normal path.   . Renal disorder     pelvic kidney  . Gout    Past Surgical History  Procedure Laterality Date  . Abdominal hysterectomy      complete  . Rotator cuff repair    . Laparoscopy  2000    [place]-stomach pain-pt reports negative  . Colonoscopy  03/09/2011    WUJ:[id number] diverticula, small internal hemorrhoids.   . Esophagogastroduodenoscopy   03/09/2011    [id number]:[id number] in the distal esophagus/ Stenosis at the pylorus/ Mild gastritis/ Hiatal hernia. gastric bx showed gastritis but no H.pylori  . Esophagogastroduodenoscopy  03/20/11    SLF: stricture distal esophgus and stenosis at pyloris likely from NSAIDs. s/p dialtion.   . 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 Esophagogastroduodenoscopy (egd) with esophageal dilation   12/03/2011    HQI:ONGEX was found in the entire examined stomach/Stricture was found at the gastroesophageal junction   Allergies  Allergen Reactions  . Ivp Dye (Iodinated Diagnostic Agents) Hives and Itching    Pt. States this happened 4 or 5 years ago in [place].      Current Outpatient Prescriptions  Medication Sig Dispense Refill  . acetaminophen (TYLENOL) 500 MG tablet Take 500 mg by mouth every 6 (six) hours as needed. Pain      . albuterol (PROVENTIL HFA;VENTOLIN HFA) 108 (90 BASE) MCG/ACT inhaler Inhale 2 puffs into the lungs every 6 (six) hours as needed. Shortness of breath       . amitriptyline (ELAVIL) 10 MG tablet Take 10 mg by mouth at bedtime.       Marland Kitchen amLODipine (NORVASC) 10 MG tablet Take 10 mg by mouth daily.      Marland Kitchen atorvastatin (LIPITOR) 40 MG tablet Take 40 mg by mouth daily.       . benazepril-hydrochlorthiazide (LOTENSIN HCT) 20-12.5 MG per tablet Take 1 tablet by mouth Daily.      . cetirizine (ZYRTEC) 5 MG tablet Take 5 mg by mouth daily.      . citalopram (CELEXA) 40 MG tablet Take 40 mg by mouth daily.        .      . esomeprazole (NEXIUM) 40 MG capsule 1 po 30 minutes prior to breakfast and supper    . fluticasone (FLONASE) 50 MCG/ACT  nasal spray Place 2 sprays into the nose daily.        . fluticasone (FLOVENT HFA) 110 MCG/ACT inhaler Inhale 1 puff into the lungs 2 (two) times daily as needed. Shortness of breath.      Marland Kitchen l-methylfolate-B6-B12 (METANX) 3-35-2 MG TABS Take 1 tablet by mouth daily.      Marland Kitchen levothyroxine (SYNTHROID, LEVOTHROID) 50 MCG tablet Take 50 mcg by mouth daily.       Bertram Gala Glycol-Propyl Glycol (SYSTANE OP) Place 1 drop into both eyes 2 (two) times daily.      . Vitamin D, Ergocalciferol, (DRISDOL) 50000 UNITS CAPS Take 50,000 Units by mouth every 14 (fourteen) days. Patient takes twice a month      . HYDROcodone-acetaminophen (NORCO) 5-325 MG per tablet Take 2 tablets by mouth every 4 (four) hours as needed. pain      . loratadine  (CLARITIN) 10 MG tablet Take 10 mg by mouth daily as needed. Sinuses      . LORazepam (ATIVAN) 1 MG tablet Take 1 mg by mouth 2 (two) times daily.       .      .            Review of Systems     Objective:   Physical Exam  Vitals reviewed. Constitutional: She is oriented to person, place, and time. She appears well-nourished. No distress.  HENT:  Head: Normocephalic and atraumatic.  Mouth/Throat: Oropharynx is clear and moist. No oropharyngeal exudate.  Eyes: Pupils are equal, round, and reactive to light. No scleral icterus.  Neck: Normal range of motion. Neck supple.  Cardiovascular: Normal rate, regular rhythm and normal heart sounds.   Pulmonary/Chest: Effort normal and breath sounds normal. No respiratory distress.  Abdominal: Soft. Bowel sounds are normal. She exhibits no distension. There is no tenderness.  Musculoskeletal: She exhibits no edema.  Lymphadenopathy:    She has no cervical adenopathy.  Neurological: She is alert and oriented to person, place, and time.  NO FOCAL DEFICITS   Psychiatric: She has a normal mood and affect.          Assessment & Plan:

## 2012-10-01 ENCOUNTER — Encounter (HOSPITAL_COMMUNITY): Payer: Self-pay

## 2012-10-01 ENCOUNTER — Emergency Department (HOSPITAL_COMMUNITY)
Admission: EM | Admit: 2012-10-01 | Discharge: 2012-10-01 | Disposition: A | Discharge status: Home / Self Care | Payer: Medicare Other | Attending: Emergency Medicine | Admitting: Emergency Medicine

## 2012-10-01 DIAGNOSIS — J449 Chronic obstructive pulmonary disease, unspecified: Secondary | ICD-10-CM | POA: Insufficient documentation

## 2012-10-01 DIAGNOSIS — I10 Essential (primary) hypertension: Secondary | ICD-10-CM | POA: Insufficient documentation

## 2012-10-01 DIAGNOSIS — F430 Acute stress reaction: Secondary | ICD-10-CM | POA: Insufficient documentation

## 2012-10-01 DIAGNOSIS — Z87448 Personal history of other diseases of urinary system: Secondary | ICD-10-CM | POA: Insufficient documentation

## 2012-10-01 DIAGNOSIS — Z862 Personal history of diseases of the blood and blood-forming organs and certain disorders involving the immune mechanism: Secondary | ICD-10-CM | POA: Insufficient documentation

## 2012-10-01 DIAGNOSIS — J4489 Other specified chronic obstructive pulmonary disease: Secondary | ICD-10-CM | POA: Insufficient documentation

## 2012-10-01 DIAGNOSIS — R197 Diarrhea, unspecified: Secondary | ICD-10-CM

## 2012-10-01 DIAGNOSIS — R5381 Other malaise: Secondary | ICD-10-CM | POA: Insufficient documentation

## 2012-10-01 DIAGNOSIS — IMO0002 Reserved for concepts with insufficient information to code with codable children: Secondary | ICD-10-CM | POA: Insufficient documentation

## 2012-10-01 DIAGNOSIS — Z79899 Other long term (current) drug therapy: Secondary | ICD-10-CM | POA: Insufficient documentation

## 2012-10-01 DIAGNOSIS — R109 Unspecified abdominal pain: Secondary | ICD-10-CM | POA: Insufficient documentation

## 2012-10-01 DIAGNOSIS — F3289 Other specified depressive episodes: Secondary | ICD-10-CM | POA: Insufficient documentation

## 2012-10-01 DIAGNOSIS — R51 Headache: Secondary | ICD-10-CM | POA: Insufficient documentation

## 2012-10-01 DIAGNOSIS — E876 Hypokalemia: Secondary | ICD-10-CM

## 2012-10-01 DIAGNOSIS — F411 Generalized anxiety disorder: Secondary | ICD-10-CM | POA: Insufficient documentation

## 2012-10-01 DIAGNOSIS — Z90710 Acquired absence of both cervix and uterus: Secondary | ICD-10-CM | POA: Insufficient documentation

## 2012-10-01 DIAGNOSIS — E860 Dehydration: Secondary | ICD-10-CM

## 2012-10-01 DIAGNOSIS — F329 Major depressive disorder, single episode, unspecified: Secondary | ICD-10-CM | POA: Insufficient documentation

## 2012-10-01 DIAGNOSIS — Z8639 Personal history of other endocrine, nutritional and metabolic disease: Secondary | ICD-10-CM | POA: Insufficient documentation

## 2012-10-01 DIAGNOSIS — Z9889 Other specified postprocedural states: Secondary | ICD-10-CM | POA: Insufficient documentation

## 2012-10-01 DIAGNOSIS — E7800 Pure hypercholesterolemia, unspecified: Secondary | ICD-10-CM | POA: Insufficient documentation

## 2012-10-01 LAB — CBC WITH DIFFERENTIAL/PLATELET
Basophils Relative: 1 % (ref 0–1)
HCT: 35.5 % — ABNORMAL LOW (ref 36.0–46.0)
Hemoglobin: 12.1 g/dL (ref 12.0–15.0)
MCH: 31.1 pg (ref 26.0–34.0)
MCHC: 34.1 g/dL (ref 30.0–36.0)
MCV: 91.3 fL (ref 78.0–100.0)
Monocytes Absolute: 0.9 10*3/uL (ref 0.1–1.0)
Monocytes Relative: 11 % (ref 3–12)
Neutro Abs: 5 10*3/uL (ref 1.7–7.7)

## 2012-10-01 LAB — BASIC METABOLIC PANEL
BUN: 12 mg/dL (ref 6–23)
Chloride: 102 mEq/L (ref 96–112)
Creatinine, Ser: 0.99 mg/dL (ref 0.50–1.10)
GFR calc Af Amer: 64 mL/min — ABNORMAL LOW (ref >90)

## 2012-10-01 MED ORDER — POTASSIUM CHLORIDE CRYS ER 20 MEQ PO TBCR: 20.0000 meq | EXTENDED_RELEASE_TABLET | Freq: Two times a day (BID) | ORAL | Status: DC

## 2012-10-01 MED ORDER — POTASSIUM CHLORIDE CRYS ER 20 MEQ PO TBCR
40.0000 meq | EXTENDED_RELEASE_TABLET | Freq: Once | ORAL | Status: AC
  Administered 2012-10-01: 40 meq via ORAL
  Filled 2012-10-01: qty 2

## 2012-10-01 MED ORDER — SODIUM CHLORIDE 0.9 % IV SOLN
INTRAVENOUS | Status: DC
  Administered 2012-10-01: 12:00:00 via INTRAVENOUS

## 2012-10-01 MED ORDER — SODIUM CHLORIDE 0.9 % IV BOLUS (SEPSIS)
700.0000 mL | Freq: Once | INTRAVENOUS | Status: AC
  Administered 2012-10-01: 700 mL via INTRAVENOUS

## 2012-10-01 NOTE — ED Provider Notes (Signed)
CSN: [id number]     Arrival date & time 10/01/12  2956 History  This chart was scribed for Ward Givens, MD by Quintella Reichert, ED scribe.  This patient was seen in room APA07/APA07 and the patient's care was started at 10:12 AM.  Chief Complaint  Patient presents with  . Diarrhea    The history is provided by the patient. No language interpreter was used.    HPI Comments: Kelly Hebert is a 72 y.o. female with h/o GERD, HTN, and hypercholesteremia who presents to the Emergency Department complaining of moderate diarrhea that began 5 days ago with associated abdominal discomfort that started today and generalized weakness.  Pt states that for 4 days she had 6-7 daily episodes of "thin," "dark green" stools with foul odor, and has occasionally been waking up with diarrhea in her bed.  However she states that yesterday she did not move her bowels at all.  Last night she again woke up in the middle of the night having diarrhea.  Pt notes that diarrhea mainly occurs after eating or drinking.  She also notes that she hears her stomach is gurgling and states "it isn't hurting" but occasionally she has some "pinching pain" in her lower abdomen.  She also states that she feels weak.  She is able to walk but states she becomes tired easily.  She had a transient headache 3 days ago.  She denies fever, dizziness, nausea, or vomiting.  She has not been on antibiotics recently.  Pt notes that her friend also had diarrhea 2 weeks ago.  She denies suspect food intake.  She states she does not have diarrhea regularly.  Pt does not smoke or drink.  She lives alone "most of the time but not all of the time."  GI is Dr. Darrick Penna PCP is Dr. Deatra Robinson   Past Medical History  Diagnosis Date  . Hypercholesteremia   . Hypertension   . Acid reflux   . Anxiety   . Depression   . COPD (chronic obstructive pulmonary disease)   . S/P colonoscopy 2003    Dr. Maryruth Bun: normal colon, normal path.   . Renal disorder      pelvic kidney  . Gout     Past Surgical History  Procedure Laterality Date  . Abdominal hysterectomy      complete  . Rotator cuff repair    . Laparoscopy  2000    [place]-stomach pain-pt reports negative  . Colonoscopy  03/09/2011    OZH:[id number] diverticula, small internal hemorrhoids.   . Esophagogastroduodenoscopy   03/09/2011    ONG:[id number] in the distal esophagus/ Stenosis at the pylorus/ Mild gastritis/ Hiatal hernia. gastric bx showed gastritis but no H.pylori  . Esophagogastroduodenoscopy  03/20/11    SLF: stricture distal esophgus and stenosis at pyloris likely from NSAIDs. s/p dialtion.   . 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 Esophagogastroduodenoscopy (egd) with esophageal dilation  12/03/2011    GMW:NUUVO was found in the entire examined stomach/Stricture was found at the gastroesophageal junction    Family History  Problem Relation Age of Onset  . Kidney disease Sister   . Cirrhosis Sister     Non-etoh  . Cirrhosis Brother     ?  etoh  . Colon cancer Neg Hx     History  Substance Use Topics  . Smoking status: Never Smoker   . Smokeless tobacco: Not on file  . Alcohol Use: No  lives at home Lives alone  OB History   Grav Para Term Preterm Abortions TAB SAB Ect Mult Living                  Review of Systems  Constitutional: Negative for fever.  Gastrointestinal: Positive for abdominal pain and diarrhea. Negative for nausea and vomiting.  Neurological: Positive for weakness (generalized) and headaches. Negative for dizziness.  All other systems reviewed and are negative.     Allergies  Ivp dye  Home Medications   Current Outpatient Rx  Name   Route  Sig  Dispense  Refill  . acetaminophen (TYLENOL) 500 MG tablet   Oral   Take 500 mg by mouth every 6 (six) hours as needed. Pain         . albuterol (PROVENTIL HFA;VENTOLIN HFA) 108 (90 BASE) MCG/ACT inhaler   Inhalation   Inhale 2 puffs into the lungs every 6 (six) hours as needed. Shortness of breath          . amitriptyline (ELAVIL) 10 MG tablet   Oral   Take 10 mg by mouth at bedtime.          Marland Kitchen amLODipine (NORVASC) 10 MG tablet   Oral   Take 10 mg by mouth daily.         Marland Kitchen atorvastatin (LIPITOR) 40 MG tablet   Oral   Take 40 mg by mouth daily.          . benazepril-hydrochlorthiazide (LOTENSIN HCT) 20-12.5 MG per tablet   Oral   Take 1 tablet by mouth Daily.         . cetirizine (ZYRTEC) 5 MG tablet   Oral   Take 5 mg by mouth daily.         . citalopram (CELEXA) 40 MG tablet   Oral   Take 40 mg by mouth daily.           Marland Kitchen esomeprazole (NEXIUM) 40 MG capsule      1 po 30 minutes prior to breakfast and supper   62 capsule   11   . fluticasone (FLONASE) 50 MCG/ACT nasal spray   Nasal   Place 2 sprays into the nose daily.           . fluticasone (FLOVENT HFA) 110 MCG/ACT inhaler   Inhalation   Inhale 1 puff into the lungs 2 (two) times daily as needed. Shortness of breath.         Marland Kitchen l-methylfolate-B6-B12 (METANX) 3-35-2 MG TABS   Oral   Take 1 tablet by mouth daily.         Marland Kitchen levothyroxine (SYNTHROID, LEVOTHROID) 50 MCG tablet   Oral   Take 50 mcg by mouth daily.          Marland Kitchen LORazepam (ATIVAN) 1 MG tablet   Oral   Take 1 mg by mouth 2 (two) times daily.          Bertram Gala Glycol-Propyl Glycol (SYSTANE OP)   Both Eyes   Place 1 drop into both eyes 2 (two) times daily.         . Vitamin D, Ergocalciferol, (DRISDOL) 50000 UNITS CAPS   Oral   Take 50,000 Units by mouth every 14 (fourteen) days. Patient takes twice a month  BP 134/70  Pulse 86  Temp(Src) 98.6 F (37 C) (Oral)  Resp 18  Ht 5\' 3"  (1.6  m)  Wt 147 lb (66.679 kg)  BMI 26.05 kg/m2  SpO2 99%  Vital signs normal    Physical Exam  Nursing note and vitals reviewed. Constitutional: She is oriented to person, place, and time. She appears well-developed and well-nourished.  Non-toxic appearance. She does not appear ill. No distress.  HENT:  Head: Normocephalic and atraumatic.  Right Ear: External ear normal.  Left Ear: External ear normal.  Nose: Nose normal. No mucosal edema or rhinorrhea.  Mouth/Throat: Mucous membranes are dry. No dental abscesses or edematous.  Tongue dry  Eyes: Conjunctivae and EOM are normal. Pupils are equal, round, and reactive to light.  Neck: Normal range of motion and full passive range of motion without pain. Neck supple.  Cardiovascular: Normal rate, regular rhythm and normal heart sounds.  Exam reveals no gallop and no friction rub.   No murmur heard. Pulmonary/Chest: Effort normal and breath sounds normal. No respiratory distress. She has no wheezes. She has no rhonchi. She has no rales. She exhibits no tenderness and no crepitus.  Abdominal: Soft. Normal appearance and bowel sounds are normal. She exhibits no distension. There is no tenderness. There is no rebound and no guarding.  Musculoskeletal: Normal range of motion. She exhibits no edema and no tenderness.  Moves all extremities well.   Neurological: She is alert and oriented to person, place, and time. She has normal strength. No cranial nerve deficit.  Skin: Skin is warm, dry and intact. No rash noted. No erythema. No pallor.  Psychiatric: She has a normal mood and affect. Her speech is normal and behavior is normal. Her mood appears not anxious.    ED Course  Procedures (including critical care time)  Medications  0.9 %  sodium chloride infusion ( Intravenous Stopped 10/01/12 1344)  sodium chloride 0.9 % bolus 700 mL (0 mLs Intravenous Stopped 10/01/12 1150)  potassium chloride SA (K-DUR,KLOR-CON) CR tablet 40 mEq (40 mEq Oral  Given 10/01/12 1115)     DIAGNOSTIC STUDIES: Oxygen Saturation is 99% on room air, normal by my interpretation.    COORDINATION OF CARE: 10:21 AM-Discussed treatment plan which includes IV fluids, labs and stool sample with pt at bedside and pt agreed to plan.   Patient given oral potassium for her hypokalemia most likely from her diarrhea.  Pt is having urine output, denies any diarrhea while in the ED despite drinking fluids.   Results for orders placed during the hospital encounter of 10/01/12  CBC WITH DIFFERENTIAL      Result Value Range   WBC 8.1  4.0 - 10.5 K/uL   RBC 3.89  3.87 - 5.11 MIL/uL   Hemoglobin 12.1  12.0 - 15.0 g/dL   HCT 16.1 (*) 09.6 - 04.5 %   MCV 91.3  78.0 - 100.0 fL   MCH 31.1  26.0 - 34.0 pg   MCHC 34.1  30.0 - 36.0 g/dL   RDW 40.9  81.1 - 91.4 %   Platelets 205  150 - 400 K/uL   Neutrophils Relative % 62  43 - 77 %   Neutro Abs 5.0  1.7 - 7.7 K/uL   Lymphocytes Relative 25  12 - 46 %   Lymphs Abs 2.0  0.7 - 4.0 K/uL   Monocytes Relative 11  3 - 12 %   Monocytes Absolute 0.9  0.1 - 1.0 K/uL   Eosinophils Relative  2  0 - 5 %   Eosinophils Absolute 0.2  0.0 - 0.7 K/uL   Basophils Relative 1  0 - 1 %   Basophils Absolute 0.0  0.0 - 0.1 K/uL  BASIC METABOLIC PANEL      Result Value Range   Sodium 141  135 - 145 mEq/L   Potassium 3.2 (*) 3.5 - 5.1 mEq/L   Chloride 102  96 - 112 mEq/L   CO2 31  19 - 32 mEq/L   Glucose, Bld 91  70 - 99 mg/dL   BUN 12  6 - 23 mg/dL   Creatinine, Ser 9.60  0.50 - 1.10 mg/dL   Calcium 9.4  8.4 - 45.4 mg/dL   GFR calc non Af Amer 55 (*) >90 mL/min   GFR calc Af Amer 64 (*) >90 mL/min   Laboratory interpretation all normal except hypokalemia   MDM  patient with diarrhea with no precipitating factors such as recent antibiotic use suspect food exposure. She did have a friend of diarrhea however it was over a week ago. Patient had no diarrhea while in the ED. She is ambulatory and urinating after getting IV fluid  hydration. She is stable to go home.    1. Diarrhea   2. Dehydration   3. Hypokalemia    New Prescriptions   POTASSIUM CHLORIDE SA (K-DUR,KLOR-CON) 20 MEQ TABLET    Take 1 tablet (20 mEq total) by mouth 2 (two) times daily.    Plan discharge   I personally performed the services described in this documentation, which was scribed in my presence. The recorded information has been reviewed and considered.  Devoria Albe, MD, Armando Gang    Ward Givens, MD 10/01/12 1346

## 2012-10-01 NOTE — ED Notes (Signed)
Patient with no complaints at this time. Respirations even and unlabored. Skin warm/dry. Discharge instructions reviewed with patient at this time. Patient given opportunity to voice concerns/ask questions. IV removed per policy and band-aid applied to site. Patient discharged at this time and left Emergency Department with steady gait.  

## 2012-10-01 NOTE — ED Notes (Signed)
Pt reports loose stools since Friday, no nausea, no vomiting, no fever. Pt had no stools yesterday, during the night she had 4 bm's.  Feels like her stomach is churning,

## 2013-02-12 ENCOUNTER — Encounter (HOSPITAL_COMMUNITY): Payer: Self-pay | Admitting: Emergency Medicine

## 2013-02-12 ENCOUNTER — Emergency Department (HOSPITAL_COMMUNITY): Payer: Medicare Other

## 2013-02-12 ENCOUNTER — Emergency Department (HOSPITAL_COMMUNITY)
Admission: EM | Admit: 2013-02-12 | Discharge: 2013-02-12 | Disposition: A | Discharge status: Home / Self Care | Payer: Medicare Other | Attending: Emergency Medicine | Admitting: Emergency Medicine

## 2013-02-12 DIAGNOSIS — K5900 Constipation, unspecified: Secondary | ICD-10-CM | POA: Insufficient documentation

## 2013-02-12 DIAGNOSIS — J4489 Other specified chronic obstructive pulmonary disease: Secondary | ICD-10-CM | POA: Insufficient documentation

## 2013-02-12 DIAGNOSIS — K219 Gastro-esophageal reflux disease without esophagitis: Secondary | ICD-10-CM | POA: Insufficient documentation

## 2013-02-12 DIAGNOSIS — Z791 Long term (current) use of non-steroidal anti-inflammatories (NSAID): Secondary | ICD-10-CM | POA: Insufficient documentation

## 2013-02-12 DIAGNOSIS — IMO0002 Reserved for concepts with insufficient information to code with codable children: Secondary | ICD-10-CM | POA: Insufficient documentation

## 2013-02-12 DIAGNOSIS — E7800 Pure hypercholesterolemia, unspecified: Secondary | ICD-10-CM | POA: Insufficient documentation

## 2013-02-12 DIAGNOSIS — I10 Essential (primary) hypertension: Secondary | ICD-10-CM | POA: Insufficient documentation

## 2013-02-12 DIAGNOSIS — F329 Major depressive disorder, single episode, unspecified: Secondary | ICD-10-CM | POA: Insufficient documentation

## 2013-02-12 DIAGNOSIS — Z79899 Other long term (current) drug therapy: Secondary | ICD-10-CM | POA: Insufficient documentation

## 2013-02-12 DIAGNOSIS — N390 Urinary tract infection, site not specified: Secondary | ICD-10-CM | POA: Insufficient documentation

## 2013-02-12 DIAGNOSIS — J449 Chronic obstructive pulmonary disease, unspecified: Secondary | ICD-10-CM | POA: Insufficient documentation

## 2013-02-12 DIAGNOSIS — F411 Generalized anxiety disorder: Secondary | ICD-10-CM | POA: Insufficient documentation

## 2013-02-12 DIAGNOSIS — Z90710 Acquired absence of both cervix and uterus: Secondary | ICD-10-CM | POA: Insufficient documentation

## 2013-02-12 DIAGNOSIS — F3289 Other specified depressive episodes: Secondary | ICD-10-CM | POA: Insufficient documentation

## 2013-02-12 DIAGNOSIS — Z9889 Other specified postprocedural states: Secondary | ICD-10-CM | POA: Insufficient documentation

## 2013-02-12 HISTORY — DX: Constipation, unspecified: K59.00

## 2013-02-12 LAB — URINALYSIS, ROUTINE W REFLEX MICROSCOPIC
Bilirubin Urine: NEGATIVE
Glucose, UA: NEGATIVE mg/dL
HGB URINE DIPSTICK: NEGATIVE
Ketones, ur: NEGATIVE mg/dL
Nitrite: NEGATIVE
SPECIFIC GRAVITY, URINE: 1.025 (ref 1.005–1.030)
UROBILINOGEN UA: 1 mg/dL (ref 0.0–1.0)
pH: 6 (ref 5.0–8.0)

## 2013-02-12 LAB — URINE MICROSCOPIC-ADD ON

## 2013-02-12 LAB — CBC WITH DIFFERENTIAL/PLATELET
BASOS PCT: 0 % (ref 0–1)
Basophils Absolute: 0 10*3/uL (ref 0.0–0.1)
Eosinophils Absolute: 0.2 10*3/uL (ref 0.0–0.7)
Eosinophils Relative: 2 % (ref 0–5)
HEMATOCRIT: 39.6 % (ref 36.0–46.0)
HEMOGLOBIN: 13.2 g/dL (ref 12.0–15.0)
LYMPHS ABS: 2.4 10*3/uL (ref 0.7–4.0)
LYMPHS PCT: 29 % (ref 12–46)
MCH: 30.8 pg (ref 26.0–34.0)
MCHC: 33.3 g/dL (ref 30.0–36.0)
MCV: 92.3 fL (ref 78.0–100.0)
MONO ABS: 0.9 10*3/uL (ref 0.1–1.0)
MONOS PCT: 11 % (ref 3–12)
NEUTROS ABS: 4.6 10*3/uL (ref 1.7–7.7)
NEUTROS PCT: 57 % (ref 43–77)
Platelets: 227 10*3/uL (ref 150–400)
RBC: 4.29 MIL/uL (ref 3.87–5.11)
RDW: 12.8 % (ref 11.5–15.5)
WBC: 8.1 10*3/uL (ref 4.0–10.5)

## 2013-02-12 LAB — BASIC METABOLIC PANEL
BUN: 11 mg/dL (ref 6–23)
CHLORIDE: 99 meq/L (ref 96–112)
CO2: 29 meq/L (ref 19–32)
Calcium: 10 mg/dL (ref 8.4–10.5)
Creatinine, Ser: 1.01 mg/dL (ref 0.50–1.10)
GFR calc non Af Amer: 54 mL/min — ABNORMAL LOW (ref >90)
GFR, EST AFRICAN AMERICAN: 62 mL/min — AB (ref >90)
GLUCOSE: 106 mg/dL — AB (ref 70–99)
POTASSIUM: 4.1 meq/L (ref 3.7–5.3)
Sodium: 139 mEq/L (ref 137–147)

## 2013-02-12 MED ORDER — CEPHALEXIN 500 MG PO CAPS: 500.0000 mg | ORAL_CAPSULE | Freq: Four times a day (QID) | ORAL | Status: DC

## 2013-02-12 MED ORDER — NAPROXEN 375 MG PO TABS: 375.0000 mg | ORAL_TABLET | Freq: Two times a day (BID) | ORAL | Status: DC

## 2013-02-12 NOTE — ED Notes (Signed)
Pt c/o lower abd cramping since Saturday.  Denies n/v/d.  LBM was yesterday.

## 2013-02-12 NOTE — Discharge Instructions (Signed)
Constipation, Adult Constipation is when a person:  Poops (bowel movement) less than 3 times a week.  Has a hard time pooping.  Has poop that is dry, hard, or bigger than normal. HOME CARE   Eat more fiber, such as fruits, vegetables, whole grains like brown rice, and beans.  Eat less fatty foods and sugar. This includes [place] fries, hamburgers, cookies, candy, and soda.  If you are not getting enough fiber from food, take products with added fiber in them (supplements).  Drink enough fluid to keep your pee (urine) clear or pale yellow.  Go to the restroom when you feel like you need to poop. Do not hold it.  Only take medicine as told by your doctor. Do not take medicines that help you poop (laxatives) without talking to your doctor first.  Exercise on a regular basis, or as told by your doctor. GET HELP RIGHT AWAY IF:   You have bright red blood in your poop (stool).  Your constipation lasts more than 4 days or gets worse.  You have belly (abdomen) or butt (rectal) pain.  You have thin poop (as thin as a pencil).  You lose weight, and it cannot be explained. MAKE SURE YOU:   Understand these instructions.  Will watch your condition.  Will get help right away if you are not doing well or get worse. Document Released: 06/27/2007 Document Revised: 04/02/2011 Document Reviewed: 10/20/2012 [facility] Patient Information 2014 [place], [place].  Urinary Tract Infection A urinary tract infection (UTI) can occur any place along the urinary tract. The tract includes the kidneys, ureters, bladder, and urethra. A type of germ called bacteria often causes a UTI. UTIs are often helped with antibiotic medicine.  HOME CARE   If given, take antibiotics as told by your doctor. Finish them even if you start to feel better.  Drink enough fluids to keep your pee (urine) clear or pale yellow.  Avoid tea, drinks with caffeine, and bubbly (carbonated) drinks.  Pee often. Avoid holding  your pee in for a long time.  Pee before and after having sex (intercourse).  Wipe from front to back after you poop (bowel movement) if you are a woman. Use each tissue only once. GET HELP RIGHT AWAY IF:   You have back pain.  You have lower belly (abdominal) pain.  You have chills.  You feel sick to your stomach (nauseous).  You throw up (vomit).  Your burning or discomfort with peeing does not go away.  You have a fever.  Your symptoms are not better in 3 days. MAKE SURE YOU:   Understand these instructions.  Will watch your condition.  Will get help right away if you are not doing well or get worse. Document Released: 06/27/2007 Document Revised: 10/03/2011 Document Reviewed: 08/09/2011 [facility] Patient Information 2014 [place], [place].

## 2013-02-13 LAB — URINE CULTURE
Colony Count: NO GROWTH
Culture: NO GROWTH

## 2013-02-14 NOTE — ED Provider Notes (Signed)
CSN: [id number]     Arrival date & time 02/12/13  1009 History   First MD Initiated Contact with Patient 02/12/13 1015     Chief Complaint  Patient presents with  . Abdominal Pain   (Consider location/radiation/quality/duration/timing/severity/associated sxs/prior Treatment) Patient is a 73 y.o. female presenting with abdominal pain. The history is provided by the patient.  Abdominal Pain Pain location:  RLQ and LLQ Pain quality: aching and dull   Pain radiates to:  Does not radiate Pain severity:  Mild Onset quality:  Gradual Duration:  5 days Timing:  Constant Progression:  Unchanged Chronicity:  Chronic Context: not eating, not previous surgeries, not recent illness, not sick contacts, not suspicious food intake and not trauma   Relieved by:  Nothing Worsened by:  Nothing tried Ineffective treatments:  Acetaminophen Associated symptoms: no chest pain, no chills, no constipation, no cough, no diarrhea, no dysuria, no fever, no hematemesis, no hematochezia, no hematuria, no melena, no nausea, no shortness of breath and no vomiting     Past Medical History  Diagnosis Date  . Hypercholesteremia   . Hypertension   . Acid reflux   . Anxiety   . Depression   . COPD (chronic obstructive pulmonary disease)   . S/P colonoscopy 2003    Dr. Nicolasa Ducking: normal colon, normal path.   . Renal disorder     pelvic kidney  . Gout   . Constipation    Past Surgical History  Procedure Laterality Date  . Abdominal hysterectomy      complete  . Rotator cuff repair    . Laparoscopy  2000    [place]-stomach pain-pt reports negative  . Colonoscopy  03/09/2011    [id number] diverticula, small internal hemorrhoids.   . Esophagogastroduodenoscopy   03/09/2011    [id number] in the distal esophagus/ Stenosis at the pylorus/ Mild gastritis/ Hiatal hernia. gastric bx showed gastritis but no H.pylori  . Esophagogastroduodenoscopy  03/20/11    SLF: stricture distal esophgus and stenosis at  pyloris likely from NSAIDs. s/p dialtion.   . 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 Esophagogastroduodenoscopy (egd) with esophageal dilation  12/03/2011    [id number] was found in the entire examined stomach/Stricture was found at the gastroesophageal junction   Family History  Problem Relation Age of Onset  . Kidney disease Sister   . Cirrhosis Sister     Non-etoh  . Cirrhosis Brother     ?etoh  . Colon cancer Neg Hx    History  Substance Use Topics  . Smoking status: Never Smoker   . Smokeless tobacco: Not on file  . Alcohol Use: No   OB History   Grav Para Term Preterm Abortions TAB SAB Ect Mult Living                 Review of Systems  Constitutional: Negative for fever, chills and appetite change.  Respiratory: Negative for cough, chest tightness and shortness of breath.   Cardiovascular: Negative for chest pain.  Gastrointestinal: Positive for abdominal pain. Negative for nausea, vomiting, diarrhea, constipation, blood in stool, melena, hematochezia, abdominal distention and hematemesis.  Genitourinary: Negative for dysuria, hematuria, flank pain, decreased urine volume and difficulty urinating.  Musculoskeletal: Negative for back  pain.  Skin: Negative for color change and rash.  Neurological: Negative for dizziness, weakness and numbness.  Hematological: Negative for adenopathy.  All other systems reviewed and are negative.    Allergies  Ivp dye  Home Medications   Current Outpatient Rx  Name  Route  Sig  Dispense  Refill  . acetaminophen (TYLENOL) 500 MG tablet   Oral   Take 1,000 mg by mouth 2 (two) times daily as needed for moderate pain. Pain           . albuterol (PROVENTIL HFA;VENTOLIN HFA) 108 (90 BASE) MCG/ACT inhaler   Inhalation   Inhale 2 puffs into the lungs every 6 (six) hours as needed. Shortness of breath          . amitriptyline (ELAVIL) 10 MG tablet   Oral   Take 10 mg by mouth at bedtime.          Marland Kitchen amLODipine (NORVASC) 10 MG tablet   Oral   Take 10 mg by mouth daily.         Marland Kitchen atorvastatin (LIPITOR) 40 MG tablet   Oral   Take 40 mg by mouth daily.          . benazepril-hydrochlorthiazide (LOTENSIN HCT) 20-12.5 MG per tablet   Oral   Take 1 tablet by mouth Daily.         . citalopram (CELEXA) 40 MG tablet   Oral   Take 40 mg by mouth daily.           Marland Kitchen esomeprazole (NEXIUM) 40 MG capsule      1 po 30 minutes prior to breakfast and supper   62 capsule   11   . fluticasone (FLONASE) 50 MCG/ACT nasal spray   Nasal   Place 2 sprays into the nose daily.           . fluticasone (FLOVENT HFA) 110 MCG/ACT inhaler   Inhalation   Inhale 1 puff into the lungs 2 (two) times daily as needed. Shortness of breath.         . levothyroxine (SYNTHROID, LEVOTHROID) 50 MCG tablet   Oral   Take 50 mcg by mouth daily.          Marland Kitchen LORazepam (ATIVAN) 1 MG tablet   Oral   Take 1 mg by mouth 2 (two) times daily.          Vladimir Faster Glycol-Propyl Glycol (SYSTANE OP)   Both Eyes   Place 2 drops into both eyes daily as needed (dry tears).          . Vitamin D, Ergocalciferol, (DRISDOL) 50000 UNITS CAPS   Oral   Take 50,000 Units by mouth every 14 (fourteen) days. Patient takes twice a month         . cephALEXin (KEFLEX) 500 MG capsule   Oral   Take 1 capsule (500 mg total) by mouth 4 (four) times daily. For 7 days   28 capsule   0   . naproxen (NAPROSYN) 375 MG tablet   Oral   Take 1 tablet (375 mg total) by mouth 2 (two) times daily. Take with food   20 tablet   0    BP 146/65  Pulse 87  Temp(Src) 98.2 F (36.8 C) (Oral)  Resp 18  Ht 5\' 2"  (1.575 m)  Wt 147 lb (66.679 kg)  BMI  26.88 kg/m2  SpO2 100%   Physical Exam  Nursing note and vitals reviewed. Constitutional: She is oriented to  person, place, and time. She appears well-developed and well-nourished. No distress.  HENT:  Head: Normocephalic and atraumatic.  Mouth/Throat: Oropharynx is clear and moist.  Neck: Normal range of motion. Neck supple.  Cardiovascular: Normal rate, regular rhythm, normal heart sounds and intact distal pulses.   No murmur heard. Pulmonary/Chest: Effort normal and breath sounds normal. No respiratory distress. She exhibits no tenderness.  Abdominal: Soft. Normal appearance and bowel sounds are normal. She exhibits no distension and no mass. There is no tenderness. There is no rebound, no guarding and no CVA tenderness.    Mild diffuse lower abdominal tenderness, no guarding or rebound tenderness  Musculoskeletal: Normal range of motion.  Lymphadenopathy:    She has no cervical adenopathy.  Neurological: She is alert and oriented to person, place, and time. She exhibits normal muscle tone. Coordination normal.  Skin: Skin is warm and dry. No rash noted.    ED Course  Procedures (including critical care time) Labs Review Labs Reviewed  URINALYSIS, ROUTINE W REFLEX MICROSCOPIC - Abnormal; Notable for the following:    Protein, ur TRACE (*)    Leukocytes, UA MODERATE (*)    All other components within normal limits  URINE MICROSCOPIC-ADD ON - Abnormal; Notable for the following:    Squamous Epithelial / LPF FEW (*)    Bacteria, UA FEW (*)    All other components within normal limits  BASIC METABOLIC PANEL - Abnormal; Notable for the following:    Glucose, Bld 106 (*)    GFR calc non Af Amer 54 (*)    GFR calc Af Amer 62 (*)    All other components within normal limits  URINE CULTURE  CBC WITH DIFFERENTIAL   Imaging Review Ct Abdomen Pelvis Wo Contrast  02/12/2013   CLINICAL DATA:  Lower abdominal cramping for 5 days.  EXAM: CT ABDOMEN AND PELVIS WITHOUT CONTRAST   TECHNIQUE: Multidetector CT imaging of the abdomen and pelvis was performed following the standard protocol without intravenous contrast.  COMPARISON:  CT abdomen and pelvis 02/08/2012.  FINDINGS: The lung bases are clear without focal nodule, mass, or airspace disease. The heart size is normal. No significant pleural or pericardial effusion is present.  The liver and spleen are within normal limits. Stomach, duodenum, and pancreas are unremarkable. The common bile duct and gallbladder are normal. The adrenal glands are normal bilaterally. A low-density lesion in the right kidney is stable. The left kidney is again noted to be in the pelvis.  Moderate stool is present throughout the colon. Contrast can be seen only to the distal transverse colon. More solid stool is present in the sigmoid. The appendix is visualized and within normal limits. The small bowel is unremarkable. No significant adenopathy or free fluid is present. Atherosclerotic calcifications are present within the aorta and iliac vessels without aneurysm. The urinary bladder is within normal limits.  The bone windows demonstrate focal endplate changes and loss of disc height at L2-3. No focal lytic or blastic lesions are present otherwise.  IMPRESSION: 1. Moderate stool throughout the colon with fairly dense stool and the sigmoid colon. There is no definite obstruction. 2. Atherosclerosis without evidence for aneurysm. 3. Stable low-density lesion of the right kidney.   Electronically Signed   By: Lawrence Santiago M.D.   On: 02/12/2013 13:38    EKG Interpretation   None       MDM   1. UTI (lower urinary tract infection)   2. Constipation    patient with diffuse lower  abdominal "cramping" for 5 days.  No peritoneal signs.  No fever, chills, chest pain, dyspnea, or vomiting.  Will order labs and CT abd  CT abd results discussed with the patient.  VSS, no vomiting, fever, chills.  Labs wnml except UTI.  Hx of complete hysterectomy.  No  concerning sx for acute abd.  Will treat for UTI and constipation, prescribed keflex and naprosyn.  Pt agrees to treatment plan and appears stable for discharge.  Issam Carlyon L. Analea Muller, PA-C 02/14/13 2300

## 2013-02-15 NOTE — ED Provider Notes (Signed)
Medical screening examination/treatment/procedure(s) were performed by non-physician practitioner and as supervising physician I was immediately available for consultation/collaboration.  EKG Interpretation   None         Alfonzo Feller, DO 02/15/13 1132

## 2013-04-07 ENCOUNTER — Other Ambulatory Visit: Payer: Self-pay

## 2013-04-09 MED ORDER — DEXLANSOPRAZOLE 60 MG PO CPDR: 60.0000 mg | DELAYED_RELEASE_CAPSULE | Freq: Every day | ORAL | Status: DC

## 2013-04-09 NOTE — Telephone Encounter (Signed)
Called and explained to pt. She will check on the [place] and let me know if she needs the samples. She had just gotten the generic Nexium on Mon and I told her not to take that one. Just the [place]. She is still taking the Celexa.

## 2013-04-09 NOTE — Telephone Encounter (Signed)
Is patient still taking Celexa 40 mg daily? There is a risk of EKG changes with taking Nexium BID and the current Celexa dose. I would like to try Dexilant for her once daily to see how she does. This will be a safer combination. I have sent into pharmacy. She may need samples if PA is needed.

## 2013-05-13 ENCOUNTER — Telehealth: Payer: Self-pay | Admitting: Gastroenterology

## 2013-05-13 NOTE — Telephone Encounter (Signed)
Patient called today wanting to make OV with SF. She is having a bitter taste in her mouth whenever she eats and also with water. I told her that SF was out of office today and she's scheduled out to June. She asked if the nurse maybe could recommend something and call her back. [phone] or [phone] Please advise

## 2013-05-14 NOTE — Telephone Encounter (Signed)
I called pt. She said she has been having some problems for about a week. She gets a bitter taste in her mouth after her meals regardless of what she eats. She has some nausea, but no vomiting. She has even had a decrease in appetite. She is taking the Dexilant about an hour before her evening meal, because she sleeps in a little in the morning and when she gets up, she is ready for toast or oatmeal.  Please advise!

## 2013-05-22 NOTE — Telephone Encounter (Signed)
Called and informed pt. Also, printed the info and mailed to her.

## 2013-05-22 NOTE — Telephone Encounter (Signed)
PLEASE CALL PT. SHE HAS GERD. IF SHE EATS THE WRONG THING SHE WILL HAVE A BITTER TASTE IN HER MOUTH. TO BETTER CONTROL HER REFLUX SHE SHOULD:    1. CONTINUE DEXILANT   2. Eat 4 TO 6 MEALS A DAY.    3. Loosen your belt.    4. Eliminate heartburn triggers. Everyone has specific triggers.    5. AVOID Common triggers such as fatty or fried foods, spicy food, tomato sauce, carbonated beverages, alcohol, chocolate, mint, garlic, onion, caffeine and nicotine may make heartburn worse.    6. Don't lie down after a meal. Wait at least three to four hours after eating before going to bed, and don't lie down right after eating.      7. CALL IN 2 WEEKS IF HER SX ARE NOT IMPROVED. SHE MAY NEED AN EGD TO PLACE A BRAVO CAPSULE TO ASSESS HOW WELL CONTROLLED HER ACID REFLUX IS.

## 2013-05-26 ENCOUNTER — Other Ambulatory Visit: Payer: Self-pay

## 2013-05-26 MED ORDER — DEXLANSOPRAZOLE 60 MG PO CPDR: 60.0000 mg | DELAYED_RELEASE_CAPSULE | Freq: Every day | ORAL | Status: DC

## 2013-06-01 ENCOUNTER — Telehealth: Payer: Self-pay | Admitting: Gastroenterology

## 2013-06-01 NOTE — Telephone Encounter (Signed)
PLEASE CALL PT. SHE NEEDS TO CALL HER PHARMACY AND ASK THEM WHAT IS COVERED. CALL us BACK AND I WILL WRITE FOR IT.

## 2013-06-01 NOTE — Telephone Encounter (Signed)
I called and informed pt.

## 2013-06-01 NOTE — Telephone Encounter (Signed)
Routing to Dr. Fields.  

## 2013-06-01 NOTE — Telephone Encounter (Signed)
Pt called to speak with DS and I told her that DS was on the other line and I would take a message. Patient said that [facility] in [place] told her that SF needs to call them before they fill her Dexilant. Please call patient back at [phone]. She said that she just spoke with DS and has been out of her medicine for 2 weeks.

## 2013-06-01 NOTE — Telephone Encounter (Signed)
Dexilant is not longer covered under her insurance and she cant afford to get it anymore, she asking for something cheaper, please advise?

## 2013-06-02 NOTE — Telephone Encounter (Signed)
I called pt and informed her that Almyra Free has received the PA request and is working on it.

## 2013-08-12 ENCOUNTER — Encounter (INDEPENDENT_AMBULATORY_CARE_PROVIDER_SITE_OTHER): Payer: Self-pay

## 2013-08-12 ENCOUNTER — Ambulatory Visit (INDEPENDENT_AMBULATORY_CARE_PROVIDER_SITE_OTHER): Payer: Medicare Other | Admitting: Gastroenterology

## 2013-08-12 ENCOUNTER — Encounter: Payer: Self-pay | Admitting: Gastroenterology

## 2013-08-12 VITALS — BP 130/76 | HR 80 | Temp 98.7°F | Ht 62.0 in | Wt 136.2 lb

## 2013-08-12 DIAGNOSIS — K219 Gastro-esophageal reflux disease without esophagitis: Secondary | ICD-10-CM | POA: Diagnosis not present

## 2013-08-12 DIAGNOSIS — K5900 Constipation, unspecified: Secondary | ICD-10-CM | POA: Diagnosis not present

## 2013-08-12 NOTE — Patient Instructions (Signed)
AVOID FOOD THAT CAUSES ABDOMINAL CRAMPS/GRIPING.  DRINK WATER TO KEEP YOUR URINE LIGHT YELLOW.  FOLLOW A HIGH FIBER/LOW FAT DIET.   CONTINUE DEXILANT.  FOLLOW UP IN 6-12 MOS.

## 2013-08-12 NOTE — Progress Notes (Signed)
Subjective:    Patient ID: Kelly Hebert, female    DOB: [birth year], 73 y.o.   MRN: [id number]  [name], NP  HPI Lives on a fixed income. CERTAIN FOODS UPSET HER STOMACH:CANTALOPE, APPLE SAUCE, WHITE CRANBERRY JUICE. 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 WANTS TO KNOW WHAT SHE CAN TAKE FOR PAIN. OCCASIONAL SUBJECTIVE CHILL. WHEN STOMACH GRIPES MAY HAVE DIARRHEA. LAST 2 WEEKS BOWELS MOVING PRETTY GOOD BUT CAN HAVE CONSTIPATION OFF AND ON- MAY GO 3X/WEEK. DEXILANT CONTROLS HEARTBURN. 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  PT DENIES FEVER, HEMATOCHEZIA, nausea, vomiting, melena, CHEST PAIN, SHORTNESS OF BREATH,  problems swallowing, OR heartburn or indigestion.  Past Medical History  Diagnosis Date  . Hypercholesteremia   . Hypertension   . Acid reflux   . Anxiety   . Depression   . COPD (chronic obstructive pulmonary disease)   . S/P colonoscopy 2003    Dr. Nicolasa Ducking: normal colon, normal path.   . Renal disorder     pelvic kidney  . Gout   . Constipation    Past Surgical History  Procedure Laterality Date  . Abdominal hysterectomy      complete  . Rotator cuff repair    . Laparoscopy  2000    [place]-stomach pain-pt reports negative  . Colonoscopy  03/09/2011    MEQ:ASTMHDQ diverticula, small internal hemorrhoids.   . Esophagogastroduodenoscopy   03/09/2011    QIW:LNLGXQJJH in the distal esophagus/ Stenosis at the pylorus/ Mild gastritis/ Hiatal hernia. gastric bx showed gastritis but no H.pylori  . Esophagogastroduodenoscopy  03/20/11    SLF: stricture distal esophgus and stenosis at pyloris likely from NSAIDs. s/p dialtion.   . Esophagogastroduodenoscopy (egd) with esophageal dilation  11/23/2011    SLF: Stricture was found at the gastroesophageal junction/ Small hiatal hernia/  Sessile polyp was found in the gastric body and gastric fundus/ Stenosis was found at the pylorus/ The duodenal mucosa showed no abnormalities  in the bulb and second portion of the duodenum  . Esophagogastroduodenoscopy (egd) with esophageal dilation  12/03/2011    ERD:EYCXK was found in the entire examined stomach/Stricture was found at the gastroesophageal junction   Allergies  Allergen Reactions  . Ivp Dye [Iodinated Diagnostic Agents] Hives and Itching    Pt. States this happened 4 or 5 years ago in [place].      Current Outpatient Prescriptions  Medication Sig Dispense Refill  . albuterol (PROVENTIL HFA;VENTOLIN HFA) 108 (90 BASE) MCG/ACT inhaler Inhale 2 puffs into the lungs every 6 (six) hours as needed. Shortness of breath     . amitriptyline (ELAVIL) 10 MG tablet Take 10 mg by mouth at bedtime.     Marland Kitchen amLODipine (NORVASC) 10 MG tablet Take 10 mg by mouth daily.    Marland Kitchen atorvastatin (LIPITOR) 40 MG tablet Take 40 mg by mouth daily.     . benazepril-hydrochlorthiazide (LOTENSIN HCT) 20-12.5 MG per tablet Take 1 tablet by mouth Daily.    . citalopram (CELEXA) 40 MG tablet Take 40 mg by mouth daily.      Marland Kitchen dexlansoprazole (DEXILANT) 60 MG capsule Take 1 capsule (60 mg total) by mouth daily.    . fluticasone (FLOVENT HFA) 110 MCG/ACT inhaler Inhale 1 puff into the lungs 2 (two) times daily as needed. Shortness of breath.    . levothyroxine (SYNTHROID, LEVOTHROID) 50 MCG tablet Take 50 mcg by mouth daily.       Marland Kitchen LORazepam (ATIVAN) 1 MG tablet Take 1 mg  by mouth 2 (two) times daily.   PRN 1X/DAY    . Vitamin D, Ergocalciferol, (DRISDOL) 50000 UNITS CAPS Take 50,000 Units by mouth every 14 (fourteen) days. Patient takes twice a month         Review of Systems     Objective:   Physical Exam        Assessment & Plan:

## 2013-08-12 NOTE — Assessment & Plan Note (Signed)
SX FAIRLY WELL CONTROLLED.  DRINK WATER EAT FIBER OPV IN 6-12 MOS

## 2013-08-12 NOTE — Assessment & Plan Note (Signed)
SX CONTROLLED.  CONTINUE TO MONITOR SYMPTOMS. LOW FAT DIET DEXILANT DAILY OPV IN 6-12 MOS. CALL IN DEC 2015 TO OBTAIN PA FOR DEXILANT.

## 2013-08-18 NOTE — Progress Notes (Signed)
Reminder in epic °

## 2013-11-18 ENCOUNTER — Emergency Department (HOSPITAL_COMMUNITY)
Admission: EM | Admit: 2013-11-18 | Discharge: 2013-11-18 | Disposition: A | Discharge status: Home / Self Care | Payer: Medicare Other | Attending: Emergency Medicine | Admitting: Emergency Medicine

## 2013-11-18 ENCOUNTER — Encounter (HOSPITAL_COMMUNITY): Payer: Self-pay | Admitting: Emergency Medicine

## 2013-11-18 ENCOUNTER — Emergency Department (HOSPITAL_COMMUNITY): Payer: Medicare Other

## 2013-11-18 DIAGNOSIS — I10 Essential (primary) hypertension: Secondary | ICD-10-CM | POA: Diagnosis not present

## 2013-11-18 DIAGNOSIS — E780 Pure hypercholesterolemia: Secondary | ICD-10-CM | POA: Insufficient documentation

## 2013-11-18 DIAGNOSIS — F329 Major depressive disorder, single episode, unspecified: Secondary | ICD-10-CM | POA: Diagnosis not present

## 2013-11-18 DIAGNOSIS — Z7952 Long term (current) use of systemic steroids: Secondary | ICD-10-CM | POA: Diagnosis not present

## 2013-11-18 DIAGNOSIS — Z9889 Other specified postprocedural states: Secondary | ICD-10-CM | POA: Diagnosis not present

## 2013-11-18 DIAGNOSIS — K219 Gastro-esophageal reflux disease without esophagitis: Secondary | ICD-10-CM | POA: Diagnosis not present

## 2013-11-18 DIAGNOSIS — F419 Anxiety disorder, unspecified: Secondary | ICD-10-CM | POA: Insufficient documentation

## 2013-11-18 DIAGNOSIS — Z87448 Personal history of other diseases of urinary system: Secondary | ICD-10-CM | POA: Insufficient documentation

## 2013-11-18 DIAGNOSIS — J449 Chronic obstructive pulmonary disease, unspecified: Secondary | ICD-10-CM | POA: Insufficient documentation

## 2013-11-18 DIAGNOSIS — Z79899 Other long term (current) drug therapy: Secondary | ICD-10-CM | POA: Insufficient documentation

## 2013-11-18 DIAGNOSIS — Z90710 Acquired absence of both cervix and uterus: Secondary | ICD-10-CM | POA: Diagnosis not present

## 2013-11-18 DIAGNOSIS — R112 Nausea with vomiting, unspecified: Secondary | ICD-10-CM | POA: Diagnosis not present

## 2013-11-18 DIAGNOSIS — R195 Other fecal abnormalities: Secondary | ICD-10-CM | POA: Diagnosis present

## 2013-11-18 DIAGNOSIS — K625 Hemorrhage of anus and rectum: Secondary | ICD-10-CM | POA: Diagnosis not present

## 2013-11-18 DIAGNOSIS — K5900 Constipation, unspecified: Secondary | ICD-10-CM | POA: Insufficient documentation

## 2013-11-18 DIAGNOSIS — Z7951 Long term (current) use of inhaled steroids: Secondary | ICD-10-CM | POA: Insufficient documentation

## 2013-11-18 LAB — CBC WITH DIFFERENTIAL/PLATELET
Basophils Absolute: 0.1 10*3/uL (ref 0.0–0.1)
Basophils Relative: 1 % (ref 0–1)
Eosinophils Absolute: 0.3 10*3/uL (ref 0.0–0.7)
Eosinophils Relative: 3 % (ref 0–5)
HCT: 37.5 % (ref 36.0–46.0)
Hemoglobin: 12.5 g/dL (ref 12.0–15.0)
LYMPHS ABS: 2.7 10*3/uL (ref 0.7–4.0)
LYMPHS PCT: 35 % (ref 12–46)
MCH: 30.1 pg (ref 26.0–34.0)
MCHC: 33.3 g/dL (ref 30.0–36.0)
MCV: 90.4 fL (ref 78.0–100.0)
MONO ABS: 0.7 10*3/uL (ref 0.1–1.0)
Monocytes Relative: 9 % (ref 3–12)
Neutro Abs: 4 10*3/uL (ref 1.7–7.7)
Neutrophils Relative %: 52 % (ref 43–77)
Platelets: 262 10*3/uL (ref 150–400)
RBC: 4.15 MIL/uL (ref 3.87–5.11)
RDW: 12.4 % (ref 11.5–15.5)
WBC: 7.7 10*3/uL (ref 4.0–10.5)

## 2013-11-18 LAB — BASIC METABOLIC PANEL
Anion gap: 14 (ref 5–15)
BUN: 11 mg/dL (ref 6–23)
CO2: 28 meq/L (ref 19–32)
CREATININE: 0.92 mg/dL (ref 0.50–1.10)
Calcium: 9.7 mg/dL (ref 8.4–10.5)
Chloride: 101 mEq/L (ref 96–112)
GFR calc Af Amer: 69 mL/min — ABNORMAL LOW (ref >90)
GFR, EST NON AFRICAN AMERICAN: 60 mL/min — AB (ref >90)
GLUCOSE: 113 mg/dL — AB (ref 70–99)
Potassium: 3.4 mEq/L — ABNORMAL LOW (ref 3.7–5.3)
Sodium: 143 mEq/L (ref 137–147)

## 2013-11-18 LAB — POC OCCULT BLOOD, ED
Fecal Occult Bld: NEGATIVE
Fecal Occult Bld: POSITIVE — AB

## 2013-11-18 MED ORDER — HYDROCORTISONE ACETATE 25 MG RE SUPP: 25.0000 mg | Freq: Two times a day (BID) | RECTAL | Status: DC

## 2013-11-18 NOTE — ED Notes (Signed)
MD at bedside. 

## 2013-11-18 NOTE — ED Notes (Signed)
MD at bedside. Occult stool completed.

## 2013-11-18 NOTE — ED Provider Notes (Signed)
CSN: [id number]     Arrival date & time 11/18/13  0830 History  This chart was scribed for Nat Christen, MD by Ludger Nutting, ED Scribe. This patient was seen in room APA18/APA18 and the patient's care was started 10:22 AM.    Chief Complaint  Patient presents with  . Blood In Stools   The history is provided by the patient. No language interpreter was used.    HPI Comments: Kelly Hebert is a 73 y.o. female who presents to the Emergency Department complaining of constipation that began 1 week ago. Patient states she took Dulcolax with relief. She states she became nauseated and vomited after having a few BM's. She states she noticed a very small amount of blood in the toilet bowl last week. She reports having abdominal bloating 3 days ago and then constipation again yesterday. She reports taking Colace yesterday with relief and noticed blood upon wiping. She has associated abdominal discomfort. She reports a history of frequent constipation. She denies nausea or decreased appetite. She states her last colonoscopy was in 2013.   GI Fields  Past Medical History  Diagnosis Date  . Hypercholesteremia   . Hypertension   . Acid reflux   . Anxiety   . Depression   . COPD (chronic obstructive pulmonary disease)   . S/P colonoscopy 2003    Dr. Nicolasa Ducking: normal colon, normal path.   . Renal disorder     pelvic kidney  . Gout   . Constipation    Past Surgical History  Procedure Laterality Date  . Abdominal hysterectomy      complete  . Rotator cuff repair    . Laparoscopy  2000    [place]-stomach pain-pt reports negative  . Colonoscopy  03/09/2011    QQI:WLNLGXQ diverticula, small internal hemorrhoids.   . Esophagogastroduodenoscopy   03/09/2011    JJH:ERDEYCXKG in the distal esophagus/ Stenosis at the pylorus/ Mild gastritis/ Hiatal hernia. gastric bx showed gastritis but no H.pylori  . Esophagogastroduodenoscopy  03/20/11    SLF: stricture distal esophgus and stenosis at pyloris likely  from NSAIDs. s/p dialtion.   . 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 Esophagogastroduodenoscopy (egd) with esophageal dilation  12/03/2011    YJE:HUDJS was found in the entire examined stomach/Stricture was found at the gastroesophageal junction   Family History  Problem Relation Age of Onset  . Kidney disease Sister   . Cirrhosis Sister     Non-etoh  . Cirrhosis Brother     ?etoh  . Colon cancer Neg Hx    History  Substance Use Topics  . Smoking status: Never Smoker   . Smokeless tobacco: Not on file  . Alcohol Use: No   OB History   Grav Para Term Preterm Abortions TAB SAB Ect Mult Living                 Review of Systems  A complete 10 system review of systems was obtained and all systems are negative except as noted in the HPI and PMH.    Allergies  Ivp dye  Home Medications   Prior to Admission medications   Medication Sig Start Date End Date Taking? Authorizing Provider  amitriptyline (ELAVIL) 10 MG  tablet Take 10 mg by mouth at bedtime.  07/29/12  Yes Historical Provider, MD  amLODipine (NORVASC) 10 MG tablet Take 10 mg by mouth daily.   Yes Historical Provider, MD  atorvastatin (LIPITOR) 40 MG tablet Take 40 mg by mouth daily.  04/10/12  Yes Historical Provider, MD  benazepril-hydrochlorthiazide (LOTENSIN HCT) 20-12.5 MG per tablet Take 1 tablet by mouth Daily. 10/03/11  Yes Historical Provider, MD  citalopram (CELEXA) 40 MG tablet Take 40 mg by mouth daily.     Yes Historical Provider, MD  dexlansoprazole (DEXILANT) 60 MG capsule Take 1 capsule (60 mg total) by mouth daily. 05/26/13  Yes Orvil Feil, NP  levothyroxine (SYNTHROID, LEVOTHROID) 50 MCG tablet Take 50 mcg  by mouth daily.  04/10/12  Yes Historical Provider, MD  LORazepam (ATIVAN) 1 MG tablet Take 1 mg by mouth 2 (two) times daily.    Yes Historical Provider, MD  Vitamin D, Ergocalciferol, (DRISDOL) 50000 UNITS CAPS Take 50,000 Units by mouth every 14 (fourteen) days. Patient takes twice a month   Yes Historical Provider, MD  albuterol (PROVENTIL HFA;VENTOLIN HFA) 108 (90 BASE) MCG/ACT inhaler Inhale 2 puffs into the lungs every 6 (six) hours as needed. Shortness of breath     Historical Provider, MD  fluticasone (FLOVENT HFA) 110 MCG/ACT inhaler Inhale 1 puff into the lungs 2 (two) times daily as needed. Shortness of breath.    Historical Provider, MD  hydrocortisone (ANUSOL-HC) 25 MG suppository Place 1 suppository (25 mg total) rectally 2 (two) times daily. For 7 days 11/18/13   Nat Christen, MD   BP 141/63  Pulse 69  Temp(Src) 98.4 F (36.9 C) (Oral)  Resp 16  Ht 5\' 2"  (1.575 m)  Wt 136 lb (61.689 kg)  BMI 24.87 kg/m2  SpO2 97% Physical Exam  Nursing note and vitals reviewed. Constitutional: She is oriented to person, place, and time. She appears well-developed and well-nourished.  HENT:  Head: Normocephalic and atraumatic.  Eyes: Conjunctivae and EOM are normal. Pupils are equal, round, and reactive to light.  Neck: Normal range of motion. Neck supple.  Cardiovascular: Normal rate, regular rhythm and normal heart sounds.   Pulmonary/Chest: Effort normal and breath sounds normal.  Abdominal: Soft. Bowel sounds are normal.  Genitourinary:  Rectal exam: No masses. Heme positive. Small amount of gross blood on glove  Musculoskeletal: Normal range of motion.  Neurological: She is alert and oriented to person, place, and time.  Skin: Skin is warm and dry.  Psychiatric: She has a normal mood and affect. Her behavior is normal.    ED Course  Procedures (including critical care time)  DIAGNOSTIC STUDIES: Oxygen Saturation is 99% on RA, normal by my interpretation.    COORDINATION OF  CARE: 10:27 AM Will order acute abdominal series and rectal exam. Discussed treatment plan with pt at bedside and pt agreed to plan.   Labs Review Labs Reviewed  BASIC METABOLIC PANEL - Abnormal; Notable for the following:    Potassium 3.4 (*)    Glucose, Bld 113 (*)    GFR calc non Af Amer 60 (*)    GFR calc Af Amer 69 (*)    All other components within normal limits  CBC WITH DIFFERENTIAL  POC OCCULT BLOOD, ED    Imaging Review Dg Abd Acute W/chest  11/18/2013   CLINICAL DATA:  Recent hematochezia  EXAM: ACUTE ABDOMEN SERIES (ABDOMEN 2 VIEW & CHEST 1 VIEW)  COMPARISON:  Chest radiograph November 08, 2011; CT abdomen and pelvis  February 12, 2013  FINDINGS: PA chest: There is no edema or consolidation. Heart size and pulmonary vascularity are normal. No adenopathy. The subtle nodular opacity in the left upper lobe seen on prior chest radiograph is not appreciable at this time.  Supine and upright abdomen: There is diffuse stool throughout the colon. Bowel gas pattern is unremarkable. No obstruction or free air. No abnormal calcification.  IMPRESSION: Diffuse stool throughout the colon. Overall bowel gas pattern unremarkable. No lung edema or consolidation.   Electronically Signed   By: Lowella Grip M.D.   On: 11/18/2013 11:25     EKG Interpretation None      MDM   Final diagnoses:  Constipation  Rectal bleeding   Patient is hemodynamically stable. Rectal exam shows heme positivity and less of stool in the rectal vault. Acute abdominal series confirms stool throughout the colon. Hemoglobin stable. Blood pressure stable.  Follow-up with Dr. Oneida Alar. Prescription for Anusol suppositories  I personally performed the services described in this documentation, which was scribed in my presence. The recorded information has been reviewed and is accurate.   Nat Christen, MD 11/18/13 [phone]

## 2013-11-18 NOTE — ED Notes (Signed)
Pt states she frequently has constipation. Last week took ducolax 2x and starting having blood in her stool. She stopped medication, it went away and constipation came back. She took a stool softner yesterday and began having bloody stool x1 yesterday.

## 2013-11-18 NOTE — Discharge Instructions (Signed)
For your constipation: Increase fluids, fruits, fibers.   Try over-the-counter products Dulcolax, Miralax, magnesium citrate.  Prescription for suppository. Follow-up your gastroenterologist.

## 2013-11-19 ENCOUNTER — Telehealth: Payer: Self-pay | Admitting: Gastroenterology

## 2013-11-19 MED ORDER — HYDROCORTISONE 2.5 % RE CREA: 1.0000 "application " | TOPICAL_CREAM | Freq: Two times a day (BID) | RECTAL | Status: DC

## 2013-11-19 NOTE — Telephone Encounter (Signed)
Patient called to say that her rx of hydrocorisone 25 mg suppository was going to cost $200 and she needed something cheaper called into [facility] in [place] on [address]. Please advise [phone] or [phone]

## 2013-11-19 NOTE — Telephone Encounter (Signed)
Routing to Neil Crouch, PA to advise!

## 2013-11-19 NOTE — Telephone Encounter (Signed)
LMOM that another prescription has been sent in.

## 2013-11-19 NOTE — Telephone Encounter (Signed)
By the way, the ED doc gave her the RX not [place].   I sent in Anusol cream, hopefully that will be cheaper.

## 2014-01-06 ENCOUNTER — Encounter: Payer: Medicare Other | Admitting: Gastroenterology

## 2014-01-06 ENCOUNTER — Telehealth: Payer: Self-pay | Admitting: Gastroenterology

## 2014-01-06 ENCOUNTER — Encounter: Payer: Self-pay | Admitting: Gastroenterology

## 2014-01-06 NOTE — Progress Notes (Signed)
   Subjective:    Patient ID: Kelly Hebert, female    DOB: 1940/02/27, 73 y.o.   MRN: [id number]  HPI   Past Medical History  Diagnosis Date  . Hypercholesteremia   . Hypertension   . Acid reflux   . Anxiety   . Depression   . COPD (chronic obstructive pulmonary disease)   . S/P colonoscopy 2003    Dr. Nicolasa Ducking: normal colon, normal path.   . Renal disorder     pelvic kidney  . Gout   . Constipation     Review of Systems     Objective:   Physical Exam        Assessment & Plan:

## 2014-01-06 NOTE — Telephone Encounter (Signed)
REVIEWED-NO ADDITIONAL RECOMMENDATIONS. 

## 2014-01-06 NOTE — Telephone Encounter (Signed)
PATIENT WAS A NO SHOW 01/06/14  LETTER SENT °

## 2014-01-11 ENCOUNTER — Encounter: Payer: Self-pay | Admitting: Gastroenterology

## 2014-03-02 ENCOUNTER — Encounter: Payer: Self-pay | Admitting: *Deleted

## 2014-03-08 ENCOUNTER — Encounter: Payer: Self-pay | Admitting: Obstetrics & Gynecology

## 2014-03-10 ENCOUNTER — Encounter: Payer: Self-pay | Admitting: Gastroenterology

## 2014-03-10 ENCOUNTER — Ambulatory Visit (INDEPENDENT_AMBULATORY_CARE_PROVIDER_SITE_OTHER): Payer: Medicare Other | Admitting: Gastroenterology

## 2014-03-10 VITALS — BP 133/73 | HR 80 | Temp 97.2°F | Ht 62.0 in | Wt 135.8 lb

## 2014-03-10 DIAGNOSIS — K5901 Slow transit constipation: Secondary | ICD-10-CM

## 2014-03-10 DIAGNOSIS — K219 Gastro-esophageal reflux disease without esophagitis: Secondary | ICD-10-CM

## 2014-03-10 NOTE — Assessment & Plan Note (Signed)
SX CONTROLLED.  AVOID REFLUX TRIGGERS. LOW FAT DIET FOLLOW UP IN 4 MOS.

## 2014-03-10 NOTE — Patient Instructions (Signed)
DRINK WATER TO KEEP YOUR URINE LIGHT YELLOW.  FOLLOW A HIGH FIBER DIET. SEE INFO BELOW.  ADD LINZESS 145 MCG 30 MINS PRIOR T0 BREAKFAST. IT MAY CAUSE EXPLOSIVE DIARRHEA.  FOLLOW UP IN 4 MOS.    High-Fiber Diet A high-fiber diet changes your normal diet to include more whole grains, legumes, fruits, and vegetables. Changes in the diet involve replacing refined carbohydrates with unrefined foods. The calorie level of the diet is essentially unchanged. The Dietary Reference Intake (recommended amount) for adult males is 38 grams per day. For adult females, it is 25 grams per day. Pregnant and lactating women should consume 28 grams of fiber per day. Fiber is the intact part of a plant that is not broken down during digestion. Functional fiber is fiber that has been isolated from the plant to provide a beneficial effect in the body. PURPOSE  Increase stool bulk.   Ease and regulate bowel movements.   Lower cholesterol.  INDICATIONS THAT YOU NEED MORE FIBER  Constipation and hemorrhoids.   Uncomplicated diverticulosis (intestine condition) and irritable bowel syndrome.   Weight management.   As a protective measure against hardening of the arteries (atherosclerosis), diabetes, and cancer.   GUIDELINES FOR INCREASING FIBER IN THE DIET  Start adding fiber to the diet slowly. A gradual increase of about 5 more grams (2 slices of whole-wheat bread, 2 servings of most fruits or vegetables, or 1 bowl of high-fiber cereal) per day is best. Too rapid an increase in fiber may result in constipation, flatulence, and bloating.   Drink enough water and fluids to keep your urine clear or pale yellow. Water, juice, or caffeine-free drinks are recommended. Not drinking enough fluid may cause constipation.   Eat a variety of high-fiber foods rather than one type of fiber.   Try to increase your intake of fiber through using high-fiber foods rather than fiber pills or supplements that contain small  amounts of fiber.   The goal is to change the types of food eaten. Do not supplement your present diet with high-fiber foods, but replace foods in your present diet.  INCLUDE A VARIETY OF FIBER SOURCES  Replace refined and processed grains with whole grains, canned fruits with fresh fruits, and incorporate other fiber sources. White rice, white breads, and most bakery goods contain little or no fiber.   Brown whole-grain rice, buckwheat oats, and many fruits and vegetables are all good sources of fiber. These include: broccoli, Brussels sprouts, cabbage, cauliflower, beets, sweet potatoes, white potatoes (skin on), carrots, tomatoes, eggplant, squash, berries, fresh fruits, and dried fruits.   Cereals appear to be the richest source of fiber. Cereal fiber is found in whole grains and bran. Bran is the fiber-rich outer coat of cereal grain, which is largely removed in refining. In whole-grain cereals, the bran remains. In breakfast cereals, the largest amount of fiber is found in those with "bran" in their names. The fiber content is sometimes indicated on the label.   You may need to include additional fruits and vegetables each day.   In baking, for 1 cup white flour, you may use the following substitutions:   1 cup whole-wheat flour minus 2 tablespoons.   1/2 cup white flour plus 1/2 cup whole-wheat flour.

## 2014-03-10 NOTE — Progress Notes (Signed)
ON RECALL LIST  °

## 2014-03-10 NOTE — Assessment & Plan Note (Signed)
SX UNCONTROLLED  DRINK WATER EAT FIBER DESCRIBED AMITIZA V. LINZESS SIDE EFFECTS. PT WANTS LINZESS. ADD LINZESS 145 MCG 30 MINS PRIOR T0 BREAKFAST. IT MAY CAUSE EXPLOSIVE DIARRHEA. FOLLOW UP IN 4 MOS.

## 2014-03-10 NOTE — Progress Notes (Signed)
Subjective:    Patient ID: Kelly Hebert, female    DOB: [birth year], 74 y.o.   MRN: [id number]  [name], NP  HPI OTC FEMALE DOESN'T WORK THAT WELL. DULCOLAX WORKS TOO WELL. NOW ON DEXILANT. COLACE DIDN'T HELP. BMs: LAST MOS REGULAR. LATELY NOT EVERY DAY Q3 DAYS AND NOT SOFT MOVEMENT. STILL HAS TWO GRANDSONS. GETS SWEATS A LOT. LAST BRBPR AFTER DULCOLAX JAN 2016. NAUSEA: MIGHT LAST 5-10 MINS EVER SINCE LAST YEAR.  PT DENIES FEVER, CHILLS, vomiting, melena, CHEST PAIN, SHORTNESS OF BREATH,  abdominal pain TODAY, problems swallowing, OR heartburn or indigestion.  Past Medical History  Diagnosis Date  . Hypercholesteremia   . Hypertension   . Acid reflux   . Anxiety   . Depression   . COPD (chronic obstructive pulmonary disease)   . S/P colonoscopy 2003    Dr. Nicolasa Ducking: normal colon, normal path.   . Renal disorder     pelvic kidney  . Gout   . Constipation   . Hypothyroidism   . Hyperglycemia   . Heart murmur   . Vitamin D deficiency disease   . Cancer     endometrial cancer   Past Surgical History  Procedure Laterality Date  . Abdominal hysterectomy      complete  . Rotator cuff repair    . Laparoscopy  2000    [place]-stomach pain-pt reports negative  . Colonoscopy  03/09/2011    YQM:VHQIONG diverticula, small internal hemorrhoids.   . Esophagogastroduodenoscopy   03/09/2011    EXB:MWUXLKGMW in the distal esophagus/ Stenosis at the pylorus/ Mild gastritis/ Hiatal hernia. gastric bx showed gastritis but no H.pylori  . Esophagogastroduodenoscopy  03/20/11    SLF: stricture distal esophgus and stenosis at pyloris likely from NSAIDs. s/p dialtion.   . Esophagogastroduodenoscopy (egd) with esophageal dilation  11/23/2011    SLF: Stricture was found at the gastroesophageal junction/ Small hiatal hernia/  Sessile polyp was found in the gastric body and gastric fundus/ Stenosis was found at the pylorus/ The duodenal mucosa showed no abnormalities in the bulb and  second portion of the duodenum  . Esophagogastroduodenoscopy (egd) with esophageal dilation  12/03/2011    NUU:VOZDG was found in the entire examined stomach/Stricture was found at the gastroesophageal junction  . Breast surgery      breast biopsy    Allergies  Allergen Reactions  . Ivp Dye [Iodinated Diagnostic Agents] Hives and Itching    Pt. States this happened 4 or 5 years ago in [place].    . Calcium Channel Blockers   . Nifedipine     Current Outpatient Prescriptions  Medication Sig Dispense Refill  . albuterol (PROVENTIL HFA;VENTOLIN HFA) 108 (90 BASE) MCG/ACT inhaler Inhale 2 puffs into the lungs every 6 (six) hours as needed. Shortness of breath     . amitriptyline (ELAVIL) 10 MG tablet Take 10 mg by mouth at bedtime.     Marland Kitchen amLODipine (NORVASC) 10 MG tablet Take 10 mg by mouth daily.    Marland Kitchen atorvastatin (LIPITOR) 40 MG tablet Take 40 mg by mouth daily.     . benazepril-hydrochlorthiazide (LOTENSIN HCT) 20-12.5 MG per tablet Take 1 tablet by mouth Daily.    . citalopram (CELEXA) 40 MG tablet Take 40 mg by mouth daily.      Marland Kitchen dexlansoprazole (DEXILANT) 60 MG capsule Take 1 capsule (60 mg total) by mouth daily.    . fluticasone (FLOVENT HFA) 110 MCG/ACT inhaler Inhale 1 puff into the lungs 2 (two) times daily  as needed. Shortness of breath.    . levothyroxine (SYNTHROID, LEVOTHROID) 50 MCG tablet Take 50 mcg by mouth daily.     Marland Kitchen LORazepam (ATIVAN) 1 MG tablet Take 1 mg by mouth 2 (two) times daily.     . Vitamin D, Ergocalciferol, (DRISDOL) 50000 UNITS CAPS Take 50,000 Units by mouth every 14 (fourteen) days. Patient takes twice a month    .      Marland Kitchen          Review of Systems     Objective:   Physical Exam  Constitutional: She is oriented to person, place, and time. She appears well-developed and well-nourished. No distress.  HENT:  Head: Normocephalic and atraumatic.  Mouth/Throat: Oropharynx is clear and moist. No oropharyngeal exudate.  Eyes: Pupils are equal,  round, and reactive to light. No scleral icterus.  Neck: Normal range of motion. Neck supple.  Cardiovascular: Normal rate, regular rhythm and normal heart sounds.   Pulmonary/Chest: Effort normal and breath sounds normal. No respiratory distress.  Abdominal: Soft. Bowel sounds are normal. She exhibits no distension. There is no tenderness.  Musculoskeletal: She exhibits no edema.  Lymphadenopathy:    She has no cervical adenopathy.  Neurological: She is alert and oriented to person, place, and time.  NO FOCAL DEFICITS   Psychiatric:  SLIGHTLY ANXIOUS MOOD, FLAT AFFECT   Vitals reviewed.         Assessment & Plan:

## 2014-03-13 NOTE — Progress Notes (Signed)
CC'ED TO PCP 

## 2014-03-16 ENCOUNTER — Ambulatory Visit (INDEPENDENT_AMBULATORY_CARE_PROVIDER_SITE_OTHER): Payer: Medicare Other | Admitting: Obstetrics & Gynecology

## 2014-03-16 ENCOUNTER — Encounter: Payer: Self-pay | Admitting: Obstetrics & Gynecology

## 2014-03-16 VITALS — BP 180/90 | Ht 62.0 in | Wt 137.0 lb

## 2014-03-16 DIAGNOSIS — R1031 Right lower quadrant pain: Secondary | ICD-10-CM

## 2014-03-16 DIAGNOSIS — R1032 Left lower quadrant pain: Secondary | ICD-10-CM

## 2014-03-16 DIAGNOSIS — I10 Essential (primary) hypertension: Secondary | ICD-10-CM | POA: Diagnosis not present

## 2014-03-16 DIAGNOSIS — C541 Malignant neoplasm of endometrium: Secondary | ICD-10-CM

## 2014-03-16 DIAGNOSIS — K5901 Slow transit constipation: Secondary | ICD-10-CM

## 2014-03-17 NOTE — Progress Notes (Signed)
Patient ID: Kelly Hebert, female   DOB: 02/11/1940, 74 y.o.   MRN: [id number] Chief Complaint  Patient presents with  . Referral    pelvic/ lower abdominal pain.   Remember:  The highest supported level for 2 out of 3 for history, physical and MDM determines the overall CPT code.  Chief Complaint  Patient presents with  . Referral    pelvic/ lower abdominal pain.     HPI:  1-3 elements(99212,99213)      [id number])             OR         Documentation of the status of 3 chronic medical prolems  The patient is seen in referral from [facility] for evaluation of bilateral lower quadrant pain Location:  Both sides. Quality:  Hervey Ard, . Severity:  Moderate with exaserbations. Timing:  daily. Duration:  30 minutes to hours. Context:  None associated. Modifying factors:  bettere with regualar BM Signs/Symptoms:    Problem Pertinent ROS:     [id number])      0(76808)        (Inquires about the system related to the problem identified in the HPI)  No burning with urination, frequency or urgency No nausea, vomiting or diarrhea Nor fever chills or other constitutional symptoms   Extended ROS:   2-9 systems total related to UPJ(03159)  No heartburn reflux changes, No RUQ pain No below her back hip or leg pain No chest pain or shortness of breath      [place]:    [phone])     2(86381)         Past Medical History  Diagnosis Date  . Hypercholesteremia   . Hypertension   . Acid reflux   . Anxiety   . Depression   . COPD (chronic obstructive pulmonary disease)   . S/P colonoscopy 2003    Dr. Nicolasa Ducking: normal colon, normal path.   . Renal disorder     pelvic kidney  . Gout   . Constipation   . Hypothyroidism   . Hyperglycemia   . Heart murmur   . Vitamin D deficiency disease   . Cancer     endometrial cancer  . Hx of pyloric stenosis 11/14/2011    Past Surgical History  Procedure Laterality Date  . Abdominal hysterectomy      complete  .  Rotator cuff repair    . Laparoscopy  2000    [place]-stomach pain-pt reports negative  . Colonoscopy  03/09/2011    RRN:HAFBXUX diverticula, small internal hemorrhoids.   . Esophagogastroduodenoscopy   03/09/2011    YBF:XOVANVBTY in the distal esophagus/ Stenosis at the pylorus/ Mild gastritis/ Hiatal hernia. gastric bx showed gastritis but no H.pylori  . Esophagogastroduodenoscopy  03/20/11    SLF: stricture distal esophgus and stenosis at pyloris likely from NSAIDs. s/p dialtion.   . 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 Esophagogastroduodenoscopy (egd) with esophageal dilation  12/03/2011    OMA:YOKHT was found in the entire examined stomach/Stricture was found at the gastroesophageal junction  . Breast surgery      breast biopsy     OB History    No data available  Allergies  Allergen Reactions  . Ivp Dye [Iodinated Diagnostic Agents] Hives and Itching    Pt. States this happened 4 or 5 years ago in [place].    . Calcium Channel Blockers   . Nifedipine     History   Social History  . Marital Status: Legally Separated    Spouse Name: N/A  . Number of Children: 3  . Years of Education: N/A   Occupational History  . retired Charity fundraiser    Social History Main Topics  . Smoking status: Never Smoker   . Smokeless tobacco: Never Used  . Alcohol Use: No  . Drug Use: No  . Sexual Activity: Not on file   Other Topics Concern  . None   Social History Narrative    Family History  Problem Relation Age of Onset  . Kidney disease Sister   . Cirrhosis Sister     Non-etoh  . Cirrhosis Brother     ?etoh  . Diabetes Brother   . Heart attack Brother   . Colon  cancer Neg Hx   . Heart disease Mother   . Heart attack Mother   . Heart disease Father   . Heart attack Father   . Diabetes Brother      Blood pressure 180/90, height 5\' 2"  (1.575 m), weight 137 lb (62.143 kg).  (3 vital signs=1 bullet)  Physical Examination:   5(91638)   2-4: 6 [phone])    5-7: 12 bullets(99214) Gen.  well-developed well-nourished female in no acute distress Abdomen soft mild distention no tenderness in either upper quadrant mild tenderness in both lower quadrants no rebound no guarding positive bowel sounds vuvla  Normal external female genitalia no lesions atrophic changes Vagina atrophic changes light pink no discharge no lesions cuff intact BiManual exam no midline or adnexal masses palpable nontender posteriorly the rectum is distended by hard stool External rectum no hemorrhoids  Patient had a CT scan performed which is reviewed and is found to be negative She had laboratory data performed which is reviewed and found to be negative  Impression 1.  Bilateral lower quadrant pain probably due to chronic constipation: Patient is on linzess that has only been on it for 1 week 2.  Endometrial cancer 2010 status post TAH/BSO no evidence of recurrence 3.  Chronic constipation 4.  Hypertension :elevated blood pressure today patient states she was nervous and did not take her medicine this morning  Plan Patient is instructed to take 2 Dulcolax suppositories today followed by a fleets enema tomorrow morning and prescribed Merrill asked powder to take 1 scoop 4 times a day and to her bowel movement softened significantly and then cut back to 2 times a day which hopefully is gone allow the linzess to kick in The do not think any further workup is required I do not think this is related to her endometrial cancer based on CT findings and my exam We will see her back in the future as needed he still needs yearly exams because of her history of endometrial  cancer  Medical decision making 3 more chronic problems, 1 acute problem, pharmacological management, no further labs or imaging

## 2014-03-24 ENCOUNTER — Telehealth: Payer: Self-pay | Admitting: Gastroenterology

## 2014-03-24 MED ORDER — LINACLOTIDE 145 MCG PO CAPS: 145.0000 ug | ORAL_CAPSULE | Freq: Every day | ORAL | Status: DC

## 2014-03-24 NOTE — Telephone Encounter (Signed)
Completed.

## 2014-03-24 NOTE — Telephone Encounter (Signed)
Pt aware and coupon for Linzess mailed to pt.

## 2014-03-24 NOTE — Addendum Note (Signed)
Addended by: Orvil Feil on: 03/24/2014 03:36 PM   Modules accepted: Orders

## 2014-03-24 NOTE — Telephone Encounter (Signed)
Forwarding to the refill box.  

## 2014-03-24 NOTE — Telephone Encounter (Signed)
PATIENT CALLED STATING THAT THE SAMPLES OF LINZESS ARE WORKING WELL AND WILL YOU PLEASE CALL A PRESCRIPTION INTO HER PHARMACY      [facility] [address] [place]      PATIENT NUMBER [phone]

## 2014-04-26 ENCOUNTER — Other Ambulatory Visit: Payer: Self-pay | Admitting: Gastroenterology

## 2014-06-03 ENCOUNTER — Encounter: Payer: Self-pay | Admitting: Gastroenterology

## 2014-10-21 ENCOUNTER — Ambulatory Visit: Payer: Medicare Other | Admitting: Gastroenterology

## 2014-11-04 ENCOUNTER — Ambulatory Visit: Payer: Medicare Other | Admitting: Gastroenterology

## 2015-03-16 ENCOUNTER — Encounter (HOSPITAL_COMMUNITY): Payer: Self-pay | Admitting: Emergency Medicine

## 2015-03-16 ENCOUNTER — Emergency Department (HOSPITAL_COMMUNITY)
Admission: EM | Admit: 2015-03-16 | Discharge: 2015-03-17 | Disposition: A | Discharge status: Home / Self Care | Payer: Medicare Other | Attending: Emergency Medicine | Admitting: Emergency Medicine

## 2015-03-16 ENCOUNTER — Emergency Department (HOSPITAL_COMMUNITY): Payer: Medicare Other

## 2015-03-16 DIAGNOSIS — F329 Major depressive disorder, single episode, unspecified: Secondary | ICD-10-CM | POA: Diagnosis not present

## 2015-03-16 DIAGNOSIS — R1030 Lower abdominal pain, unspecified: Secondary | ICD-10-CM | POA: Diagnosis present

## 2015-03-16 DIAGNOSIS — E876 Hypokalemia: Secondary | ICD-10-CM | POA: Diagnosis not present

## 2015-03-16 DIAGNOSIS — E039 Hypothyroidism, unspecified: Secondary | ICD-10-CM | POA: Diagnosis not present

## 2015-03-16 DIAGNOSIS — E7800 Pure hypercholesterolemia, unspecified: Secondary | ICD-10-CM | POA: Insufficient documentation

## 2015-03-16 DIAGNOSIS — J449 Chronic obstructive pulmonary disease, unspecified: Secondary | ICD-10-CM | POA: Diagnosis not present

## 2015-03-16 DIAGNOSIS — Z79899 Other long term (current) drug therapy: Secondary | ICD-10-CM | POA: Insufficient documentation

## 2015-03-16 DIAGNOSIS — R197 Diarrhea, unspecified: Secondary | ICD-10-CM | POA: Diagnosis not present

## 2015-03-16 DIAGNOSIS — I10 Essential (primary) hypertension: Secondary | ICD-10-CM | POA: Insufficient documentation

## 2015-03-16 DIAGNOSIS — R110 Nausea: Secondary | ICD-10-CM | POA: Insufficient documentation

## 2015-03-16 DIAGNOSIS — R109 Unspecified abdominal pain: Secondary | ICD-10-CM

## 2015-03-16 LAB — URINALYSIS, ROUTINE W REFLEX MICROSCOPIC
Bilirubin Urine: NEGATIVE
Glucose, UA: NEGATIVE mg/dL
Hgb urine dipstick: NEGATIVE
KETONES UR: NEGATIVE mg/dL
NITRITE: NEGATIVE
PH: 6 (ref 5.0–8.0)
Specific Gravity, Urine: 1.025 (ref 1.005–1.030)

## 2015-03-16 LAB — URINE MICROSCOPIC-ADD ON: RBC / HPF: NONE SEEN RBC/hpf (ref 0–5)

## 2015-03-16 LAB — COMPREHENSIVE METABOLIC PANEL
ALBUMIN: 3.7 g/dL (ref 3.5–5.0)
ALT: 41 U/L (ref 14–54)
AST: 32 U/L (ref 15–41)
Alkaline Phosphatase: 92 U/L (ref 38–126)
Anion gap: 7 (ref 5–15)
BUN: 15 mg/dL (ref 6–20)
CHLORIDE: 103 mmol/L (ref 101–111)
CO2: 25 mmol/L (ref 22–32)
Calcium: 8.9 mg/dL (ref 8.9–10.3)
Creatinine, Ser: 1.04 mg/dL — ABNORMAL HIGH (ref 0.44–1.00)
GFR calc Af Amer: 59 mL/min — ABNORMAL LOW (ref >60)
GFR calc non Af Amer: 51 mL/min — ABNORMAL LOW (ref >60)
GLUCOSE: 101 mg/dL — AB (ref 65–99)
POTASSIUM: 3 mmol/L — AB (ref 3.5–5.1)
Sodium: 135 mmol/L (ref 135–145)
Total Bilirubin: 0.3 mg/dL (ref 0.3–1.2)
Total Protein: 6.9 g/dL (ref 6.5–8.1)

## 2015-03-16 LAB — CBC WITH DIFFERENTIAL/PLATELET
BASOS ABS: 0 10*3/uL (ref 0.0–0.1)
Basophils Relative: 1 %
Eosinophils Absolute: 0.2 10*3/uL (ref 0.0–0.7)
Eosinophils Relative: 3 %
HEMATOCRIT: 34.7 % — AB (ref 36.0–46.0)
HEMOGLOBIN: 11.6 g/dL — AB (ref 12.0–15.0)
Lymphocytes Relative: 39 %
Lymphs Abs: 3 10*3/uL (ref 0.7–4.0)
MCH: 30.1 pg (ref 26.0–34.0)
MCHC: 33.4 g/dL (ref 30.0–36.0)
MCV: 89.9 fL (ref 78.0–100.0)
Monocytes Absolute: 0.9 10*3/uL (ref 0.1–1.0)
Monocytes Relative: 12 %
NEUTROS ABS: 3.4 10*3/uL (ref 1.7–7.7)
NEUTROS PCT: 45 %
Platelets: 279 10*3/uL (ref 150–400)
RBC: 3.86 MIL/uL — ABNORMAL LOW (ref 3.87–5.11)
RDW: 12.6 % (ref 11.5–15.5)
WBC: 7.6 10*3/uL (ref 4.0–10.5)

## 2015-03-16 LAB — LIPASE, BLOOD: Lipase: 96 U/L — ABNORMAL HIGH (ref 11–51)

## 2015-03-16 LAB — TROPONIN I: Troponin I: <0.03 ng/mL (ref <0.031)

## 2015-03-16 MED ORDER — ONDANSETRON HCL 4 MG/2ML IJ SOLN
INTRAMUSCULAR | Status: AC
  Filled 2015-03-16: qty 2

## 2015-03-16 MED ORDER — POTASSIUM CHLORIDE CRYS ER 20 MEQ PO TBCR
40.0000 meq | EXTENDED_RELEASE_TABLET | Freq: Once | ORAL | Status: AC
  Administered 2015-03-16: 40 meq via ORAL
  Filled 2015-03-16: qty 2

## 2015-03-16 MED ORDER — ONDANSETRON HCL 4 MG PO TABS: 4.0000 mg | ORAL_TABLET | Freq: Three times a day (TID) | ORAL | Status: DC | PRN

## 2015-03-16 MED ORDER — ONDANSETRON HCL 4 MG/2ML IJ SOLN
4.0000 mg | INTRAMUSCULAR | Status: DC | PRN
  Administered 2015-03-16: 4 mg via INTRAVENOUS

## 2015-03-16 MED ORDER — SODIUM CHLORIDE 0.9 % IV BOLUS (SEPSIS)
500.0000 mL | Freq: Once | INTRAVENOUS | Status: AC
  Administered 2015-03-16: 21:00:00 via INTRAVENOUS

## 2015-03-16 MED ORDER — DICYCLOMINE HCL 20 MG PO TABS: 20.0000 mg | ORAL_TABLET | Freq: Four times a day (QID) | ORAL | Status: DC | PRN

## 2015-03-16 MED ORDER — SODIUM CHLORIDE 0.9 % IV SOLN: INTRAVENOUS | Status: DC

## 2015-03-16 MED ORDER — MORPHINE SULFATE (PF) 2 MG/ML IV SOLN: 2.0000 mg | INTRAVENOUS | Status: DC | PRN

## 2015-03-16 NOTE — ED Provider Notes (Signed)
CSN: [id number]     Arrival date & time 03/16/15  1751 History   First MD Initiated Contact with Patient 03/16/15 1854     Chief Complaint  Patient presents with  . Diarrhea  . Nausea  . Abdominal Pain     HPI Pt was seen at [place].  Per pt, c/o gradual onset and persistence of constant generalized abd "pain" for the past 5 days.  Has been associated with low back "pain," nausea and multiple intermittent episodes of diarrhea.  Describes the abd pain as "cramping."  Describes the stools as "watery." Denies vomiting, no fevers, no rash, no CP/SOB, no black or blood in stools. Pt was evaluated by her PMD 2 days ago for these symptoms, and was told to come to the ED today if her symptoms continued. Pt states she has hx of "diverticulitis years ago" and is concerned regarding same.       Past Medical History  Diagnosis Date  . Hypercholesteremia   . Hypertension   . Acid reflux   . Anxiety   . Depression   . COPD (chronic obstructive pulmonary disease) ([place])   . S/P colonoscopy 2003    Dr. Nicolasa Ducking: normal colon, normal path.   . Renal disorder     pelvic kidney  . Gout   . Constipation   . Hypothyroidism   . Hyperglycemia   . Heart murmur   . Vitamin D deficiency disease   . Cancer [facility])     endometrial cancer  . Hx of pyloric stenosis 11/14/2011   Past Surgical History  Procedure Laterality Date  . Abdominal hysterectomy      complete  . Rotator cuff repair    . Laparoscopy  2000    [place]-stomach pain-pt reports negative  . Colonoscopy  03/09/2011    [id number] diverticula, small internal hemorrhoids.   . Esophagogastroduodenoscopy   03/09/2011    [id number] in the distal esophagus/ Stenosis at the pylorus/ Mild gastritis/ Hiatal hernia. gastric bx showed gastritis but no H.pylori  . Esophagogastroduodenoscopy  03/20/11    SLF: stricture distal esophgus and stenosis at pyloris likely from NSAIDs. s/p dialtion.   . Esophagogastroduodenoscopy (egd) with esophageal  dilation  11/23/2011    SLF: Stricture was found at the gastroesophageal junction/ Small hiatal hernia/  Sessile polyp was found in the gastric body and gastric fundus/ Stenosis was found at the pylorus/ The duodenal mucosa showed no abnormalities in the bulb and second portion of the duodenum  . Esophagogastroduodenoscopy (egd) with esophageal dilation  12/03/2011    [id number] was found in the entire examined stomach/Stricture was found at the gastroesophageal junction  . Breast surgery      breast biopsy    Family History  Problem Relation Age of Onset  . Kidney disease Sister   . Cirrhosis Sister     Non-etoh  . Cirrhosis Brother     ?etoh  . Diabetes Brother   . Heart attack Brother   . Colon cancer Neg Hx   . Heart disease Mother   . Heart attack Mother   . Heart disease Father   . Heart attack Father   . Diabetes Brother    Social History  Substance Use Topics  . Smoking status: Never Smoker   . Smokeless tobacco: Never Used  . Alcohol Use: No    Review of Systems ROS: Statement: All systems negative except as marked or noted in the HPI; Constitutional: Negative for fever and chills. ; ; Eyes: Negative for  eye pain, redness and discharge. ; ; ENMT: Negative for ear pain, hoarseness, nasal congestion, sinus pressure and sore throat. ; ; Cardiovascular: Negative for chest pain, palpitations, diaphoresis, dyspnea and peripheral edema. ; ; Respiratory: Negative for cough, wheezing and stridor. ; ; Gastrointestinal: +nausea, diarrhea, abd pain. Negative for vomiting, blood in stool, hematemesis, jaundice and rectal bleeding. . ; ; Genitourinary: Negative for dysuria, flank pain and hematuria. ; ; Musculoskeletal: +LBP. Negative for neck pain. Negative for swelling and trauma.; ; Skin: Negative for pruritus, rash, abrasions, blisters, bruising and skin lesion.; ; Neuro: Negative for headache, lightheadedness and neck stiffness. Negative for weakness, altered level of consciousness ,  altered mental status, extremity weakness, paresthesias, involuntary movement, seizure and syncope.      Allergies  Ivp dye; Calcium channel blockers; and Nifedipine  Home Medications   Prior to Admission medications   Medication Sig Start Date End Date Taking? Authorizing Provider  amitriptyline (ELAVIL) 10 MG tablet Take 10 mg by mouth at bedtime.  07/29/12  Yes Historical Provider, MD  amLODipine (NORVASC) 10 MG tablet Take 10 mg by mouth daily.   Yes Historical Provider, MD  atorvastatin (LIPITOR) 40 MG tablet Take 40 mg by mouth daily.  04/10/12  Yes Historical Provider, MD  benazepril-hydrochlorthiazide (LOTENSIN HCT) 20-12.5 MG per tablet Take 1 tablet by mouth Daily. 10/03/11  Yes Historical Provider, MD  Cholecalciferol (VITAMIN D-1000 MAX ST) 1000 units tablet Take 1,000 mg by mouth daily.   Yes Historical Provider, MD  citalopram (CELEXA) 40 MG tablet Take 40 mg by mouth daily.     Yes Historical Provider, MD  DEXILANT 60 MG capsule TAKE ONE CAPSULE BY MOUTH EVERY DAY 04/28/14  Yes Mahala Menghini, PA-C  levothyroxine (SYNTHROID, LEVOTHROID) 50 MCG tablet Take 50 mcg by mouth daily.  04/10/12  Yes Historical Provider, MD  Linaclotide Rolan Lipa) 145 MCG CAPS capsule Take 1 capsule (145 mcg total) by mouth daily. 03/24/14  Yes Orvil Feil, NP  LORazepam (ATIVAN) 1 MG tablet Take 1 mg by mouth 2 (two) times daily.    Yes Historical Provider, MD  potassium chloride SA (K-DUR,KLOR-CON) 20 MEQ tablet Take 20 mEq by mouth daily. 03/08/15  Yes Historical Provider, MD  Vitamin D, Ergocalciferol, (DRISDOL) 50000 UNITS CAPS Take 50,000 Units by mouth every 14 (fourteen) days. Patient takes twice a month   Yes Historical Provider, MD  albuterol (PROVENTIL HFA;VENTOLIN HFA) 108 (90 BASE) MCG/ACT inhaler Inhale 2 puffs into the lungs every 6 (six) hours as needed. Shortness of breath     Historical Provider, MD  fluticasone (FLOVENT HFA) 110 MCG/ACT inhaler Inhale 1 puff into the lungs 2 (two) times daily  as needed. Shortness of breath.    Historical Provider, MD  hydrocortisone (ANUSOL-HC) 2.5 % rectal cream Place 1 application rectally 2 (two) times daily. Patient not taking: Reported on 03/16/2015 11/19/13   Mahala Menghini, PA-C  hydrocortisone (ANUSOL-HC) 25 MG suppository Place 1 suppository (25 mg total) rectally 2 (two) times daily. For 7 days Patient not taking: Reported on 03/16/2015 11/18/13   Nat Christen, MD   BP 157/72 mmHg  Pulse 81  Temp(Src) 99.2 F (37.3 C) (Oral)  Resp 15  Ht 5\' 2"  (1.575 m)  Wt 138 lb (62.596 kg)  BMI 25.23 kg/m2  SpO2 100% Physical Exam  1900: Physical examination:  Nursing notes reviewed; Vital signs and O2 SAT reviewed;  Constitutional: Well developed, Well nourished, Well hydrated, In no acute distress; Head:  Normocephalic, atraumatic; Eyes:  EOMI, PERRL, No scleral icterus; ENMT: Mouth and pharynx normal, Mucous membranes moist; Neck: Supple, Full range of motion, No lymphadenopathy; Cardiovascular: Regular rate and rhythm, No gallop; Respiratory: Breath sounds clear & equal bilaterally, No wheezes.  Speaking full sentences with ease, Normal respiratory effort/excursion; Chest: Nontender, Movement normal; Abdomen: Soft, +LLQ, suprapubic, LLQ tender to palp. No rebound or guarding. Nondistended, Normal bowel sounds; Genitourinary: No CVA tenderness; Spine:  No midline CS, TS, LS tenderness. No rash.;; Extremities: Pulses normal, No tenderness, No edema, No calf edema or asymmetry.; Neuro: AA&Ox3, Major CN grossly intact.  Speech clear. No gross focal motor or sensory deficits in extremities.; Skin: Color normal, Warm, Dry.   ED Course  Procedures (including critical care time) Labs Review  Imaging Review  I have personally reviewed and evaluated these images and lab results as part of my medical decision-making.   EKG Interpretation None      MDM  MDM Reviewed: previous chart, nursing note and vitals Reviewed previous: labs and  ECG Interpretation: labs, ECG and CT scan     ED ECG REPORT   Date: 03/16/2015  Rate: 74  Rhythm: normal sinus rhythm  QRS Axis: normal  Intervals: normal  ST/T Wave abnormalities: normal, artifact  Conduction Disutrbances:none  Narrative Interpretation:   Old EKG Reviewed: unchanged; no significant changes from previous EKG dated 12/14/2009.  Results for orders placed or performed during the hospital encounter of 03/16/15  CBC with Differential  Result Value Ref Range   WBC 7.6 4.0 - 10.5 K/uL   RBC 3.86 (L) 3.87 - 5.11 MIL/uL   Hemoglobin 11.6 (L) 12.0 - 15.0 g/dL   HCT 34.7 (L) 36.0 - 46.0 %   MCV 89.9 78.0 - 100.0 fL   MCH 30.1 26.0 - 34.0 pg   MCHC 33.4 30.0 - 36.0 g/dL   RDW 12.6 11.5 - 15.5 %   Platelets 279 150 - 400 K/uL   Neutrophils Relative % 45 %   Neutro Abs 3.4 1.7 - 7.7 K/uL   Lymphocytes Relative 39 %   Lymphs Abs 3.0 0.7 - 4.0 K/uL   Monocytes Relative 12 %   Monocytes Absolute 0.9 0.1 - 1.0 K/uL   Eosinophils Relative 3 %   Eosinophils Absolute 0.2 0.0 - 0.7 K/uL   Basophils Relative 1 %   Basophils Absolute 0.0 0.0 - 0.1 K/uL  Urinalysis, Routine w reflex microscopic (not at [facility])  Result Value Ref Range   Color, Urine YELLOW YELLOW   APPearance CLEAR CLEAR   Specific Gravity, Urine 1.025 1.005 - 1.030   pH 6.0 5.0 - 8.0   Glucose, UA NEGATIVE NEGATIVE mg/dL   Hgb urine dipstick NEGATIVE NEGATIVE   Bilirubin Urine NEGATIVE NEGATIVE   Ketones, ur NEGATIVE NEGATIVE mg/dL   Protein, ur TRACE (A) NEGATIVE mg/dL   Nitrite NEGATIVE NEGATIVE   Leukocytes, UA TRACE (A) NEGATIVE  Comprehensive metabolic panel  Result Value Ref Range   Sodium 135 135 - 145 mmol/L   Potassium 3.0 (L) 3.5 - 5.1 mmol/L   Chloride 103 101 - 111 mmol/L   CO2 25 22 - 32 mmol/L   Glucose, Bld 101 (H) 65 - 99 mg/dL   BUN 15 6 - 20 mg/dL   Creatinine, Ser 1.04 (H) 0.44 - 1.00 mg/dL   Calcium 8.9 8.9 - 10.3 mg/dL   Total Protein 6.9 6.5 - 8.1 g/dL   Albumin 3.7 3.5 - 5.0  g/dL   AST 32 15 - 41 U/L   ALT  41 14 - 54 U/L   Alkaline Phosphatase 92 38 - 126 U/L   Total Bilirubin 0.3 0.3 - 1.2 mg/dL   GFR calc non Af Amer 51 (L) >60 mL/min   GFR calc Af Amer 59 (L) >60 mL/min   Anion gap 7 5 - 15  Lipase, blood  Result Value Ref Range   Lipase 96 (H) 11 - 51 U/L  Troponin I  Result Value Ref Range   Troponin I <0.03 <0.031 ng/mL  Urine microscopic-add on  Result Value Ref Range   Squamous Epithelial / LPF 0-5 (A) NONE SEEN   WBC, UA 0-5 0 - 5 WBC/hpf   RBC / HPF NONE SEEN 0 - 5 RBC/hpf   Bacteria, UA FEW (A) NONE SEEN   Urine-Other MUCOUS PRESENT     Ct Abdomen Pelvis Wo Contrast 03/16/2015  CLINICAL DATA:  Abdominal pain with diarrhea for several days. EXAM: CT ABDOMEN AND PELVIS WITHOUT CONTRAST TECHNIQUE: Multidetector CT imaging of the abdomen and pelvis was performed following the standard protocol without IV contrast. COMPARISON:  02/12/2013 FINDINGS: Lower chest:  Unremarkable. Hepatobiliary: No focal abnormality in the liver on this study without intravenous contrast. No evidence of hepatomegaly. There is no evidence for gallstones, gallbladder wall thickening, or pericholecystic fluid. No intrahepatic or extrahepatic biliary dilation. Pancreas: No focal mass lesion. No dilatation of the main duct. No intraparenchymal cyst. No peripancreatic edema. Spleen: No splenomegaly. No focal mass lesion. Adrenals/Urinary Tract: No adrenal nodule or mass. 3.7 cm water density lesion in the right kidney has increased minimally in size in the interval and likely represents a cyst. Left kidney is positioned in the anatomic pelvis and is stable in appearance. No hydronephrosis in either kidney. No evidence for hydroureter. The urinary bladder appears normal for the degree of distention. Stomach/Bowel: Stomach is nondistended. No gastric wall thickening. No evidence of outlet obstruction. Duodenum is normally positioned as is the ligament of Treitz. No small bowel wall  thickening. No small bowel dilatation. The terminal ileum is normal. The appendix is normal. No gross colonic mass. No colonic wall thickening. No substantial diverticular change. Vascular/Lymphatic: There is abdominal aortic atherosclerosis without aneurysm. There is no gastrohepatic or hepatoduodenal ligament lymphadenopathy. No intraperitoneal or retroperitoneal lymphadenopathy. No pelvic sidewall lymphadenopathy. Reproductive: Uterus is unremarkable.  There is no adnexal mass. Other: No intraperitoneal free fluid. Musculoskeletal: Bone windows reveal no worrisome lytic or sclerotic osseous lesions. IMPRESSION: 1. No acute findings in the abdomen or pelvis. Specifically, no findings to explain the patient's history of abdominal pain with diarrhea. 2. Abdominal aortic atherosclerosis. 3. Right renal cyst. Electronically Signed   By: Misty Stanley M.D.   On: 03/16/2015 21:55     2200:  Lipase mildly elevated. Potassium low. Otherwise, workup reassuring. No clear indication for admission at this time. Pt will need potassium repleted PO as well as PO fluid challenge. Dx and testing d/w pt and family.  Questions answered.  Verb understanding. Sign out to Dr. Dayna Barker.   Francine Graven, DO 03/19/15 2018

## 2015-03-16 NOTE — Discharge Instructions (Signed)
°Emergency Department Resource Guide °1) Find a Doctor and Pay Out of Pocket °Although you won't have to find out who is covered by your insurance plan, it is a good idea to ask around and get recommendations. You will then need to call the office and see if the doctor you have chosen will accept you as a new patient and what types of options they offer for patients who are self-pay. Some doctors offer discounts or will set up payment plans for their patients who do not have insurance, but you will need to ask so you aren't surprised when you get to your appointment. ° °2) Contact Your Local Health Department °Not all health departments have doctors that can see patients for sick visits, but many do, so it is worth a call to see if yours does. If you don't know where your local health department is, you can check in your phone book. The CDC also has a tool to help you locate your state's health department, and many state websites also have listings of all of their local health departments. ° °3) Find a Walk-in Clinic °If your illness is not likely to be very severe or complicated, you may want to try a walk in clinic. These are popping up all over the country in pharmacies, drugstores, and shopping centers. They're usually staffed by nurse practitioners or physician assistants that have been trained to treat common illnesses and complaints. They're usually fairly quick and inexpensive. However, if you have serious medical issues or chronic medical problems, these are probably not your best option. ° °No Primary Care Doctor: °- Call [facility] at  832-8000 - they can help you locate a primary care doctor that  accepts your insurance, provides certain services, etc. °- Physician Referral Service- 1-800-533-3463 ° °Chronic Pain Problems: °Organization         Address  Phone   Notes  °[facility]  (336) 297-2271 Patients need to be referred by their primary care doctor.  ° °Medication  Assistance: °Organization         Address  Phone   Notes  °[place] [facility] [address] °[place], [place] [address] (336) 641-8030 --Must be a resident of [place] °-- Must have NO insurance coverage whatsoever (no Medicaid/ Medicare, etc.) °-- The pt. MUST have a primary care doctor that directs their care regularly and follows them in the community °  °[facility]  (866) 331-1348   °[employer]  (888) 892-1162   ° °Agencies that provide inexpensive medical care: °Organization         Address  Phone   Notes  °[facility]  (336) 832-8035   °[facility]    (336) 832-7272   °[facility] [address] °[place], [place] [address] (336) 832-4777   °[facility] [address], °[place] (336) 271-4999   °[facility]    (336) 373-0678   °[facility]    (336) 272-1050   °[facility] ° [address], [place] Phone:  (336) 832-4444, Fax:  (336) 832-4440 Hours of Operation:  9 am - 6 pm, M-F.  Also accepts Medicaid/Medicare and self-pay.  °[facility] ° [address], [place] Phone: (336) 832-3150, Fax: (336) [phone]. Hours of Operation:  8:30 am - 5:30 pm, M-F.  Also accepts Medicaid and self-pay.  °[facility] [place] [address], [place] Phone: (336) 878-6027   °[facility] [address], [place], [place] (336)723-1848, Ext. 123 Mondays & Thursdays: 7-9 AM.  First 15 patients are seen on a first come, first serve basis. °  ° °Medicaid-accepting [place] Providers: ° °Organization         Address  Phone   Notes  °[facility] [address], [place] (336) 641-2100 Also accepts self-pay patients.  °[facility] [address], [place] ° (336) 856-9996   °[facility] [address], [place]  (336) 288-8857   °[facility] [address] [place] [address], [place] (336) 299-7000   °Veita Bland [address], [place]  ° (336) 373-1557 Only accepts [place] Access Medicaid patients after they have their name applied to their card.  ° °Self-Pay (no insurance) in [place]: ° °Organization         Address  Phone   Notes  °Sickle Cell Patients, [facility] [address], [place] (336) 832-1970   °[facility] [address], [place] (336) 832-4400   °[facility] ° [address], [place] (336) 992-4800   °[facility]/Dr. Osei-Bonsu ° [address] [place] [address], [place] or [address], [place] (336) 841-8500 Phone number for both [place] and [place] locations is the same.  °[facility] [address], [place] (336) 299-0000   °[facility] [address] [place] [address], [place] or [address] (336) 852-7530 °(336) 878-2260   °[facility] [address], [place] (336) 350-1642, phone; (336) 294-5005, fax Sees patients 1st and 3rd Saturday of every month.  Must not qualify for public or private insurance (i.e. Medicaid, Medicare, [place] Health Choice, Veterans' Benefits) • Household income should be no more than 200% of the poverty level •The clinic cannot treat you if you are pregnant or think you are pregnant • Sexually transmitted diseases are not treated at the clinic.  ° ° °Dental Care: °Organization         Address  Phone  Notes  °[place] [facility] [address], [place] (336) 641-6152 Accepts children up to age 21 who are enrolled in Medicaid or [place] Health Choice; pregnant women with a Medicaid card; and children who have applied for Medicaid or [place] Health Choice, but were declined, whose parents can pay a reduced fee at time of service.  °[place]  [facility] [place]  [address], [place] (336) 641-7733 Accepts children up to age 21 who are enrolled in Medicaid or [place] Health Choice; pregnant women with a Medicaid card; and children who have applied for Medicaid or [place] Health Choice, but were declined, whose parents can pay a reduced fee at time of service.  °[facility] ° [address],  (336) 641-4533 Patients are seen by appointment only. Walk-ins are not accepted. [facility] will see patients 18 years of age and older. °Monday - Tuesday (8am-5pm) °Most Wednesdays (8:30-5pm) °$30 per visit, cash only  °[facility] ° [address], [place] (336) 641-4533 Patients are seen by appointment only. Walk-ins are not accepted. [facility] will see patients 18 years of age and older. °One   Wednesday Evening (Monthly: Volunteer Based).  $30 per visit, cash only  °[facility]  (919) 537-3737 for adults; Children under age 4, call Graduate Pediatric Dentistry at ([phone]) [phone]. Children aged 4-14, please call (919) [phone] to request a pediatric application. ° Dental services are provided in all areas of dental care including fillings, crowns and bridges, complete and partial dentures, implants, gum treatment, root canals, and extractions. Preventive care is also provided. Treatment is provided to both adults and children. °Patients are selected via a lottery and there is often a waiting list. °  °[facility] [address], °[place] ° (336) 763-8833 [email or web address] °  °[facility] [address], [place], [place] (336)723-1848, Ext. 123 Second and Fourth Thursday of each month, opens at 6:30 AM; Clinic ends at 9 AM.  Patients are seen on a first-come first-served basis, and a limited number are seen during each clinic.  ° °[facility] ° [address], [place], [place] (336) 723-7904    Eligibility Requirements °You must have lived in [place], [place], or [place] counties for at least the last three months. °  You cannot be eligible for state or federal sponsored healthcare insurance, including Veterans Administration, Medicaid, or Medicare. °  You generally cannot be eligible for healthcare insurance through your employer.  °  How to apply: °Eligibility screenings are held every Tuesday and Wednesday afternoon from 1:00 pm until 4:00 pm. You do not need an appointment for the interview!  °[facility] [address], [place], [place] 336-631-2330   °[facility]  336-342-8273   °[place]  336-703-3100   °[facility]  336-570-6415   ° °Behavioral Health Resources in the Community: °Intensive Outpatient Programs °Organization         Address  Phone  Notes  °[facility] [address], [place], [place] 336-878-6098   °[facility] [address], [place], [place] 336-832-9800   °[facility] [address], [place], [place] ° 336-882-2125   °[facility] [address],  °[place], [place] 1-800-853-5163 or 336-641-4981   °Substance Abuse Resources °Organization         Address  Phone  Notes  °[facility]  336-882-2125   °[facility]  336-784-9470   °[facility]  336-285-9073   °[facility]  336-845-3988   °Residential & Outpatient Substance Abuse Program  1-800-659-3381   °Psychological Services °Organization         Address  Phone  Notes  °[facility]  336- 832-9600   °[facility]  336- 378-7881   °[facility] [address], [place] 1-800-853-5163 or 336-641-4981   ° °Mobile Crisis Teams °Organization         Address  Phone  Notes  °[facility], Mobile Crisis Care Unit  1-877-626-1772   °Assertive °[facility] ° [address]  [place], [place] 336-834-9664   °Sharon DeEsch [address] °[place] 336-554-5454   ° °Self-Help/Support Groups °Organization         Address  Phone             Notes  °[facility] [place] - variety of support groups  336- 373-1402 Call for more information  °Narcotics Anonymous (NA), [facility] [address], °[place]  2 meetings at this location  ° °  Residential Treatment Programs °Organization         Address  Phone  Notes  °[facility] [address],    °[place]  1-866-801-8205   °[facility] ° [address], [place], [place] 704-293-8524   °[facility] [address], [place] 336-845-3988 Admissions: 8am-3pm M-F  °[facility] [address]    °[place], [place] 336-841-1104   °[facility] [address], [place], [place] 336-379-7146   °[facility] [address]  °[place], [place] 336-285-9073   °[facility] - Intensive Outpatient [address], [place], [place] 336-852-3033   °[facility] [address]  °[place], [place] 1-877-615-2722 or 336-784-9470   °[facility] [address] [place], [place] 336-227-7417 Accepts Medicaid  °[facility] [address]  °[place] 1-800-659-3381 Substance Abuse/Addiction Treatment  ° °[place] Behavioral Health Resources °Organization         Address  Phone  Notes  °[facility]  (888) 581-9988   °Julie Brannon, PhD [address] [place], [place]   (336) 349-5553 or (336) 951-0000   °[facility]   [address] °[place], [place] (336) 349-4454   °[facility] [address], [place], [place] (336) 342-8316 Insurance/Medicaid/sponsorship through [facility]  °[facility] [address]                                    [place], [place] (336) 342-8316 Therapy/tele-psych/case    °[facility] [address]  ° [place], [place] (336) 349-2233    °Dr. Arfeen  (336) 349-4544   °[facility] [place]  [employer] [place] [facility] 1) [address], [place] °2) [address], [place] °3)  [address], [place] (336) 349-3220 °(336) 342-7768 ° °(336) 342-8140   °[place] Child Abuse [facility] (336) 342-1394 or (336) 342-3537 (After Hours)    ° ° °Take the prescriptions as directed.  Increase your fluid intake (ie:  Gatoraide) for the next few days.  Eat a bland diet and advance to your regular diet slowly as you can tolerate it.   Avoid full strength juices, as well as milk and milk products until your diarrhea has resolved.   Call your regular medical doctor tomorrow to schedule a follow up appointment in the next 2 days.  Return to the Emergency Department immediately if not improving (or even worsening) despite taking the medicines as prescribed, any black or bloody stool or vomit, if you develop a fever over "101," or for any other concerns. ° °

## 2015-03-16 NOTE — ED Notes (Signed)
Pt c/o n/d since Friday. deneis vomiting. Pt seen by pcp Monday and dx with virus. Pt c/o continued diarrhea with lower abd pain and lower back pain. Pt states she has h/s diverticulitis "years ago".

## 2015-03-17 NOTE — ED Provider Notes (Signed)
11:18 AM Assumed care from Dr. Thurnell Garbe, please see their note for full history, physical and decision making until this point. In brief this is a 75 y.o. year old female who presented to the ED tonight with Diarrhea; Nausea; and Abdominal Pain     Here with likely gastroenteritis. CT negative for diverticulitis. Labs ok aside from hypokalemia. Plan to PO challenge, observe for short period and if able to tolerate, dc.   Reexamination, abdomen soft, benign. Still with hyperactive bowel sounds and patient endorses flatulence but no worsening pain. VSS aside from slight HTN. Patient seems stable for discharge at this time. Will return here for new/worsening symptoms. otherwise follow up with PCP.   Labs, studies and imaging reviewed by myself and considered in medical decision making if ordered. Imaging interpreted by radiology.  Labs Reviewed  CBC WITH DIFFERENTIAL/PLATELET - Abnormal; Notable for the following:    RBC 3.86 (*)    Hemoglobin 11.6 (*)    HCT 34.7 (*)    All other components within normal limits  URINALYSIS, ROUTINE W REFLEX MICROSCOPIC (NOT AT [facility]) - Abnormal; Notable for the following:    Protein, ur TRACE (*)    Leukocytes, UA TRACE (*)    All other components within normal limits  COMPREHENSIVE METABOLIC PANEL - Abnormal; Notable for the following:    Potassium 3.0 (*)    Glucose, Bld 101 (*)    Creatinine, Ser 1.04 (*)    GFR calc non Af Amer 51 (*)    GFR calc Af Amer 59 (*)    All other components within normal limits  LIPASE, BLOOD - Abnormal; Notable for the following:    Lipase 96 (*)    All other components within normal limits  URINE MICROSCOPIC-ADD ON - Abnormal; Notable for the following:    Squamous Epithelial / LPF 0-5 (*)    Bacteria, UA FEW (*)    All other components within normal limits  TROPONIN I    CT Abdomen Pelvis Wo Contrast  Final Result      No Follow-up on file.   Merrily Pew, MD 03/17/15 1120

## 2015-03-24 ENCOUNTER — Encounter: Payer: Self-pay | Admitting: Gastroenterology

## 2015-03-24 ENCOUNTER — Ambulatory Visit (INDEPENDENT_AMBULATORY_CARE_PROVIDER_SITE_OTHER): Payer: Medicare Other | Admitting: Gastroenterology

## 2015-03-24 VITALS — BP 130/73 | HR 78 | Temp 97.9°F | Ht 62.0 in | Wt 136.0 lb

## 2015-03-24 DIAGNOSIS — K219 Gastro-esophageal reflux disease without esophagitis: Secondary | ICD-10-CM

## 2015-03-24 DIAGNOSIS — K5901 Slow transit constipation: Secondary | ICD-10-CM | POA: Diagnosis not present

## 2015-03-24 NOTE — Patient Instructions (Signed)
DRINK WATER TO KEEP YOUR URINE LIGHT YELLOW.  FOLLOW A HIGH FIBER DIET. AVOID ITEMS THAT CAUSE BLOATING & GAS.  TAKE LINZESS [place] T0 BREAKFAST Monday THROUGH FRIDAY.   PLEASE CALL WITH QUESTIONS OR CONCERNS.  FOLLOW UP IN 6-12 MOS.

## 2015-03-24 NOTE — Assessment & Plan Note (Signed)
SYMPTOMS FAIRLY WELL CONTROLLED. LINZESS MAY CAUSE LOOSE STOOLS IF TAKEN EVERY DAY FOR 7 DAYS.  DRINK WATER TO KEEP YOUR URINE LIGHT YELLOW. FOLLOW A HIGH FIBER DIET. AVOID ITEMS THAT CAUSE BLOATING & GAS. TITRATE LINZESS TO EFFECT. TAKE LINZESS [place] T0 BREAKFAST Monday THROUGH FRIDAY. SAMPLES GIVEN. CALL WITH QUESTIONS OR CONCERNS. FOLLOW UP IN 6-12 MOS.

## 2015-03-24 NOTE — Progress Notes (Signed)
cc'ed to pcp °

## 2015-03-24 NOTE — Progress Notes (Signed)
Subjective:    Patient ID: Kelly Hebert, female    DOB: Jul 06, 1940, 75 y.o.   MRN: [id number]  Antionette Char, MD  HPI LAST SEEN FOR CONSTIPATION. TAKING LINZESS AND SOMETIMES GIVES HER FREQUENT LOOSE STOOLS. IF TAKES FOR EVERY DAY FOR A WEEK. WHEN NEXT WEEK STARTS SHE GETS LOOSE STOOLS. HAD VIRAL ILLNESS THAT LANDED HER IN ED. HAD WATERY GREEN STOOLS. SORE DOWN IN LOWER ABDOMINAL PAIN. WITH VIRAL ILLNESS WOULD FEEL NAUSEATED BUT DID NOT HEAVE OR VOMIT. PT DENIES FEVER, CHILLS, HEMATOCHEZIA, nausea, vomiting, melena, CHEST PAIN, SHORTNESS OF BREATH,  CHANGE IN BOWEL IN HABITS,  abdominal pain, problems swallowing, OR heartburn or indigestion.   Past Medical History  Diagnosis Date  . Hypercholesteremia   . Hypertension   . Acid reflux   . Anxiety   . Depression   . COPD (chronic obstructive pulmonary disease) ([place])   . S/P colonoscopy 2003    Dr. Nicolasa Ducking: normal colon, normal path.   . Renal disorder     pelvic kidney  . Gout   . Constipation   . Hypothyroidism   . Hyperglycemia   . Heart murmur   . Vitamin D deficiency disease   . Cancer [facility])     endometrial cancer  . Hx of pyloric stenosis 11/14/2011    Past Surgical History  Procedure Laterality Date  . Abdominal hysterectomy      complete  . Rotator cuff repair    . Laparoscopy  2000    [place]-stomach pain-pt reports negative  . Colonoscopy  03/09/2011    [id number] diverticula, small internal hemorrhoids.   . Esophagogastroduodenoscopy   03/09/2011    [id number] in the distal esophagus/ Stenosis at the pylorus/ Mild gastritis/ Hiatal hernia. gastric bx showed gastritis but no H.pylori  . Esophagogastroduodenoscopy  03/20/11    SLF: stricture distal esophgus and stenosis at pyloris likely from NSAIDs. s/p dialtion.   . Esophagogastroduodenoscopy (egd) with esophageal dilation  11/23/2011    SLF: Stricture was found at the gastroesophageal junction/ Small hiatal hernia/  Sessile polyp was found in the gastric  body and gastric fundus/ Stenosis was found at the pylorus/ The duodenal mucosa showed no abnormalities in the bulb and second portion of the duodenum  . Esophagogastroduodenoscopy (egd) with esophageal dilation  12/03/2011    [id number] was found in the entire examined stomach/Stricture was found at the gastroesophageal junction  . Breast surgery      breast biopsy     Allergies  Allergen Reactions  . Ivp Dye [Iodinated Diagnostic Agents] Hives and Itching    Pt. States this happened 4 or 5 years ago in [place].    . Calcium Channel Blockers   . Nifedipine    Current Outpatient Prescriptions  Medication Sig Dispense Refill  . amLODipine (NORVASC) 10 MG tablet Take 10 mg by mouth daily.    Marland Kitchen atorvastatin (LIPITOR) 40 MG tablet Take 40 mg by mouth daily.     . benazepril-hydrochlorthiazide (LOTENSIN HCT) 20-12.5 MG per tablet Take 1 tablet by mouth Daily.    . Cholecalciferol (VITAMIN D-1000 MAX ST) 1000 units tablet Take 1,000 mg by mouth daily.    . citalopram (CELEXA) 40 MG tablet Take 40 mg by mouth daily.      Marland Kitchen DEXILANT 60 MG capsule TAKE ONE CAPSULE BY MOUTH EVERY DAY    . dicyclomine (BENTYL) 20 MG tablet Take 1 tablet (20 mg total) by mouth every 6 (six) hours as needed for spasms (abdominal cramping). NOT  TAKING IT ANYMORE   . fluticasone 110 MCG/ACT inhaler Inhale 1 puff into the lungs BID PRN FOR SOB     . levothyroxine (SYNTHROID, LEVOTHROID) 50 MCG tablet Take 50 mcg by mouth daily.     . Linaclotide (LINZESS) 145 MCG CAPS capsule Take 1 capsule (145 mcg total) by mouth daily.    Marland Kitchen LORazepam (ATIVAN) 1 MG tablet Take 1 mg by mouth 2 (two) times daily.     . potassium chloride SA (K-DUR,KLOR-CON) 20 MEQ tablet Take 20 mEq by mouth daily.    . Vitamin D, Ergocalciferol, (DRISDOL) 50000 UNITS CAPS Take 50,000 Units by mouth every 14 (fourteen) days. Patient takes twice a month    . albuterol (PROVENTIL HFA;VENTOLIN HFA) 108 (90 BASE) MCG/ACT inhaler Inhale 2 puffs into the  lungs every 6 (six) hours as needed. Reported on 03/24/2015    . amitriptyline (ELAVIL) 10 MG tablet Take 10 mg by mouth at bedtime.     .      .      .       Review of Systems PER HPI OTHERWISE ALL SYSTEMS ARE NEGATIVE.    Objective:   Physical Exam  Constitutional: She is oriented to person, place, and time. She appears well-developed and well-nourished. No distress.  HENT:  Head: Normocephalic and atraumatic.  Mouth/Throat: Oropharynx is clear and moist. No oropharyngeal exudate.  Eyes: Pupils are equal, round, and reactive to light. No scleral icterus.  Neck: Normal range of motion. Neck supple.  Cardiovascular: Normal rate, regular rhythm and normal heart sounds.   Pulmonary/Chest: Effort normal and breath sounds normal. No respiratory distress.  Abdominal: Soft. Bowel sounds are normal. She exhibits no distension. There is no tenderness.  Musculoskeletal: She exhibits no edema.  Lymphadenopathy:    She has no cervical adenopathy.  Neurological: She is alert and oriented to person, place, and time.  Psychiatric: She has a normal mood and affect.  Vitals reviewed.     Assessment & Plan:

## 2015-03-24 NOTE — Assessment & Plan Note (Signed)
SYMPTOMS CONTROLLED/RESOLVED WITH DEXILANT.  CONTINUE DEXILANT FOLLOW UP IN 6-12 MOS.

## 2015-03-25 NOTE — Progress Notes (Signed)
ON RECALL  °

## 2015-05-03 ENCOUNTER — Other Ambulatory Visit: Payer: Self-pay | Admitting: Gastroenterology

## 2015-07-27 ENCOUNTER — Encounter: Payer: Self-pay | Admitting: Gastroenterology

## 2016-01-28 ENCOUNTER — Other Ambulatory Visit: Payer: Self-pay | Admitting: Gastroenterology

## 2016-03-19 ENCOUNTER — Other Ambulatory Visit: Payer: Self-pay | Admitting: Nurse Practitioner

## 2016-04-17 ENCOUNTER — Other Ambulatory Visit (HOSPITAL_COMMUNITY): Payer: Self-pay | Admitting: Nurse Practitioner

## 2016-04-17 DIAGNOSIS — R0989 Other specified symptoms and signs involving the circulatory and respiratory systems: Secondary | ICD-10-CM

## 2016-04-20 ENCOUNTER — Ambulatory Visit (HOSPITAL_COMMUNITY)
Admission: RE | Admit: 2016-04-20 | Discharge: 2016-04-20 | Disposition: A | Discharge status: Home / Self Care | Payer: Medicare Other | Source: Ambulatory Visit | Attending: Nurse Practitioner | Admitting: Nurse Practitioner

## 2016-04-20 DIAGNOSIS — R0989 Other specified symptoms and signs involving the circulatory and respiratory systems: Secondary | ICD-10-CM | POA: Diagnosis present

## 2016-04-20 DIAGNOSIS — I6523 Occlusion and stenosis of bilateral carotid arteries: Secondary | ICD-10-CM | POA: Diagnosis not present

## 2016-04-24 ENCOUNTER — Other Ambulatory Visit (HOSPITAL_COMMUNITY): Payer: Self-pay | Admitting: Nurse Practitioner

## 2016-04-25 ENCOUNTER — Other Ambulatory Visit (HOSPITAL_COMMUNITY): Payer: Self-pay | Admitting: Nurse Practitioner

## 2016-04-25 DIAGNOSIS — R9389 Abnormal findings on diagnostic imaging of other specified body structures: Secondary | ICD-10-CM

## 2016-05-08 ENCOUNTER — Ambulatory Visit (HOSPITAL_COMMUNITY)
Admission: RE | Admit: 2016-05-08 | Discharge: 2016-05-08 | Disposition: A | Discharge status: Home / Self Care | Payer: Medicare Other | Source: Ambulatory Visit | Attending: Nurse Practitioner | Admitting: Nurse Practitioner

## 2016-05-08 ENCOUNTER — Encounter (HOSPITAL_COMMUNITY): Payer: Self-pay

## 2016-05-08 DIAGNOSIS — I6523 Occlusion and stenosis of bilateral carotid arteries: Secondary | ICD-10-CM | POA: Diagnosis not present

## 2016-05-08 DIAGNOSIS — I700 Atherosclerosis of aorta: Secondary | ICD-10-CM | POA: Diagnosis not present

## 2016-05-08 DIAGNOSIS — J8410 Pulmonary fibrosis, unspecified: Secondary | ICD-10-CM | POA: Insufficient documentation

## 2016-05-08 DIAGNOSIS — R938 Abnormal findings on diagnostic imaging of other specified body structures: Secondary | ICD-10-CM | POA: Insufficient documentation

## 2016-05-08 DIAGNOSIS — R9389 Abnormal findings on diagnostic imaging of other specified body structures: Secondary | ICD-10-CM

## 2016-05-08 LAB — POCT I-STAT CREATININE: Creatinine, Ser: 1.1 mg/dL — ABNORMAL HIGH (ref 0.44–1.00)

## 2016-05-08 MED ORDER — IOPAMIDOL (ISOVUE-370) INJECTION 76%
75.0000 mL | Freq: Once | INTRAVENOUS | Status: AC | PRN
  Administered 2016-05-08: 75 mL via INTRAVENOUS

## 2016-05-17 ENCOUNTER — Encounter: Payer: Self-pay | Admitting: Gastroenterology

## 2016-05-17 ENCOUNTER — Ambulatory Visit (INDEPENDENT_AMBULATORY_CARE_PROVIDER_SITE_OTHER): Payer: Medicare Other | Admitting: Gastroenterology

## 2016-05-17 DIAGNOSIS — R1013 Epigastric pain: Secondary | ICD-10-CM | POA: Insufficient documentation

## 2016-05-17 DIAGNOSIS — K219 Gastro-esophageal reflux disease without esophagitis: Secondary | ICD-10-CM

## 2016-05-17 DIAGNOSIS — K5904 Chronic idiopathic constipation: Secondary | ICD-10-CM

## 2016-05-17 NOTE — Progress Notes (Signed)
Subjective:    Patient ID: Kelly Hebert, female    DOB: 1940/09/27, 76 y.o.   MRN: [id number]  Antionette Char, MD  HPI c/o swelling in neck after she eats. May burps and it comes back and burns in her throat. MAY HAPPEN WHEN SHE EAST THINGS SHE NOT SUPPOSE. AFTER EAT SANDWICH WITH MAYO SHE HAS SYMPTOMS. DOESN'T PREFER MUSTARD. FEELS BLOATED: A LOT. MILK: LACTAID (1 GLASS EVERY OTHER DAY) CHEESE: NO ICE CREAM: BASICALLY EVERY DAY. MAY FEEL HOT AND THEN COLD. BMs: W/O LINZESS WON'T MOVE BUT 2X/WEEK. TAKES LINZES 2-3 TIMES A WEEK. FEELS NAUSEATED 1-2X/WEEK. LACTOSE FREE ICE CREAM IS TOO EXPENSIVE. HEARTBURN/REGURGITATION NOT IDEALLY CONTROLLED. MAY HAVE SHARP/STABBING LUQ PAIN(SECS-MINS, SEVER ENOUGH TO TAKE HER BREATH, BETTER WITH: ALKA SELTZER). HAD CT RECENTLY DUE TO POSSIBLE BLOCKAGE IN HER NECK.  PT DENIES FEVER, CHILLS, HEMATOCHEZIA, HEMATEMESIS, vomiting, melena,  CHEST PAIN, SHORTNESS OF BREATH, CHANGE IN BOWEL IN HABITS, OR problems swallowing.   Past Medical History:  Diagnosis Date  . Acid reflux   . Anxiety   . Cancer [facility])    endometrial cancer  . Constipation   . COPD (chronic obstructive pulmonary disease) ([place])   . Depression   . Gout   . Heart murmur   . Hx of pyloric stenosis 11/14/2011  . Hypercholesteremia   . Hyperglycemia   . Hypertension   . Hypothyroidism   . Renal disorder    pelvic kidney  . S/P colonoscopy 2003   Dr. Nicolasa Ducking: normal colon, normal path.   . Vitamin D deficiency disease     Past Surgical History:  Procedure Laterality Date  . ABDOMINAL HYSTERECTOMY     complete  . BREAST SURGERY     breast biopsy   . COLONOSCOPY  03/09/2011   VZC:HYIFOYD diverticula, small internal hemorrhoids.   . ESOPHAGOGASTRODUODENOSCOPY   03/09/2011   XAJ:OINOMVEHM in the distal esophagus/ Stenosis at the pylorus/ Mild gastritis/ Hiatal hernia. gastric bx showed gastritis but no H.pylori  . ESOPHAGOGASTRODUODENOSCOPY  03/20/11   SLF: stricture distal esophgus  and stenosis at pyloris likely from NSAIDs. s/p dialtion.   . ESOPHAGOGASTRODUODENOSCOPY (EGD) WITH ESOPHAGEAL DILATION  11/23/2011   SLF: Stricture was found at the gastroesophageal junction/ Small hiatal hernia/  Sessile polyp was found in the gastric body and gastric fundus/ Stenosis was found at the pylorus/ The duodenal mucosa showed no abnormalities in the bulb and second portion of the duodenum  . ESOPHAGOGASTRODUODENOSCOPY (EGD) WITH ESOPHAGEAL DILATION  12/03/2011   CNO:BSJGG was found in the entire examined stomach/Stricture was found at the gastroesophageal junction  . LAPAROSCOPY  2000   [place]-stomach pain-pt reports negative  . ROTATOR CUFF REPAIR     Allergies  Allergen Reactions  . Ivp Dye [Iodinated Diagnostic Agents] Hives and Itching    Pt. States this happened 4 or 5 years ago in [place].    . Calcium Channel Blockers   . Nifedipine     Current Outpatient Prescriptions  Medication Sig Dispense Refill  . amitriptyline (ELAVIL) 10 MG tablet Take 10 mg by mouth at bedtime.     Marland Kitchen amLODipine (NORVASC) 10 MG tablet Take 10 mg by mouth daily.    Marland Kitchen atorvastatin (LIPITOR) 40 MG tablet Take 40 mg by mouth daily.     . benazepril-hydrochlorthiazide (LOTENSIN HCT) 20-12.5 MG per tablet Take 1 tablet by mouth Daily.    . citalopram (CELEXA) 40 MG tablet Take 40 mg by mouth daily.      Marland Kitchen DEXILANT  60 MG capsule TAKE 1 CAPSULE BY MOUTH EVERY DAY    . levothyroxine (SYNTHROID, LEVOTHROID) 50 MCG tablet Take 50 mcg by mouth daily.     . Linaclotide (LINZESS) 145 MCG CAPS capsule Take 1 capsule (145 mcg total) by mouth daily.    Marland Kitchen LORazepam (ATIVAN) 1 MG tablet Take 1 mg by mouth 2 (two) times daily.     Marland Kitchen albuterol (PROVENTIL HFA;VENTOLIN HFA) 108 (90 BASE) MCG/ACT inhaler Inhale 2 puffs into the lungs every 6 (six) hours as needed. Reported on 03/24/2015    . Cholecalciferol (VITAMIN D-1000 MAX ST) 1000 units tablet Take 1,000 mg by mouth daily.    .      . fluticasone (FLOVENT  HFA) 110 MCG/ACT inhaler Inhale 1 puff into the lungs 2 (two) times daily as needed. Shortness of breath.    .      .      .      .      .       Review of Systems PER HPI OTHERWISE ALL SYSTEMS ARE NEGATIVE.    Objective:   Physical Exam  Constitutional: She is oriented to person, place, and time. She appears well-developed and well-nourished. No distress.  HENT:  Head: Normocephalic and atraumatic.  Mouth/Throat: Oropharynx is clear and moist. No oropharyngeal exudate.  Eyes: Pupils are equal, round, and reactive to light. No scleral icterus.  Neck: Normal range of motion. Neck supple.  Cardiovascular: Normal rate, regular rhythm and normal heart sounds.   Pulmonary/Chest: Effort normal and breath sounds normal. No respiratory distress.  Abdominal: Soft. Bowel sounds are normal. She exhibits no distension. There is tenderness. There is no rebound and no guarding.  MILD RLQ TTP  Musculoskeletal: She exhibits no edema.  Lymphadenopathy:    She has no cervical adenopathy.  Neurological: She is alert and oriented to person, place, and time.  Psychiatric: She has a normal mood and affect.  Vitals reviewed.     Assessment & Plan:

## 2016-05-17 NOTE — Assessment & Plan Note (Signed)
SYMPTOMS FAIRLY WELL CONTROLLED IF SHE AVOIDS TRIGGERS.  CONTINUE DEXILANT. FOLLOW UP IN 6 MOS.

## 2016-05-17 NOTE — Assessment & Plan Note (Signed)
SYMPTOMS FAIRLY WELL CONTROLLED BUT LINZESS CAUSES LOOSE STOOLS/DIARRHEA.   TO PREVENT DIARRHEA AFTER TAKING LINZESS, OPEN LINZESS CAPSULE. PLACE GRANULES IN 4 TEASPOONS OF WATER. STIR IT FOR 30 SECONDS. TAKE 2 TSPS OF THE WATER DAILY FOR 4 DAYS IF NO SATISFACTORY BM THEN OPEN ANOTHER CAPSULE,PLACE IN 4 TSP OF WATER AND TAKE 3 TSP DAILY. YOU DO NOT NEED TO TAKE THE GRANULES THE MEDICINE IS IN THE WATER. IT MAY CAUSE EXPLOSIVE DIARRHEA. DRINK WATER TO KEEP YOUR URINE LIGHT YELLOW. FOLLOW A HIGH FIBER DIET. AVOID ITEMS THAT CAUSE BLOATING & GAS.  HANDOUT GIVEN FOLLOW UP IN 6 MOS.

## 2016-05-17 NOTE — Progress Notes (Signed)
ON RECALL  °

## 2016-05-17 NOTE — Patient Instructions (Addendum)
TO REDUCE GAS, BLOATING, AND REGURGITATION, ADD LACTASE 3 PILLS WITH MEALS THREE TO FOUR TIMES A DAY TO HELP DIGEST MAYONNAISE AND ICE CREAM AND    TO PREVENT DIARRHEA AFTER TAKING LINZESS, OPEN LINZESS CAPSULE. PLACE GRANULES IN 4 TEASPOONS OF WATER. STIR IT FOR 30 SECONDS. TAKE 2 TSPS OF THE WATER DAILY FOR 4 DAYS IF NO SATISFACTORY BM THEN OPEN ANOTHER CAPSULE,PLACE IN 4 TSP OF WATER AND TAKE 3 TSP DAILY. YOU DO NOT NEED TO TAKE THE GRANULES THE MEDICINE IS IN THE WATER. IT MAY CAUSE EXPLOSIVE DIARRHEA.   DRINK WATER TO KEEP YOUR URINE LIGHT YELLOW.  FOLLOW A HIGH FIBER DIET. AVOID ITEMS THAT CAUSE BLOATING & GAS. SEE INFO BELOW.  CONTINUE DEXILANT.  FOLLOW UP IN 6 MOS.   BLOATING AND GAS PREVENTION  Although gas may be uncomfortable and embarrassing, it is not life-threatening. Understanding causes, ways to reduce symptoms, and treatment will help most people find some relief.  Points to remember . Everyone has gas in the digestive tract. Marland Kitchen People often believe normal passage of gas to be excessive.  . Gas comes from two main sources: swallowed air and normal breakdown of certain foods by harmless bacteria naturally present in the large intestine. . Many foods with carbohydrates can cause gas. Fats and proteins cause little gas.  . Foods that may cause gas include o beans  o vegetables, such as broccoli, cabbage, brussels sprouts, onions, artichokes, and asparagus  o fruits, such as pears, apples, and peaches  o whole grains, such as whole wheat and bran  o soft drinks and fruit drinks  o milk and milk products, such as cheese and ice cream, and packaged foods prepared with lactose, such as bread, cereal, and salad dressing  o foods containing sorbitol, such as dietetic foods and sugar free candies and gums  . The most common symptoms of gas are belching, flatulence, bloating, and abdominal pain. However, some of these symptoms are often caused by an intestinal disorder, such as  irritable bowel syndrome, rather than too much gas.  . The most common ways to reduce the discomfort of gas are changing diet, taking nonprescription medicines, and reducing the amount of air swallowed.  . Digestive enzymes, such as lactase supplements, actually help digest carbohydrates and may allow people to eat foods that normally cause gas.

## 2016-05-17 NOTE — Assessment & Plan Note (Addendum)
SYMPTOMS NOT IDEALLY CONTROLLED AND MOST LIKELY DUE TO LACTOSE INTOLERANCE/DIETARY CHOICES.  TO REDUCE GAS, BLOATING, AND REGURGITATION, ADD LACTASE 3 PILLS WITH MEALS THREE TO FOUR TIMES A DAY TO HELP DIGEST MAYONNAISE AND ICE CREAM. DRINK WATER TO KEEP YOUR URINE LIGHT YELLOW. AVOID ITEMS THAT CAUSE BLOATING & GAS.  HANDOUT GIVEN. FOLLOW UP IN 6 MOS.

## 2016-05-17 NOTE — Progress Notes (Signed)
CC'ED TO PCP 

## 2016-05-18 ENCOUNTER — Other Ambulatory Visit: Payer: Self-pay | Admitting: Gastroenterology

## 2016-05-31 ENCOUNTER — Ambulatory Visit: Payer: Medicare Other | Admitting: Cardiology

## 2016-06-24 ENCOUNTER — Encounter (HOSPITAL_COMMUNITY): Payer: Self-pay | Admitting: Emergency Medicine

## 2016-06-24 ENCOUNTER — Emergency Department (HOSPITAL_COMMUNITY): Payer: Medicare Other

## 2016-06-24 ENCOUNTER — Emergency Department (HOSPITAL_COMMUNITY)
Admission: EM | Admit: 2016-06-24 | Discharge: 2016-06-24 | Disposition: A | Discharge status: Home / Self Care | Payer: Medicare Other | Attending: Emergency Medicine | Admitting: Emergency Medicine

## 2016-06-24 DIAGNOSIS — S4991XA Unspecified injury of right shoulder and upper arm, initial encounter: Secondary | ICD-10-CM | POA: Diagnosis present

## 2016-06-24 DIAGNOSIS — Z7982 Long term (current) use of aspirin: Secondary | ICD-10-CM | POA: Insufficient documentation

## 2016-06-24 DIAGNOSIS — Y929 Unspecified place or not applicable: Secondary | ICD-10-CM | POA: Insufficient documentation

## 2016-06-24 DIAGNOSIS — I10 Essential (primary) hypertension: Secondary | ICD-10-CM | POA: Insufficient documentation

## 2016-06-24 DIAGNOSIS — Y9389 Activity, other specified: Secondary | ICD-10-CM | POA: Insufficient documentation

## 2016-06-24 DIAGNOSIS — E039 Hypothyroidism, unspecified: Secondary | ICD-10-CM | POA: Diagnosis not present

## 2016-06-24 DIAGNOSIS — X58XXXA Exposure to other specified factors, initial encounter: Secondary | ICD-10-CM | POA: Insufficient documentation

## 2016-06-24 DIAGNOSIS — J45909 Unspecified asthma, uncomplicated: Secondary | ICD-10-CM | POA: Insufficient documentation

## 2016-06-24 DIAGNOSIS — J449 Chronic obstructive pulmonary disease, unspecified: Secondary | ICD-10-CM | POA: Insufficient documentation

## 2016-06-24 DIAGNOSIS — S46911A Strain of unspecified muscle, fascia and tendon at shoulder and upper arm level, right arm, initial encounter: Secondary | ICD-10-CM

## 2016-06-24 DIAGNOSIS — Z8542 Personal history of malignant neoplasm of other parts of uterus: Secondary | ICD-10-CM | POA: Diagnosis not present

## 2016-06-24 DIAGNOSIS — Y999 Unspecified external cause status: Secondary | ICD-10-CM | POA: Diagnosis not present

## 2016-06-24 MED ORDER — DICLOFENAC SODIUM 1 % TD GEL: 2.0000 g | Freq: Four times a day (QID) | TRANSDERMAL | 0 refills | Status: DC

## 2016-06-24 MED ORDER — TRAMADOL HCL 50 MG PO TABS: 50.0000 mg | ORAL_TABLET | Freq: Four times a day (QID) | ORAL | 0 refills | Status: DC | PRN

## 2016-06-24 NOTE — Discharge Instructions (Signed)
Please follow-up with your primary care doctor for ongoing pain control.   Return for worsening symptoms, including fever, escalating pain, or any other symptoms concerning to you.

## 2016-06-24 NOTE — ED Triage Notes (Signed)
Pt states her right shoulder has been hurting since doing some cleaning before mother's day.  States it hurts worse to move it or to cough.  Did not take bp meds today.

## 2016-06-24 NOTE — ED Provider Notes (Signed)
[place] DEPT Provider Note   CSN: [id number] Arrival date & time: 06/24/16  0955     History   Chief Complaint Chief Complaint  Patient presents with  . Shoulder Pain    HPI Kelly Hebert is a 76 y.o. female.  The history is provided by the patient.  Shoulder Pain   This is a new problem. The current episode started more than 1 week ago. The problem occurs constantly. The problem has not changed since onset.The pain is present in the left shoulder. The quality of the pain is described as aching and intermittent. The pain is moderate. Associated symptoms include limited range of motion and stiffness. Pertinent negatives include no numbness and no tingling. The symptoms are aggravated by activity. She has tried OTC pain medications for the symptoms. The treatment provided mild relief. There has been no history of extremity trauma. Family history is significant for gout.   76 year old female who presents with right shoulder pain. History of previous surgery to that right shoulder. States that after Mother's Day, the patient was doing a lot of housework, including cleaning where she overused her right arm. Since then she has had pain in the right shoulder, worse with range of motion and movement. Has occasionally taken dose of Aleve, with mild improvement. No fall or trauma. No numbness or weakness. No chest pain or difficulty breathing.  Past Medical History:  Diagnosis Date  . Acid reflux   . Anxiety   . Cancer [facility])    endometrial cancer  . Constipation   . COPD (chronic obstructive pulmonary disease) ([place])   . Depression   . Gout   . Heart murmur   . Hx of pyloric stenosis 11/14/2011  . Hypercholesteremia   . Hyperglycemia   . Hypertension   . Hypothyroidism   . Renal disorder    pelvic kidney  . S/P colonoscopy 2003   Dr. Nicolasa Ducking: normal colon, normal path.   . Vitamin D deficiency disease     Patient Active Problem List   Diagnosis Date Noted  . Dyspepsia  05/17/2016  . Constipation 04/17/2012  . Dysuria 11/14/2011  . Asthma 01/07/2011  . Benign hypertension 01/07/2011  . Hyperlipidemia 01/07/2011  . Anxiety 01/07/2011  . Hypothyroidism 01/07/2011  . GERD (gastroesophageal reflux disease) 01/07/2011    Past Surgical History:  Procedure Laterality Date  . ABDOMINAL HYSTERECTOMY     complete  . BREAST SURGERY     breast biopsy   . COLONOSCOPY  03/09/2011   WUJ:WJXBJYN diverticula, small internal hemorrhoids.   . ESOPHAGOGASTRODUODENOSCOPY   03/09/2011   WGN:FAOZHYQMV in the distal esophagus/ Stenosis at the pylorus/ Mild gastritis/ Hiatal hernia. gastric bx showed gastritis but no H.pylori  . ESOPHAGOGASTRODUODENOSCOPY  03/20/11   SLF: stricture distal esophgus and stenosis at pyloris likely from NSAIDs. s/p dialtion.   . ESOPHAGOGASTRODUODENOSCOPY (EGD) WITH ESOPHAGEAL DILATION  11/23/2011   SLF: Stricture was found at the gastroesophageal junction/ Small hiatal hernia/  Sessile polyp was found in the gastric body and gastric fundus/ Stenosis was found at the pylorus/ The duodenal mucosa showed no abnormalities in the bulb and second portion of the duodenum  . ESOPHAGOGASTRODUODENOSCOPY (EGD) WITH ESOPHAGEAL DILATION  12/03/2011   HQI:ONGEX was found in the entire examined stomach/Stricture was found at the gastroesophageal junction  . LAPAROSCOPY  2000   [place]-stomach pain-pt reports negative  . ROTATOR CUFF REPAIR      OB History    No data available  Home Medications    Prior to Admission medications   Medication Sig Start Date End Date Taking? Authorizing Provider  albuterol (PROVENTIL HFA;VENTOLIN HFA) 108 (90 BASE) MCG/ACT inhaler Inhale 2 puffs into the lungs every 6 (six) hours as needed. Reported on 03/24/2015    [provider]  amitriptyline (ELAVIL) 10 MG tablet Take 10 mg by mouth at bedtime.  07/29/12   [provider]  amLODipine (NORVASC) 10 MG tablet Take 10 mg by mouth daily.     [provider]  aspirin EC 81 MG tablet Take 81 mg by mouth daily.    [provider]  atorvastatin (LIPITOR) 40 MG tablet Take 40 mg by mouth daily.  04/10/12   [provider]  benazepril-hydrochlorthiazide (LOTENSIN HCT) 20-12.5 MG per tablet Take 1 tablet by mouth Daily. 10/03/11   [provider]  Cholecalciferol (VITAMIN D-1000 MAX ST) 1000 units tablet Take 1,000 mg by mouth daily.    [provider]  citalopram (CELEXA) 40 MG tablet Take 40 mg by mouth daily.      [provider]  DEXILANT 60 MG capsule TAKE 1 CAPSULE BY MOUTH EVERY DAY 03/20/16   Mahala Menghini, PA-C  dicyclomine (BENTYL) 20 MG tablet Take 1 tablet (20 mg total) by mouth every 6 (six) hours as needed for spasms (abdominal cramping). Patient not taking: Reported on 05/17/2016 03/16/15   Francine Graven, DO  fluticasone (FLOVENT HFA) 110 MCG/ACT inhaler Inhale 1 puff into the lungs 2 (two) times daily as needed. Shortness of breath.    [provider]  hydrocortisone (ANUSOL-HC) 2.5 % rectal cream Place 1 application rectally 2 (two) times daily. Patient not taking: Reported on 03/16/2015 11/19/13   Mahala Menghini, PA-C  hydrocortisone (ANUSOL-HC) 25 MG suppository Place 1 suppository (25 mg total) rectally 2 (two) times daily. For 7 days Patient not taking: Reported on 03/16/2015 11/18/13   Nat Christen, MD  levothyroxine (SYNTHROID, LEVOTHROID) 50 MCG tablet Take 50 mcg by mouth daily.  04/10/12   [provider]  LINZESS 145 MCG CAPS capsule TAKE 1 CAPSULE BY MOUTH EVERY DAY 05/18/16   Annitta Needs, NP  LORazepam (ATIVAN) 1 MG tablet Take 1 mg by mouth 2 (two) times daily.     [provider]  ondansetron (ZOFRAN) 4 MG tablet Take 1 tablet (4 mg total) by mouth every 8 (eight) hours as needed for nausea or vomiting. Patient not taking: Reported on 03/24/2015 03/16/15   Francine Graven, DO  potassium chloride SA (K-DUR,KLOR-CON) 20 MEQ tablet Take  20 mEq by mouth daily. 03/08/15   [provider]  Vitamin D, Ergocalciferol, (DRISDOL) 50000 UNITS CAPS Take 50,000 Units by mouth every 14 (fourteen) days. Patient takes twice a month    [provider]    Family History Family History  Problem Relation Age of Onset  . Kidney disease Sister   . Cirrhosis Brother        ?etoh  . Diabetes Brother   . Heart attack Brother   . Heart disease Mother   . Heart attack Mother   . Heart disease Father   . Heart attack Father   . Diabetes Brother   . Cirrhosis Sister        Non-etoh  . Colon cancer Neg Hx     Social History Social History  Substance Use Topics  . Smoking status: Never Smoker  . Smokeless tobacco: Never Used  . Alcohol use No  Allergies   Ivp dye [iodinated diagnostic agents]; Calcium channel blockers; and Nifedipine   Review of Systems Review of Systems  Constitutional: Negative for fever.  Respiratory: Negative for shortness of breath.   Cardiovascular: Negative for chest pain.  Gastrointestinal: Negative for nausea and vomiting.  Musculoskeletal: Positive for stiffness.  Skin: Negative for wound.  Neurological: Negative for tingling and numbness.  All other systems reviewed and are negative.    Physical Exam Updated Vital Signs BP (!) 155/118 (BP Location: Left Arm)   Pulse 72   Temp 98.4 F (36.9 C) (Oral)   Resp 18   Ht 5\' 2"  (1.575 m)   Wt 66.7 kg (147 lb)   SpO2 99%   BMI 26.89 kg/m   Physical Exam Physical Exam  Nursing note and vitals reviewed. Constitutional: Well developed, well nourished, non-toxic, and in no acute distress Head: Normocephalic and atraumatic.  Mouth/Throat: Oropharynx is clear and moist.  Neck: Normal range of motion. Neck supple.  Cardiovascular: Normal rate and regular rhythm.   Pulmonary/Chest: Effort normal and breath sounds normal.  Abdominal: Soft. There is no tenderness. There is no rebound and no guarding.  Musculoskeletal: Pain with  adduction of the right shoulder. No deformities. Tenderness over right trapezius muscle.   Neurological: Alert, no facial droop, fluent speech, moves all extremities symmetrically, intact innervation of the axillary, median, ulnar, and radial nerves of bilateral upper extremities Skin: Skin is warm and dry.  Psychiatric: Cooperative   ED Treatments / Results  Labs (all labs ordered are listed, but only abnormal results are displayed) Labs Reviewed - No data to display  EKG  EKG Interpretation None       Radiology Dg Shoulder Right  Result Date: 06/24/2016 CLINICAL DATA:  Right shoulder pain. EXAM: RIGHT SHOULDER - 2+ VIEW COMPARISON:  05/30/2010 MR and prior studies FINDINGS: Resection of distal clavicle is noted. Mild glenohumeral joint degenerative changes noted. There is no evidence of acute fracture, subluxation or dislocation. No focal bony lesions are identified. IMPRESSION: Surgical and mild degenerative changes.  No acute abnormality. Electronically Signed   By: Margarette Canada M.D.   On: 06/24/2016 10:38    Procedures Procedures (including critical care time)  Medications Ordered in ED Medications - No data to display   Initial Impression / Assessment and Plan / ED Course  I have reviewed the triage vital signs and the nursing notes.  Pertinent labs & imaging results that were available during my care of the patient were reviewed by me and considered in my medical decision making (see chart for details).     Presenting with right shoulder pain after overuse injury. Pain reproduced with palpation of trapezius muscles on right and with full adduction of shoulder when she brings arm to the opposite shoulder. Seems consistent with MSK pain. No overlying skin changes or infectious symptoms. XR with mild arthritis and post-surgical changes. Will continue supportive care management for MSK pain. Will follow-up closely with PCP for ongoing pain control.   Final Clinical  Impressions(s) / ED Diagnoses   Final diagnoses:  Strain of right shoulder, initial encounter    New Prescriptions New Prescriptions   No medications on file     Forde Dandy, MD 06/24/16 1237

## 2016-07-04 ENCOUNTER — Encounter: Payer: Self-pay | Admitting: Cardiology

## 2016-07-04 ENCOUNTER — Ambulatory Visit (INDEPENDENT_AMBULATORY_CARE_PROVIDER_SITE_OTHER): Payer: Medicare Other | Admitting: Cardiology

## 2016-07-04 VITALS — BP 156/74 | HR 99 | Ht 62.0 in | Wt 147.0 lb

## 2016-07-04 DIAGNOSIS — I10 Essential (primary) hypertension: Secondary | ICD-10-CM | POA: Diagnosis not present

## 2016-07-04 DIAGNOSIS — E782 Mixed hyperlipidemia: Secondary | ICD-10-CM

## 2016-07-04 DIAGNOSIS — R011 Cardiac murmur, unspecified: Secondary | ICD-10-CM | POA: Diagnosis not present

## 2016-07-04 DIAGNOSIS — I6523 Occlusion and stenosis of bilateral carotid arteries: Secondary | ICD-10-CM

## 2016-07-04 DIAGNOSIS — R0789 Other chest pain: Secondary | ICD-10-CM | POA: Diagnosis not present

## 2016-07-04 NOTE — Progress Notes (Signed)
Clinical Summary Kelly Hebert is a 76 y.o.female seen as new patient, referred by Dr Sonda Rumble for carotid stenosis.   1. Carotid stenosis - 03/2016 carotid US with LICA 35-36%, large plaque RICA without significant stenosis - 04/2016 CTA neck bulkly plaque LICA 14% stenosis, RICA 35%  - denies any neuro symptoms.    2. Chest pain - occasional left sided, pinching pain. Tends to occur with activity. No other associated symptoms - lasts just a few seconds. Occurs about every other month - highest level of activity is housework which she tolerates   3. HTN - has not taken meds yet today.  4. Hyperlipidemia - compliant with statin.     Past Medical History:  Diagnosis Date  . Acid reflux   . Anxiety   . Cancer [facility])    endometrial cancer  . Constipation   . COPD (chronic obstructive pulmonary disease) ([place])   . Depression   . Gout   . Heart murmur   . Hx of pyloric stenosis 11/14/2011  . Hypercholesteremia   . Hyperglycemia   . Hypertension   . Hypothyroidism   . Renal disorder    pelvic kidney  . S/P colonoscopy 2003   Dr. Nicolasa Ducking: normal colon, normal path.   . Vitamin D deficiency disease      Allergies  Allergen Reactions  . Ivp Dye [Iodinated Diagnostic Agents] Hives and Itching    Pt. States this happened 4 or 5 years ago in [place].    . Calcium Channel Blockers   . Nifedipine      Current Outpatient Prescriptions  Medication Sig Dispense Refill  . albuterol (PROVENTIL HFA;VENTOLIN HFA) 108 (90 BASE) MCG/ACT inhaler Inhale 2 puffs into the lungs every 6 (six) hours as needed. Reported on 03/24/2015    . amitriptyline (ELAVIL) 10 MG tablet Take 10 mg by mouth at bedtime.     Marland Kitchen amLODipine (NORVASC) 10 MG tablet Take 10 mg by mouth daily.    Marland Kitchen aspirin EC 81 MG tablet Take 81 mg by mouth daily.    Marland Kitchen atorvastatin (LIPITOR) 40 MG tablet Take 40 mg by mouth daily.     . benazepril-hydrochlorthiazide (LOTENSIN HCT) 20-12.5 MG per tablet Take 1 tablet by  mouth Daily.    . Cholecalciferol (VITAMIN D-1000 MAX ST) 1000 units tablet Take 1,000 mg by mouth daily.    . citalopram (CELEXA) 40 MG tablet Take 40 mg by mouth daily.      Marland Kitchen DEXILANT 60 MG capsule TAKE 1 CAPSULE BY MOUTH EVERY DAY 30 capsule 5  . diclofenac sodium (VOLTAREN) 1 % GEL Apply 2 g topically 4 (four) times daily. 100 g 0  . dicyclomine (BENTYL) 20 MG tablet Take 1 tablet (20 mg total) by mouth every 6 (six) hours as needed for spasms (abdominal cramping). (Patient not taking: Reported on 05/17/2016) 15 tablet 0  . fluticasone (FLOVENT HFA) 110 MCG/ACT inhaler Inhale 1 puff into the lungs 2 (two) times daily as needed. Shortness of breath.    . hydrocortisone (ANUSOL-HC) 2.5 % rectal cream Place 1 application rectally 2 (two) times daily. (Patient not taking: Reported on 03/16/2015) 30 g 0  . hydrocortisone (ANUSOL-HC) 25 MG suppository Place 1 suppository (25 mg total) rectally 2 (two) times daily. For 7 days (Patient not taking: Reported on 03/16/2015) 20 suppository 1  . levothyroxine (SYNTHROID, LEVOTHROID) 50 MCG tablet Take 50 mcg by mouth daily.     Marland Kitchen LINZESS 145 MCG CAPS capsule TAKE 1 CAPSULE  BY MOUTH EVERY DAY 90 capsule 3  . LORazepam (ATIVAN) 1 MG tablet Take 1 mg by mouth 2 (two) times daily.     . ondansetron (ZOFRAN) 4 MG tablet Take 1 tablet (4 mg total) by mouth every 8 (eight) hours as needed for nausea or vomiting. (Patient not taking: Reported on 03/24/2015) 6 tablet 0  . potassium chloride SA (K-DUR,KLOR-CON) 20 MEQ tablet Take 20 mEq by mouth daily.  2  . traMADol (ULTRAM) 50 MG tablet Take 1 tablet (50 mg total) by mouth every 6 (six) hours as needed for severe pain. 10 tablet 0  . Vitamin D, Ergocalciferol, (DRISDOL) 50000 UNITS CAPS Take 50,000 Units by mouth every 14 (fourteen) days. Patient takes twice a month     No current facility-administered medications for this visit.      Past Surgical History:  Procedure Laterality Date  . ABDOMINAL HYSTERECTOMY      complete  . BREAST SURGERY     breast biopsy   . COLONOSCOPY  03/09/2011   HAL:[id number] diverticula, small internal hemorrhoids.   . ESOPHAGOGASTRODUODENOSCOPY   03/09/2011   OXB:[id number] in the distal esophagus/ Stenosis at the pylorus/ Mild gastritis/ Hiatal hernia. gastric bx showed gastritis but no H.pylori  . ESOPHAGOGASTRODUODENOSCOPY  03/20/11   SLF: stricture distal esophgus and stenosis at pyloris likely from NSAIDs. s/p dialtion.   . ESOPHAGOGASTRODUODENOSCOPY (EGD) WITH ESOPHAGEAL DILATION  11/23/2011   SLF: Stricture was found at the gastroesophageal junction/ Small hiatal hernia/  Sessile polyp was found in the gastric body and gastric fundus/ Stenosis was found at the pylorus/ The duodenal mucosa showed no abnormalities in the bulb and second portion of the duodenum  . ESOPHAGOGASTRODUODENOSCOPY (EGD) WITH ESOPHAGEAL DILATION  12/03/2011   AST:MHDQQ was found in the entire examined stomach/Stricture was found at the gastroesophageal junction  . LAPAROSCOPY  2000   [place]-stomach pain-pt reports negative  . ROTATOR CUFF REPAIR       Allergies  Allergen Reactions  . Ivp Dye [Iodinated Diagnostic Agents] Hives and Itching    Pt. States this happened 4 or 5 years ago in [place].    . Calcium Channel Blockers   . Nifedipine       Family History  Problem Relation Age of Onset  . Kidney disease Sister   . Cirrhosis Brother        ?etoh  . Diabetes Brother   . Heart attack Brother   . Heart disease Mother   . Heart attack Mother   . Heart disease Father   . Heart attack Father   . Diabetes Brother   . Cirrhosis Sister        Non-etoh  . Colon cancer Neg Hx      Social History Ms. Wadley reports that she has never smoked. She has never used smokeless tobacco. Ms. Holloran reports that she does not drink alcohol.   Review of Systems CONSTITUTIONAL: No weight loss, fever, chills, weakness or fatigue.  HEENT: Eyes: No visual loss, blurred vision, double  vision or yellow sclerae.No hearing loss, sneezing, congestion, runny nose or sore throat.  SKIN: No rash or itching.  CARDIOVASCULAR: per hpi RESPIRATORY: No shortness of breath, cough or sputum.  GASTROINTESTINAL: No anorexia, nausea, vomiting or diarrhea. No abdominal pain or blood.  GENITOURINARY: No burning on urination, no polyuria NEUROLOGICAL: No headache, dizziness, syncope, paralysis, ataxia, numbness or tingling in the extremities. No change in bowel or bladder control.  MUSCULOSKELETAL: No muscle, back pain, joint pain or stiffness.  LYMPHATICS: No enlarged nodes. No history of splenectomy.  PSYCHIATRIC: No history of depression or anxiety.  ENDOCRINOLOGIC: No reports of sweating, cold or heat intolerance. No polyuria or polydipsia.  Marland Kitchen   Physical Examination Vitals:   07/04/16 0921 07/04/16 0927  BP: (!) 154/60 (!) 156/74  Pulse: 99    Vitals:   07/04/16 0921  Weight: 147 lb (66.7 kg)  Height: 5\' 2"  (1.575 m)    Gen: resting comfortably, no acute distress HEENT: no scleral icterus, pupils equal round and reactive, no palptable cervical adenopathy,  CV: JTT,0/1 systolic murmur rusb, no jvd.  Resp: Clear to auscultation bilaterally GI: abdomen is soft, non-tender, non-distended, normal bowel sounds, no hepatosplenomegaly MSK: extremities are warm, no edema.  Skin: warm, no rash Neuro:  no focal deficits Psych: appropriate affect      Assessment and Plan  1. Carotid stenosis - moderate left and mild right ICA stenosis - no significant symptoms - continue to monitor at this time. Continue risk factor modication  2.Chest pain - atypical, continue to monitor at this time  3. HTN - elevated in clinic, has not taken meds yet today - continue to monitor  4. Hyperlipidemia - request labs from pcp  5. Heart murmur - obtain echo    F/u 1 year      Arnoldo Lenis, M.D.

## 2016-07-04 NOTE — Patient Instructions (Signed)
Medication Instructions:  Your physician recommends that you continue on your current medications as directed. Please refer to the Current Medication list given to you today.   Labwork: I WILL REQUEST A COPY OF RECENT LABS FROM PCP  Testing/Procedures: Your physician has requested that you have an echocardiogram. Echocardiography is a painless test that uses sound waves to create images of your heart. It provides your doctor with information about the size and shape of your heart and how well your heart's chambers and valves are working. This procedure takes approximately one hour. There are no restrictions for this procedure.    Follow-Up: Your physician wants you to follow-up in: 1 YEAR.  You will receive a reminder letter in the mail two months in advance. If you don't receive a letter, please call our office to schedule the follow-up appointment.   Any Other Special Instructions Will Be Listed Below (If Applicable).     If you need a refill on your cardiac medications before your next appointment, please call your pharmacy.

## 2016-07-10 ENCOUNTER — Ambulatory Visit (HOSPITAL_COMMUNITY)
Admission: RE | Admit: 2016-07-10 | Discharge: 2016-07-10 | Disposition: A | Discharge status: Home / Self Care | Payer: Medicare Other | Source: Ambulatory Visit | Attending: Cardiology | Admitting: Cardiology

## 2016-07-10 DIAGNOSIS — R011 Cardiac murmur, unspecified: Secondary | ICD-10-CM

## 2016-07-10 LAB — ECHOCARDIOGRAM COMPLETE
AO mean calculated velocity dopler: 110 cm/s
AOVTI: 35.5 cm
AV Area mean vel: 1.71 cm2
AV VEL mean LVOT/AV: 0.85
AV area mean vel ind: 0.99 cm2/m2
AV vel: 1.78
AVA: 1.78 cm2
AVAREAVTI: 1.61 cm2
AVAREAVTIIND: 1.03 cm2/m2
AVG: 6 mmHg
AVPG: 14 mmHg
AVPKVEL: 189 cm/s
Ao pk vel: 0.8 m/s
CHL CUP AV PEAK INDEX: 0.93
CHL CUP MV DEC (S): 257
CHL CUP RV SYS PRESS: 29 mmHg
CHL CUP STROKE VOLUME: 25 mL
E decel time: 257 msec
E/e' ratio: 11.75
FS: 42 % (ref 28–44)
IV/PV OW: 1.01
LA ID, A-P, ES: 35 mm
LA diam end sys: 35 mm
LA vol A4C: 34.3 ml
LA vol: 35.1 mL
LADIAMINDEX: 2.03 cm/m2
LAVOLIN: 20.3 mL/m2
LDCA: 2.01 cm2
LV PW d: 10.2 mm — AB (ref 0.6–1.1)
LV SIMPSON'S DISK: 61
LV TDI E'LATERAL: 9.36
LV TDI E'MEDIAL: 6.42
LV dias vol: 40 mL — AB (ref 46–106)
LV e' LATERAL: 9.36 cm/s
LV sys vol index: 9 mL/m2
LV sys vol: 16 mL
LVDIAVOLIN: 23 mL/m2
LVEEAVG: 11.75
LVEEMED: 11.75
LVOT SV: 63 mL
LVOT VTI: 31.5 cm
LVOT peak VTI: 0.89 cm
LVOT peak grad rest: 9 mmHg
LVOT peak vel: 151 cm/s
LVOTD: 16 mm
Lateral S' vel: 12.9 cm/s
MV pk A vel: 138 m/s
MVPG: 5 mmHg
MVPKEVEL: 110 m/s
Reg peak vel: 254 cm/s
TAPSE: 22.4 mm
TR max vel: 254 cm/s
Valve area index: 1.03

## 2016-07-10 NOTE — Progress Notes (Signed)
*  PRELIMINARY RESULTS* Echocardiogram 2D Echocardiogram has been performed.  Kelly Hebert 07/10/2016, 9:49 AM

## 2016-07-11 ENCOUNTER — Telehealth: Payer: Self-pay | Admitting: *Deleted

## 2016-07-11 NOTE — Telephone Encounter (Signed)
Patient informed. 

## 2016-07-11 NOTE — Telephone Encounter (Signed)
-----   Message from Arnoldo Lenis, MD sent at 07/11/2016  2:42 PM EDT ----- Echo looks good, normal heart functon and normal heart valves  Zandra Abts MD

## 2016-07-15 ENCOUNTER — Emergency Department (HOSPITAL_COMMUNITY): Payer: Medicare Other

## 2016-07-15 ENCOUNTER — Encounter (HOSPITAL_COMMUNITY): Payer: Self-pay | Admitting: Emergency Medicine

## 2016-07-15 ENCOUNTER — Emergency Department (HOSPITAL_COMMUNITY)
Admission: EM | Admit: 2016-07-15 | Discharge: 2016-07-15 | Disposition: A | Discharge status: Home Health | Payer: Medicare Other | Attending: Emergency Medicine | Admitting: Emergency Medicine

## 2016-07-15 DIAGNOSIS — Z7982 Long term (current) use of aspirin: Secondary | ICD-10-CM | POA: Insufficient documentation

## 2016-07-15 DIAGNOSIS — E039 Hypothyroidism, unspecified: Secondary | ICD-10-CM | POA: Diagnosis not present

## 2016-07-15 DIAGNOSIS — M5412 Radiculopathy, cervical region: Secondary | ICD-10-CM | POA: Diagnosis not present

## 2016-07-15 DIAGNOSIS — I10 Essential (primary) hypertension: Secondary | ICD-10-CM | POA: Diagnosis not present

## 2016-07-15 DIAGNOSIS — J45909 Unspecified asthma, uncomplicated: Secondary | ICD-10-CM | POA: Diagnosis not present

## 2016-07-15 DIAGNOSIS — J449 Chronic obstructive pulmonary disease, unspecified: Secondary | ICD-10-CM | POA: Diagnosis not present

## 2016-07-15 DIAGNOSIS — M542 Cervicalgia: Secondary | ICD-10-CM | POA: Diagnosis present

## 2016-07-15 MED ORDER — METHYLPREDNISOLONE 4 MG PO TBPK: ORAL_TABLET | ORAL | 0 refills | Status: DC

## 2016-07-15 MED ORDER — KETOROLAC TROMETHAMINE 30 MG/ML IJ SOLN
30.0000 mg | Freq: Once | INTRAMUSCULAR | Status: AC
  Administered 2016-07-15: 30 mg via INTRAMUSCULAR
  Filled 2016-07-15: qty 1

## 2016-07-15 NOTE — ED Triage Notes (Addendum)
Patient c/o right arm pain and tingling that started 3 weeks ago. Patient reports being seen here in ER and told muscle strain, given tramadol with no relief. Patient states followed-up with DR Harl Bowie to check heart but states heart was normal. Per patient hurts when turning neck to the right and tender with palpitation. Radial pulse present. Denies any swelling.

## 2016-07-15 NOTE — Discharge Instructions (Signed)
Alternate ice and heat to your shoulder and neck.  Take the medication as directed.  Call your primary doctor to arrange a follow-up appt.

## 2016-07-17 NOTE — ED Provider Notes (Signed)
[facility] DEPT Provider Note   CSN: [id number] Arrival date & time: 07/15/16  1049     History   Chief Complaint Chief Complaint  Patient presents with  . Arm Pain    HPI Kelly Hebert is a 76 y.o. female.  HPI  Kelly Hebert is a 76 y.o. female who presents to the Emergency Department complaining of right arm, shoulder and neck pain that began 3 weeks ago.  She was seen here for same and also followed up with her cardiologist and told it was not cardiac related.  Pain improved slightly after previous treatment, but not resolved.  Pain wore with movement of the neck and palpation of the posterior right shoulder.  She denies swelling, weakness of the extremity, headaches, dizziness.  Past Medical History:  Diagnosis Date  . Acid reflux   . Anxiety   . Cancer [facility])    endometrial cancer  . Constipation   . COPD (chronic obstructive pulmonary disease) ([place])   . Depression   . Gout   . Heart murmur   . Hx of pyloric stenosis 11/14/2011  . Hypercholesteremia   . Hyperglycemia   . Hypertension   . Hypothyroidism   . Renal disorder    pelvic kidney  . S/P colonoscopy 2003   Dr. Nicolasa Ducking: normal colon, normal path.   . Vitamin D deficiency disease     Patient Active Problem List   Diagnosis Date Noted  . Dyspepsia 05/17/2016  . Constipation 04/17/2012  . Dysuria 11/14/2011  . Asthma 01/07/2011  . Benign hypertension 01/07/2011  . Hyperlipidemia 01/07/2011  . Anxiety 01/07/2011  . Hypothyroidism 01/07/2011  . GERD (gastroesophageal reflux disease) 01/07/2011    Past Surgical History:  Procedure Laterality Date  . ABDOMINAL HYSTERECTOMY     complete  . BREAST SURGERY     breast biopsy   . COLONOSCOPY  03/09/2011   UXN:ATFTDDU diverticula, small internal hemorrhoids.   . ESOPHAGOGASTRODUODENOSCOPY   03/09/2011   KGU:[id number] in the distal esophagus/ Stenosis at the pylorus/ Mild gastritis/ Hiatal hernia. gastric bx showed gastritis but no H.pylori  .  ESOPHAGOGASTRODUODENOSCOPY  03/20/11   SLF: stricture distal esophgus and stenosis at pyloris likely from NSAIDs. s/p dialtion.   . ESOPHAGOGASTRODUODENOSCOPY (EGD) WITH ESOPHAGEAL DILATION  11/23/2011   SLF: Stricture was found at the gastroesophageal junction/ Small hiatal hernia/  Sessile polyp was found in the gastric body and gastric fundus/ Stenosis was found at the pylorus/ The duodenal mucosa showed no abnormalities in the bulb and second portion of the duodenum  . ESOPHAGOGASTRODUODENOSCOPY (EGD) WITH ESOPHAGEAL DILATION  12/03/2011   SEG:BTDVV was found in the entire examined stomach/Stricture was found at the gastroesophageal junction  . LAPAROSCOPY  2000   [place]-stomach pain-pt reports negative  . ROTATOR CUFF REPAIR      OB History    Gravida Para Term Preterm AB Living   3 3 3     3    SAB TAB Ectopic Multiple Live Births                   Home Medications    Prior to Admission medications   Medication Sig Start Date End Date Taking? Authorizing Provider  albuterol (PROVENTIL HFA;VENTOLIN HFA) 108 (90 BASE) MCG/ACT inhaler Inhale 2 puffs into the lungs every 6 (six) hours as needed. Reported on 03/24/2015    [provider]  amitriptyline (ELAVIL) 10 MG tablet Take 10 mg by mouth at bedtime.  07/29/12   [provider]  amLODipine (NORVASC) 10 MG tablet Take 10 mg by mouth daily.    [provider]  aspirin EC 81 MG tablet Take 81 mg by mouth daily.    [provider]  atorvastatin (LIPITOR) 40 MG tablet Take 40 mg by mouth daily.  04/10/12   [provider]  benazepril-hydrochlorthiazide (LOTENSIN HCT) 20-12.5 MG per tablet Take 1 tablet by mouth Daily. 10/03/11   [provider]  Cholecalciferol (VITAMIN D-1000 MAX ST) 1000 units tablet Take 1,000 mg by mouth daily.    [provider]  citalopram (CELEXA) 40 MG tablet Take 40 mg by mouth daily.      [provider]  DEXILANT 60 MG capsule TAKE 1  CAPSULE BY MOUTH EVERY DAY 03/20/16   Mahala Menghini, PA-C  diclofenac sodium (VOLTAREN) 1 % GEL Apply 2 g topically 4 (four) times daily. 06/24/16   Forde Dandy, MD  fluticasone (FLOVENT HFA) 110 MCG/ACT inhaler Inhale 1 puff into the lungs 2 (two) times daily as needed. Shortness of breath.    [provider]  levothyroxine (SYNTHROID, LEVOTHROID) 50 MCG tablet Take 50 mcg by mouth daily.  04/10/12   [provider]  LINZESS 145 MCG CAPS capsule TAKE 1 CAPSULE BY MOUTH EVERY DAY 05/18/16   Annitta Needs, NP  LORazepam (ATIVAN) 1 MG tablet Take 1 mg by mouth 2 (two) times daily.     [provider]  methylPREDNISolone (MEDROL DOSEPAK) 4 MG TBPK tablet Day 1: Two tablets before breakfast, one after lunch, one after dinner, and two at bedtime.  Day 2: One tablet before breakfast, one after lunch, one after dinner, and two at bedtime Day 3: One tablet before breakfast, one after lunch, one after dinner, and one at bedtime Day 4: One tablet before breakfast, one after lunch, and one at bedtime Day 5: One tablet before breakfast and one at bedtime Day 6: One tablet before breakfast 07/15/16   Miller Edgington, PA-C  traMADol (ULTRAM) 50 MG tablet Take 1 tablet (50 mg total) by mouth every 6 (six) hours as needed for severe pain. 06/24/16   Forde Dandy, MD    Family History Family History  Problem Relation Age of Onset  . Kidney disease Sister   . Cirrhosis Brother        ?etoh  . Diabetes Brother   . Heart attack Brother   . Heart disease Mother   . Heart attack Mother   . Heart disease Father   . Heart attack Father   . Diabetes Brother   . Cirrhosis Sister        Non-etoh  . Colon cancer Neg Hx     Social History Social History  Substance Use Topics  . Smoking status: Never Smoker  . Smokeless tobacco: Never Used  . Alcohol use No     Allergies   Ivp dye [iodinated diagnostic agents]; Calcium channel blockers; and Nifedipine   Review of  Systems Review of Systems  Constitutional: Negative for chills and fever.  Eyes: Negative for visual disturbance.  Respiratory: Negative for chest tightness and shortness of breath.   Gastrointestinal: Negative for nausea and vomiting.  Musculoskeletal: Positive for arthralgias (right shoulder pain) and neck pain. Negative for back pain and joint swelling.  Skin: Negative for color change and wound.  Neurological: Negative for dizziness, weakness, numbness and headaches.  All other systems reviewed and are negative.    Physical Exam Updated Vital Signs BP 127/64 (BP Location: Left  Arm)   Pulse 75   Temp 98.3 F (36.8 C) (Oral)   Resp 20   Ht 5\' 2"  (1.575 m)   Wt 66.7 kg (147 lb)   SpO2 98%   BMI 26.89 kg/m   Physical Exam  Constitutional: She is oriented to person, place, and time. She appears well-developed and well-nourished. No distress.  HENT:  Head: Atraumatic.  Mouth/Throat: Oropharynx is clear and moist.  Eyes: EOM are normal. Pupils are equal, round, and reactive to light.  Neck: Normal range of motion.  Cardiovascular: Normal rate, regular rhythm and intact distal pulses.   Pulmonary/Chest: Effort normal and breath sounds normal. No respiratory distress.  Musculoskeletal: She exhibits tenderness. She exhibits no edema.       Cervical back: She exhibits tenderness.       Back:  ttp of the right SCM muscle and trapiezus muscle.   Neurological: She is alert and oriented to person, place, and time. She has normal strength. No sensory deficit.  CN II - XII intact  Skin: Skin is warm. Capillary refill takes less than 2 seconds. No rash noted. No erythema.  Psychiatric: She has a normal mood and affect.  Nursing note and vitals reviewed.    ED Treatments / Results  Labs (all labs ordered are listed, but only abnormal results are displayed) Labs Reviewed - No data to display  EKG  EKG Interpretation None       Radiology Dg Shoulder Right  Result Date:  06/24/2016 CLINICAL DATA:  Right shoulder pain. EXAM: RIGHT SHOULDER - 2+ VIEW COMPARISON:  05/30/2010 MR and prior studies FINDINGS: Resection of distal clavicle is noted. Mild glenohumeral joint degenerative changes noted. There is no evidence of acute fracture, subluxation or dislocation. No focal bony lesions are identified. IMPRESSION: Surgical and mild degenerative changes.  No acute abnormality. Electronically Signed   By: Margarette Canada M.D.   On: 06/24/2016 10:38   Ct Cervical Spine Wo Contrast  Result Date: 07/15/2016 CLINICAL DATA:  Right shoulder, neck, and arm pain. EXAM: CT CERVICAL SPINE WITHOUT CONTRAST TECHNIQUE: Multidetector CT imaging of the cervical spine was performed without intravenous contrast. Multiplanar CT image reconstructions were also generated. COMPARISON:  Neck CTA 05/08/2016.  Cervical spine MRI 08/04/2009. FINDINGS: Alignment: Straightening/slight reversal the normal cervical lordosis. No listhesis. Skull base and vertebrae: No acute fracture or destructive osseous process. Soft tissues and spinal canal: No prevertebral fluid or swelling. Disc levels: C2-3:  Negative. C3-4:  Negative. C4-5: Mild-to-moderate disc space narrowing. Broad posterior disc protrusion the results in likely moderate spinal stenosis, similar to the prior MRI. Uncovertebral spurring and mild left facet arthrosis result in similar mild left neural foraminal stenosis. C5-6: Mild disc space narrowing. Broad posterior disc protrusion results in likely moderate spinal stenosis, similar to the prior MRI. No evidence of significant neural foraminal stenosis. C6-7:  Negative. C7-T1:  Negative. Upper chest: Mild scarring in the lung apices. Other: Calcification in the proximal cervical esophagus, unchanged. Bulky calcified plaque in the carotid arteries the, evaluated in detail on the prior CTA. IMPRESSION: 1. Similar appearance of C4-5 and C5-6 disc degeneration compared to the prior MRI with resultant moderate  spinal stenosis. 2. No acute osseous abnormality. Electronically Signed   By: Logan Bores M.D.   On: 07/15/2016 13:11     Procedures Procedures (including critical care time)  Medications Ordered in ED Medications  ketorolac (TORADOL) 30 MG/ML injection 30 mg (30 mg Intramuscular Given 07/15/16 1336)     Initial Impression /  Assessment and Plan / ED Course  I have reviewed the triage vital signs and the nursing notes.  Pertinent labs & imaging results that were available during my care of the patient were reviewed by me and considered in my medical decision making (see chart for details).     Pt feeling better after medication.  Also seen by Dr. Lacinda Axon and care plan discussed.  Remains NV intact.  No concerning sx's for emergent neurological process.  Appears stable for d/c.  Agrees to PCP f/u.    Final Clinical Impressions(s) / ED Diagnoses   Final diagnoses:  Cervical radicular pain    New Prescriptions Discharge Medication List as of 07/15/2016  1:39 PM    START taking these medications   Details  methylPREDNISolone (MEDROL DOSEPAK) 4 MG TBPK tablet Day 1: Two tablets before breakfast, one after lunch, one after dinner, and two at bedtime.  Day 2: One tablet before breakfast, one after lunch, one after dinner, and two at bedtime Day 3: One tablet before breakfast, one after lunch, one after dinner , and one at bedtime Day 4: One tablet before breakfast, one after lunch, and one at bedtime Day 5: One tablet before breakfast and one at bedtime Day 6: One tablet before breakfast, Print         Kem Parkinson, PA-C 07/17/16 2115    Nat Christen, MD 07/23/16 1414

## 2016-08-28 ENCOUNTER — Other Ambulatory Visit (HOSPITAL_COMMUNITY): Payer: Self-pay | Admitting: Neurosurgery

## 2016-08-28 DIAGNOSIS — M542 Cervicalgia: Secondary | ICD-10-CM

## 2016-09-04 ENCOUNTER — Ambulatory Visit (HOSPITAL_COMMUNITY)
Admission: RE | Admit: 2016-09-04 | Discharge: 2016-09-04 | Disposition: A | Discharge status: Home / Self Care | Payer: Medicare Other | Source: Ambulatory Visit | Attending: Neurosurgery | Admitting: Neurosurgery

## 2016-09-04 DIAGNOSIS — M542 Cervicalgia: Secondary | ICD-10-CM | POA: Diagnosis present

## 2016-09-04 DIAGNOSIS — M50222 Other cervical disc displacement at C5-C6 level: Secondary | ICD-10-CM | POA: Diagnosis not present

## 2016-09-04 DIAGNOSIS — M2578 Osteophyte, vertebrae: Secondary | ICD-10-CM | POA: Diagnosis not present

## 2016-09-04 DIAGNOSIS — M4802 Spinal stenosis, cervical region: Secondary | ICD-10-CM | POA: Insufficient documentation

## 2016-10-02 ENCOUNTER — Encounter: Payer: Self-pay | Admitting: Gastroenterology

## 2016-11-27 ENCOUNTER — Telehealth: Payer: Self-pay | Admitting: Gastroenterology

## 2016-11-27 NOTE — Telephone Encounter (Signed)
Kelly Hebert is aware pt is on Recall for 2023, and if pt has any GI problems before then or family hx of colon cancer, she should let us know.

## 2016-11-27 NOTE — Telephone Encounter (Signed)
Daisy from [facility] called to ask when patient's next colonoscopy is due. Please advise.

## 2017-01-10 ENCOUNTER — Encounter: Payer: Self-pay | Admitting: *Deleted

## 2017-01-10 ENCOUNTER — Encounter: Payer: Self-pay | Admitting: Gastroenterology

## 2017-01-10 ENCOUNTER — Other Ambulatory Visit: Payer: Self-pay | Admitting: *Deleted

## 2017-01-10 ENCOUNTER — Ambulatory Visit (INDEPENDENT_AMBULATORY_CARE_PROVIDER_SITE_OTHER): Payer: Medicare Other | Admitting: Gastroenterology

## 2017-01-10 DIAGNOSIS — K5901 Slow transit constipation: Secondary | ICD-10-CM

## 2017-01-10 DIAGNOSIS — R1310 Dysphagia, unspecified: Secondary | ICD-10-CM

## 2017-01-10 DIAGNOSIS — K219 Gastro-esophageal reflux disease without esophagitis: Secondary | ICD-10-CM | POA: Diagnosis not present

## 2017-01-10 DIAGNOSIS — R1013 Epigastric pain: Secondary | ICD-10-CM

## 2017-01-10 DIAGNOSIS — R6881 Early satiety: Secondary | ICD-10-CM

## 2017-01-10 NOTE — Assessment & Plan Note (Signed)
SYMPTOMS NOT IDEALLY CONTROLLED. DIFFERENTIAL DIAGNOSIS INCLUDES: PEPTIC STRICTURE, PRIMARY ESOPHAGEAL MOTILITY DISORDER,OR 2o ESOPHAGEAL MOTILITY DISORDER DUE TO UNCONTROLLED REFLUX, LESS LIKLEY ESOPHAGEAL CANCER.   CONTINUE DEXILANT. EGD/DIL IN 2-3 WEEKS.

## 2017-01-10 NOTE — Progress Notes (Signed)
CC'D TO PCP °

## 2017-01-10 NOTE — Assessment & Plan Note (Addendum)
LIKELY DUE TO STEROID INDUCED GASTRITIS. DIFFERENTIAL DIAGNOSIS INCLUDES:PYLORIC STENOSIS.  COMPLETE UPPER ENDOSCOPY TO STRETCH YOUR ESOPHAGUS AND PYLORUS. DISCUSSED PROCEDURE, BENEFITS, & RISKS: < 1% chance of medication reaction, bleeding, OR perforation. ZOFRAN 4 MG IV IN PREOP. CONTINUE DEXILANT. FOLLOW UP IN 4 MOS.

## 2017-01-10 NOTE — Assessment & Plan Note (Signed)
SYMPTOMS CONTROLLED/RESOLVED.  CONTINUE DEXILANT. FOLLOW UP IN 4 MOS.

## 2017-01-10 NOTE — H&P (View-Only) (Signed)
Subjective:    Patient ID: Kelly Hebert, female    DOB: [birth year], 76 y.o.   MRN: [id number]  Kelly Rival, NP   HPI Still c/o PAIN IN LUQ PAIN. LAST STEROIDS COT 2018. LUQ PAIN BEEN WORSE IT WAS REAL BAD. JUST STARTED IN DEC. [place] AND STOPPED RIGHT THERE AND THEN ACHY(FEW SECS). AFTER EATING BRUNSWICK STEW AND MILK LACTOSE FREE. TAKING DEXILANT EVERY DAY.  LINZESS WORKS. LINZESS CAUSED DIARRHEA LONG WHILE AGO. BMs: EVERY DAY. NAUSEATED OFF AND ON(VERY OFTEN AFTER SHE EATS). BLOATING: SEVERE-WITH NAUSEA. HARD TIME SWALLOWING LARGE PILL. FEELS THEM IN HER ESOPHAGUS. TAKES PILLS ONE AT A TIME. NO TROUBLE SWALLOWING SOLIDS. FEELS LIKE SHE GETS FULL FAST AGAIN. LAST EGD/DIL-ESO/PYLORUS 2013.  PT DENIES FEVER, CHILLS, DYSURIA, HEMATURIA.HEMATOCHEZIA, HEMATEMESIS, vomiting, melena, diarrhea, CHEST PAIN, SHORTNESS OF BREATH, CHANGE IN BOWEL IN HABITS, constipation, problems swallowing, OR heartburn or indigestion.  Past Medical History:  Diagnosis Date  . Acid reflux   . Anxiety   . Cancer [facility])    endometrial cancer  . Constipation   . COPD (chronic obstructive pulmonary disease) ([place])   . Depression   . Gout   . Heart murmur   . Hx of pyloric stenosis 11/14/2011  . Hypercholesteremia   . Hyperglycemia   . Hypertension   . Hypothyroidism   . Renal disorder    pelvic kidney  . S/P colonoscopy 2003   Dr. Nicolasa Ducking: normal colon, normal path.   . Vitamin D deficiency disease    Past Surgical History:  Procedure Laterality Date  . ABDOMINAL HYSTERECTOMY     complete  . BREAST SURGERY     breast biopsy   . COLONOSCOPY  03/09/2011   DGL:OVFIEPP diverticula, small internal hemorrhoids.   . ESOPHAGOGASTRODUODENOSCOPY   03/09/2011   IRJ:JOACZYSAY in the distal esophagus/ Stenosis at the pylorus/ Mild gastritis/ Hiatal hernia. gastric bx showed gastritis but no H.pylori  . ESOPHAGOGASTRODUODENOSCOPY  03/20/11   SLF: stricture distal esophgus and stenosis at pyloris likely from  NSAIDs. s/p dialtion.   . ESOPHAGOGASTRODUODENOSCOPY (EGD) WITH ESOPHAGEAL DILATION  11/23/2011   SLF: Stricture was found at the gastroesophageal junction/ Small hiatal hernia/  Sessile polyp was found in the gastric body and gastric fundus/ Stenosis was found at the pylorus/ The duodenal mucosa showed no abnormalities in the bulb and second portion of the duodenum  . ESOPHAGOGASTRODUODENOSCOPY (EGD) WITH ESOPHAGEAL DILATION  12/03/2011   TKZ:SWFUX was found in the entire examined stomach/Stricture was found at the gastroesophageal junction  . LAPAROSCOPY  2000   [place]-stomach pain-pt reports negative  . ROTATOR CUFF REPAIR     Allergies  Allergen Reactions  . Ivp Dye [Iodinated Diagnostic Agents] Hives and Itching    Pt. States this happened 4 or 5 years ago in [place].    . Calcium Channel Blockers   . Nifedipine    Current Outpatient Medications  Medication Sig Dispense Refill  . albuterol (PROVENTIL HFA;VENTOLIN HFA) 108 (90 BASE) MCG/ACT inhaler Inhale 2 puffs into the lungs every 6 (six) hours as needed. Reported on 03/24/2015    . amitriptyline (ELAVIL) 10 MG tablet Take 10 mg by mouth at bedtime.     Marland Kitchen amLODipine (NORVASC) 10 MG tablet Take 10 mg by mouth daily.    Marland Kitchen aspirin EC 81 MG tablet Take 81 mg by mouth daily.    Marland Kitchen atorvastatin (LIPITOR) 40 MG tablet Take 40 mg by mouth daily.     . benazepril-hydrochlorthiazide (LOTENSIN HCT) 20-12.5 MG per tablet  Take 1 tablet by mouth Daily.    . Cholecalciferol (VITAMIN D-1000 MAX ST) 1000 units tablet Take 1,000 mg by mouth daily.    . citalopram (CELEXA) 40 MG tablet Take 40 mg by mouth daily.      Marland Kitchen DEXILANT 60 MG capsule TAKE 1 CAPSULE BY MOUTH EVERY DAY    . fluticasone (FLOVENT HFA) 110 MCG/ACT inhaler Inhale 1 puff into the lungs 2 (two) times daily as needed. Shortness of breath.    . levothyroxine (SYNTHROID, LEVOTHROID) 50 MCG tablet Take 50 mcg by mouth daily.     Marland Kitchen LINZESS 145 MCG CAPS capsule TAKE 1 CAPSULE BY MOUTH  EVERY DAY    . LORazepam (ATIVAN) 1 MG tablet Take 1 mg by mouth 2 (two) times daily.     . traMADol (ULTRAM) 50 MG tablet Take 1 tablet (50 mg total) by mouth every 6 (six) hours as needed for severe pain.    .      .       Review of Systems PER HPI OTHERWISE ALL SYSTEMS ARE NEGATIVE.    Objective:   Physical Exam  Constitutional: She is oriented to person, place, and time. She appears well-developed and well-nourished. No distress.  HENT:  Head: Normocephalic and atraumatic.  Mouth/Throat: Oropharynx is clear and moist. No oropharyngeal exudate.  Eyes: Pupils are equal, round, and reactive to light. No scleral icterus.  Neck: Normal range of motion. Neck supple.  Cardiovascular: Normal rate, regular rhythm and normal heart sounds.  Pulmonary/Chest: Effort normal and breath sounds normal. No respiratory distress.  Abdominal: Soft. Bowel sounds are normal. She exhibits no distension. There is tenderness. There is no rebound and no guarding.  MILD TTP IN THE EPIGASTRIUM, & BUQs  Musculoskeletal: She exhibits no edema.  Lymphadenopathy:    She has no cervical adenopathy.  Neurological: She is alert and oriented to person, place, and time.  NO  NEW FOCAL DEFICITS  Psychiatric:  FLAT AFFECT, SLIGHTLY ANXIOUS MOOD  Vitals reviewed.     Assessment & Plan:

## 2017-01-10 NOTE — Assessment & Plan Note (Signed)
SYMPTOMS CONTROLLED/RESOLVED.  CONTINUE LINZESS. FOLLOW UP IN 4 MOS.

## 2017-01-10 NOTE — Progress Notes (Signed)
Subjective:    Patient ID: Kelly Hebert, female    DOB: [birth year], 76 y.o.   MRN: [id number]  [name], NP   HPI Still c/o PAIN IN LUQ PAIN. LAST STEROIDS COT 2018. LUQ PAIN BEEN WORSE IT WAS REAL BAD. JUST STARTED IN DEC. [place] AND STOPPED RIGHT THERE AND THEN ACHY(FEW SECS). AFTER EATING BRUNSWICK STEW AND MILK LACTOSE FREE. TAKING DEXILANT EVERY DAY.  LINZESS WORKS. LINZESS CAUSED DIARRHEA LONG WHILE AGO. BMs: EVERY DAY. NAUSEATED OFF AND ON(VERY OFTEN AFTER SHE EATS). BLOATING: SEVERE-WITH NAUSEA. HARD TIME SWALLOWING LARGE PILL. FEELS THEM IN HER ESOPHAGUS. TAKES PILLS ONE AT A TIME. NO TROUBLE SWALLOWING SOLIDS. FEELS LIKE SHE GETS FULL FAST AGAIN. LAST EGD/DIL-ESO/PYLORUS 2013.  PT DENIES FEVER, CHILLS, DYSURIA, HEMATURIA.HEMATOCHEZIA, HEMATEMESIS, vomiting, melena, diarrhea, CHEST PAIN, SHORTNESS OF BREATH, CHANGE IN BOWEL IN HABITS, constipation, problems swallowing, OR heartburn or indigestion.  Past Medical History:  Diagnosis Date  . Acid reflux   . Anxiety   . Cancer [facility])    endometrial cancer  . Constipation   . COPD (chronic obstructive pulmonary disease) ([place])   . Depression   . Gout   . Heart murmur   . Hx of pyloric stenosis 11/14/2011  . Hypercholesteremia   . Hyperglycemia   . Hypertension   . Hypothyroidism   . Renal disorder    pelvic kidney  . S/P colonoscopy 2003   Dr. Nicolasa Ducking: normal colon, normal path.   . Vitamin D deficiency disease    Past Surgical History:  Procedure Laterality Date  . ABDOMINAL HYSTERECTOMY     complete  . BREAST SURGERY     breast biopsy   . COLONOSCOPY  03/09/2011   NWG:NFAOZHY diverticula, small internal hemorrhoids.   . ESOPHAGOGASTRODUODENOSCOPY   03/09/2011   QMV:HQIONGEXB in the distal esophagus/ Stenosis at the pylorus/ Mild gastritis/ Hiatal hernia. gastric bx showed gastritis but no H.pylori  . ESOPHAGOGASTRODUODENOSCOPY  03/20/11   SLF: stricture distal esophgus and stenosis at pyloris likely from  NSAIDs. s/p dialtion.   . ESOPHAGOGASTRODUODENOSCOPY (EGD) WITH ESOPHAGEAL DILATION  11/23/2011   SLF: Stricture was found at the gastroesophageal junction/ Small hiatal hernia/  Sessile polyp was found in the gastric body and gastric fundus/ Stenosis was found at the pylorus/ The duodenal mucosa showed no abnormalities in the bulb and second portion of the duodenum  . ESOPHAGOGASTRODUODENOSCOPY (EGD) WITH ESOPHAGEAL DILATION  12/03/2011   MWU:XLKGM was found in the entire examined stomach/Stricture was found at the gastroesophageal junction  . LAPAROSCOPY  2000   [place]-stomach pain-pt reports negative  . ROTATOR CUFF REPAIR     Allergies  Allergen Reactions  . Ivp Dye [Iodinated Diagnostic Agents] Hives and Itching    Pt. States this happened 4 or 5 years ago in [place].    . Calcium Channel Blockers   . Nifedipine    Current Outpatient Medications  Medication Sig Dispense Refill  . albuterol (PROVENTIL HFA;VENTOLIN HFA) 108 (90 BASE) MCG/ACT inhaler Inhale 2 puffs into the lungs every 6 (six) hours as needed. Reported on 03/24/2015    . amitriptyline (ELAVIL) 10 MG tablet Take 10 mg by mouth at bedtime.     Marland Kitchen amLODipine (NORVASC) 10 MG tablet Take 10 mg by mouth daily.    Marland Kitchen aspirin EC 81 MG tablet Take 81 mg by mouth daily.    Marland Kitchen atorvastatin (LIPITOR) 40 MG tablet Take 40 mg by mouth daily.     . benazepril-hydrochlorthiazide (LOTENSIN HCT) 20-12.5 MG per tablet  Take 1 tablet by mouth Daily.    . Cholecalciferol (VITAMIN D-1000 MAX ST) 1000 units tablet Take 1,000 mg by mouth daily.    . citalopram (CELEXA) 40 MG tablet Take 40 mg by mouth daily.      Marland Kitchen DEXILANT 60 MG capsule TAKE 1 CAPSULE BY MOUTH EVERY DAY    . fluticasone (FLOVENT HFA) 110 MCG/ACT inhaler Inhale 1 puff into the lungs 2 (two) times daily as needed. Shortness of breath.    . levothyroxine (SYNTHROID, LEVOTHROID) 50 MCG tablet Take 50 mcg by mouth daily.     Marland Kitchen LINZESS 145 MCG CAPS capsule TAKE 1 CAPSULE BY MOUTH  EVERY DAY    . LORazepam (ATIVAN) 1 MG tablet Take 1 mg by mouth 2 (two) times daily.     . traMADol (ULTRAM) 50 MG tablet Take 1 tablet (50 mg total) by mouth every 6 (six) hours as needed for severe pain.    .      .       Review of Systems PER HPI OTHERWISE ALL SYSTEMS ARE NEGATIVE.    Objective:   Physical Exam  Constitutional: She is oriented to person, place, and time. She appears well-developed and well-nourished. No distress.  HENT:  Head: Normocephalic and atraumatic.  Mouth/Throat: Oropharynx is clear and moist. No oropharyngeal exudate.  Eyes: Pupils are equal, round, and reactive to light. No scleral icterus.  Neck: Normal range of motion. Neck supple.  Cardiovascular: Normal rate, regular rhythm and normal heart sounds.  Pulmonary/Chest: Effort normal and breath sounds normal. No respiratory distress.  Abdominal: Soft. Bowel sounds are normal. She exhibits no distension. There is tenderness. There is no rebound and no guarding.  MILD TTP IN THE EPIGASTRIUM, & BUQs  Musculoskeletal: She exhibits no edema.  Lymphadenopathy:    She has no cervical adenopathy.  Neurological: She is alert and oriented to person, place, and time.  NO  NEW FOCAL DEFICITS  Psychiatric:  FLAT AFFECT, SLIGHTLY ANXIOUS MOOD  Vitals reviewed.     Assessment & Plan:

## 2017-01-10 NOTE — H&P (View-Only) (Signed)
Subjective:    Patient ID: Kelly Hebert, female    DOB: 13-Aug-1940, 76 y.o.   MRN: [id number]  Kelly Rival, NP   HPI Still c/o PAIN IN LUQ PAIN. LAST STEROIDS COT 2018. LUQ PAIN BEEN WORSE IT WAS REAL BAD. JUST STARTED IN DEC. [place] AND STOPPED RIGHT THERE AND THEN ACHY(FEW SECS). AFTER EATING BRUNSWICK STEW AND MILK LACTOSE FREE. TAKING DEXILANT EVERY DAY.  LINZESS WORKS. LINZESS CAUSED DIARRHEA LONG WHILE AGO. BMs: EVERY DAY. NAUSEATED OFF AND ON(VERY OFTEN AFTER SHE EATS). BLOATING: SEVERE-WITH NAUSEA. HARD TIME SWALLOWING LARGE PILL. FEELS THEM IN HER ESOPHAGUS. TAKES PILLS ONE AT A TIME. NO TROUBLE SWALLOWING SOLIDS. FEELS LIKE SHE GETS FULL FAST AGAIN. LAST EGD/DIL-ESO/PYLORUS 2013.  PT DENIES FEVER, CHILLS, DYSURIA, HEMATURIA.HEMATOCHEZIA, HEMATEMESIS, vomiting, melena, diarrhea, CHEST PAIN, SHORTNESS OF BREATH, CHANGE IN BOWEL IN HABITS, constipation, problems swallowing, OR heartburn or indigestion.  Past Medical History:  Diagnosis Date  . Acid reflux   . Anxiety   . Cancer [facility])    endometrial cancer  . Constipation   . COPD (chronic obstructive pulmonary disease) ([place])   . Depression   . Gout   . Heart murmur   . Hx of pyloric stenosis 11/14/2011  . Hypercholesteremia   . Hyperglycemia   . Hypertension   . Hypothyroidism   . Renal disorder    pelvic kidney  . S/P colonoscopy 2003   Dr. Nicolasa Ducking: normal colon, normal path.   . Vitamin D deficiency disease    Past Surgical History:  Procedure Laterality Date  . ABDOMINAL HYSTERECTOMY     complete  . BREAST SURGERY     breast biopsy   . COLONOSCOPY  03/09/2011   ZJI:RCVELFY diverticula, small internal hemorrhoids.   . ESOPHAGOGASTRODUODENOSCOPY   03/09/2011   BOF:BPZWCHENI in the distal esophagus/ Stenosis at the pylorus/ Mild gastritis/ Hiatal hernia. gastric bx showed gastritis but no H.pylori  . ESOPHAGOGASTRODUODENOSCOPY  03/20/11   SLF: stricture distal esophgus and stenosis at pyloris likely from  NSAIDs. s/p dialtion.   . ESOPHAGOGASTRODUODENOSCOPY (EGD) WITH ESOPHAGEAL DILATION  11/23/2011   SLF: Stricture was found at the gastroesophageal junction/ Small hiatal hernia/  Sessile polyp was found in the gastric body and gastric fundus/ Stenosis was found at the pylorus/ The duodenal mucosa showed no abnormalities in the bulb and second portion of the duodenum  . ESOPHAGOGASTRODUODENOSCOPY (EGD) WITH ESOPHAGEAL DILATION  12/03/2011   DPO:EUMPN was found in the entire examined stomach/Stricture was found at the gastroesophageal junction  . LAPAROSCOPY  2000   [place]-stomach pain-pt reports negative  . ROTATOR CUFF REPAIR     Allergies  Allergen Reactions  . Ivp Dye [Iodinated Diagnostic Agents] Hives and Itching    Pt. States this happened 4 or 5 years ago in [place].    . Calcium Channel Blockers   . Nifedipine    Current Outpatient Medications  Medication Sig Dispense Refill  . albuterol (PROVENTIL HFA;VENTOLIN HFA) 108 (90 BASE) MCG/ACT inhaler Inhale 2 puffs into the lungs every 6 (six) hours as needed. Reported on 03/24/2015    . amitriptyline (ELAVIL) 10 MG tablet Take 10 mg by mouth at bedtime.     Marland Kitchen amLODipine (NORVASC) 10 MG tablet Take 10 mg by mouth daily.    Marland Kitchen aspirin EC 81 MG tablet Take 81 mg by mouth daily.    Marland Kitchen atorvastatin (LIPITOR) 40 MG tablet Take 40 mg by mouth daily.     . benazepril-hydrochlorthiazide (LOTENSIN HCT) 20-12.5 MG per tablet  Take 1 tablet by mouth Daily.    . Cholecalciferol (VITAMIN D-1000 MAX ST) 1000 units tablet Take 1,000 mg by mouth daily.    . citalopram (CELEXA) 40 MG tablet Take 40 mg by mouth daily.      Marland Kitchen DEXILANT 60 MG capsule TAKE 1 CAPSULE BY MOUTH EVERY DAY    . fluticasone (FLOVENT HFA) 110 MCG/ACT inhaler Inhale 1 puff into the lungs 2 (two) times daily as needed. Shortness of breath.    . levothyroxine (SYNTHROID, LEVOTHROID) 50 MCG tablet Take 50 mcg by mouth daily.     Marland Kitchen LINZESS 145 MCG CAPS capsule TAKE 1 CAPSULE BY MOUTH  EVERY DAY    . LORazepam (ATIVAN) 1 MG tablet Take 1 mg by mouth 2 (two) times daily.     . traMADol (ULTRAM) 50 MG tablet Take 1 tablet (50 mg total) by mouth every 6 (six) hours as needed for severe pain.    .      .       Review of Systems PER HPI OTHERWISE ALL SYSTEMS ARE NEGATIVE.    Objective:   Physical Exam  Constitutional: She is oriented to person, place, and time. She appears well-developed and well-nourished. No distress.  HENT:  Head: Normocephalic and atraumatic.  Mouth/Throat: Oropharynx is clear and moist. No oropharyngeal exudate.  Eyes: Pupils are equal, round, and reactive to light. No scleral icterus.  Neck: Normal range of motion. Neck supple.  Cardiovascular: Normal rate, regular rhythm and normal heart sounds.  Pulmonary/Chest: Effort normal and breath sounds normal. No respiratory distress.  Abdominal: Soft. Bowel sounds are normal. She exhibits no distension. There is tenderness. There is no rebound and no guarding.  MILD TTP IN THE EPIGASTRIUM, & BUQs  Musculoskeletal: She exhibits no edema.  Lymphadenopathy:    She has no cervical adenopathy.  Neurological: She is alert and oriented to person, place, and time.  NO  NEW FOCAL DEFICITS  Psychiatric:  FLAT AFFECT, SLIGHTLY ANXIOUS MOOD  Vitals reviewed.     Assessment & Plan:

## 2017-01-10 NOTE — Patient Instructions (Signed)
COMPLETE UPPER ENDOSCOPY TO STRETCH YOUR ESOPHAGUS AND PYLORUS.  CONTINUE DEXILANT.  CONTINUE LINZESS.  FOLLOW UP IN 4 MOS.

## 2017-01-10 NOTE — Progress Notes (Signed)
ON RECALL  °

## 2017-01-19 ENCOUNTER — Emergency Department (HOSPITAL_COMMUNITY)
Admission: EM | Admit: 2017-01-19 | Discharge: 2017-01-19 | Disposition: A | Discharge status: Home / Self Care | Payer: Medicare Other | Attending: Emergency Medicine | Admitting: Emergency Medicine

## 2017-01-19 ENCOUNTER — Encounter (HOSPITAL_COMMUNITY): Payer: Self-pay | Admitting: Emergency Medicine

## 2017-01-19 DIAGNOSIS — E039 Hypothyroidism, unspecified: Secondary | ICD-10-CM | POA: Diagnosis not present

## 2017-01-19 DIAGNOSIS — N390 Urinary tract infection, site not specified: Secondary | ICD-10-CM | POA: Insufficient documentation

## 2017-01-19 DIAGNOSIS — R1030 Lower abdominal pain, unspecified: Secondary | ICD-10-CM | POA: Diagnosis present

## 2017-01-19 DIAGNOSIS — I10 Essential (primary) hypertension: Secondary | ICD-10-CM | POA: Insufficient documentation

## 2017-01-19 DIAGNOSIS — Z79899 Other long term (current) drug therapy: Secondary | ICD-10-CM | POA: Insufficient documentation

## 2017-01-19 LAB — COMPREHENSIVE METABOLIC PANEL
ALK PHOS: 100 U/L (ref 38–126)
ALT: 33 U/L (ref 14–54)
ANION GAP: 11 (ref 5–15)
AST: 27 U/L (ref 15–41)
Albumin: 4.4 g/dL (ref 3.5–5.0)
BILIRUBIN TOTAL: 0.5 mg/dL (ref 0.3–1.2)
BUN: 13 mg/dL (ref 6–20)
CALCIUM: 9.6 mg/dL (ref 8.9–10.3)
CO2: 23 mmol/L (ref 22–32)
CREATININE: 1 mg/dL (ref 0.44–1.00)
Chloride: 103 mmol/L (ref 101–111)
GFR calc non Af Amer: 53 mL/min — ABNORMAL LOW (ref >60)
GLUCOSE: 97 mg/dL (ref 65–99)
Potassium: 3.7 mmol/L (ref 3.5–5.1)
SODIUM: 137 mmol/L (ref 135–145)
TOTAL PROTEIN: 7.8 g/dL (ref 6.5–8.1)

## 2017-01-19 LAB — CBC WITH DIFFERENTIAL/PLATELET
Basophils Absolute: 0 10*3/uL (ref 0.0–0.1)
Basophils Relative: 0 %
EOS ABS: 0.3 10*3/uL (ref 0.0–0.7)
Eosinophils Relative: 3 %
HEMATOCRIT: 41.1 % (ref 36.0–46.0)
HEMOGLOBIN: 13.2 g/dL (ref 12.0–15.0)
LYMPHS ABS: 3.3 10*3/uL (ref 0.7–4.0)
LYMPHS PCT: 36 %
MCH: 29.9 pg (ref 26.0–34.0)
MCHC: 32.1 g/dL (ref 30.0–36.0)
MCV: 93 fL (ref 78.0–100.0)
MONOS PCT: 10 %
Monocytes Absolute: 0.9 10*3/uL (ref 0.1–1.0)
NEUTROS ABS: 4.5 10*3/uL (ref 1.7–7.7)
NEUTROS PCT: 51 %
Platelets: 261 10*3/uL (ref 150–400)
RBC: 4.42 MIL/uL (ref 3.87–5.11)
RDW: 12.6 % (ref 11.5–15.5)
WBC: 9 10*3/uL (ref 4.0–10.5)

## 2017-01-19 LAB — URINALYSIS, ROUTINE W REFLEX MICROSCOPIC
BACTERIA UA: NONE SEEN
Bilirubin Urine: NEGATIVE
Glucose, UA: NEGATIVE mg/dL
Hgb urine dipstick: NEGATIVE
Ketones, ur: NEGATIVE mg/dL
NITRITE: NEGATIVE
PH: 5 (ref 5.0–8.0)
Protein, ur: 100 mg/dL — AB
SPECIFIC GRAVITY, URINE: 1.019 (ref 1.005–1.030)

## 2017-01-19 MED ORDER — CEPHALEXIN 500 MG PO CAPS
500.0000 mg | ORAL_CAPSULE | Freq: Once | ORAL | Status: AC
  Administered 2017-01-19: 500 mg via ORAL
  Filled 2017-01-19: qty 1

## 2017-01-19 MED ORDER — CEPHALEXIN 500 MG PO CAPS: 500.0000 mg | ORAL_CAPSULE | Freq: Four times a day (QID) | ORAL | 0 refills | Status: DC

## 2017-01-19 NOTE — Discharge Instructions (Signed)
You have a urinary tract infection.  Increase fluids.  Prescription for antibiotic.  Your blood work was good.  Follow-up with your primary care doctor.

## 2017-01-19 NOTE — ED Notes (Signed)
Pt ambulating to BR °

## 2017-01-19 NOTE — ED Provider Notes (Signed)
[facility] EMERGENCY DEPARTMENT Provider Note   CSN: [id number] Arrival date & time: 01/19/17  6644     History   Chief Complaint Chief Complaint  Patient presents with  . Abdominal Pain    HPI Kelly Hebert is a 76 y.o. female.  Suprapubic discomfort for 1 week with some radiation to the proximal bilateral anterior thighs.  She is eating normally without vomiting or diarrhea.  She had a normal bowel movement yesterday.  No fever, sweats, chills.  She does complain of urinary pressure but no frequency or hematuria.  She is able to perform her normal ADLs.  Severity of symptoms is mild.      Past Medical History:  Diagnosis Date  . Acid reflux   . Anxiety   . Cancer [facility])    endometrial cancer  . Constipation   . COPD (chronic obstructive pulmonary disease) ([place])   . Depression   . Gout   . Heart murmur   . Hx of pyloric stenosis 11/14/2011  . Hypercholesteremia   . Hyperglycemia   . Hypertension   . Hypothyroidism   . Renal disorder    pelvic kidney  . S/P colonoscopy 2003   Dr. Nicolasa Ducking: normal colon, normal path.   . Vitamin D deficiency disease     Patient Active Problem List   Diagnosis Date Noted  . Dyspepsia 05/17/2016  . Constipation 04/17/2012  . Dysuria 11/14/2011  . Dysphagia, idiopathic 02/19/2011  . Asthma 01/07/2011  . Benign hypertension 01/07/2011  . Hyperlipidemia 01/07/2011  . Anxiety 01/07/2011  . Hypothyroidism 01/07/2011  . GERD (gastroesophageal reflux disease) 01/07/2011    Past Surgical History:  Procedure Laterality Date  . ABDOMINAL HYSTERECTOMY     complete  . BREAST SURGERY     breast biopsy   . COLONOSCOPY  03/09/2011   IHK:VQQVZDG diverticula, small internal hemorrhoids.   . ESOPHAGOGASTRODUODENOSCOPY   03/09/2011   LOV:FIEPPIRJJ in the distal esophagus/ Stenosis at the pylorus/ Mild gastritis/ Hiatal hernia. gastric bx showed gastritis but no H.pylori  . ESOPHAGOGASTRODUODENOSCOPY  03/20/11   SLF: stricture distal  esophgus and stenosis at pyloris likely from NSAIDs. s/p dialtion.   . ESOPHAGOGASTRODUODENOSCOPY (EGD) WITH ESOPHAGEAL DILATION  11/23/2011   SLF: Stricture was found at the gastroesophageal junction/ Small hiatal hernia/  Sessile polyp was found in the gastric body and gastric fundus/ Stenosis was found at the pylorus/ The duodenal mucosa showed no abnormalities in the bulb and second portion of the duodenum  . ESOPHAGOGASTRODUODENOSCOPY (EGD) WITH ESOPHAGEAL DILATION  12/03/2011   OAC:ZYSAY was found in the entire examined stomach/Stricture was found at the gastroesophageal junction  . LAPAROSCOPY  2000   [facility]-stomach pain-pt reports negative  . ROTATOR CUFF REPAIR      OB History    Gravida Para Term Preterm AB Living   3 3 3     3    SAB TAB Ectopic Multiple Live Births                   Home Medications    Prior to Admission medications   Medication Sig Start Date End Date Taking? Authorizing Provider  acetaminophen (TYLENOL) 325 MG tablet Take 325 mg by mouth daily as needed for moderate pain or headache.    [provider]  albuterol (PROVENTIL HFA;VENTOLIN HFA) 108 (90 BASE) MCG/ACT inhaler Inhale 2 puffs into the lungs every 6 (six) hours as needed. Reported on 03/24/2015    [provider]  amitriptyline (ELAVIL) 10 MG tablet  Take 10 mg by mouth at bedtime.  07/29/12   [provider]  amLODipine (NORVASC) 10 MG tablet Take 10 mg by mouth daily.    [provider]  aspirin EC 81 MG tablet Take 81 mg by mouth daily.    [provider]  atorvastatin (LIPITOR) 40 MG tablet Take 40 mg by mouth at bedtime.  04/10/12   [provider]  benazepril-hydrochlorthiazide (LOTENSIN HCT) 20-12.5 MG per tablet Take 1 tablet by mouth Daily. 10/03/11   [provider]  cephALEXin (KEFLEX) 500 MG capsule Take 1 capsule (500 mg total) by mouth 4 (four) times daily. 01/19/17   Nat Christen, MD  Cholecalciferol (VITAMIN D) 2000 units  CAPS Take 2,000 Units by mouth daily.    [provider]  citalopram (CELEXA) 40 MG tablet Take 40 mg by mouth daily.      [provider]  DEXILANT 60 MG capsule TAKE 1 CAPSULE BY MOUTH EVERY DAY 03/20/16   Mahala Menghini, PA-C  fluticasone (FLOVENT HFA) 110 MCG/ACT inhaler Inhale 1 puff into the lungs 2 (two) times daily as needed. Shortness of breath.    [provider]  levothyroxine (SYNTHROID, LEVOTHROID) 50 MCG tablet Take 50 mcg by mouth daily.  04/10/12   [provider]  LINZESS 145 MCG CAPS capsule TAKE 1 CAPSULE BY MOUTH EVERY DAY Patient taking differently: TAKE 1 CAPSULE BY MOUTH DAILY AS NEEDED IBS 05/18/16   Annitta Needs, NP  LORazepam (ATIVAN) 1 MG tablet Take 1 mg by mouth 2 (two) times daily.     [provider]  Naphazoline-Glycerin (REDNESS RELIEF OP) Apply 1 drop to eye daily as needed (redness).    [provider]    Family History Family History  Problem Relation Age of Onset  . Kidney disease Sister   . Cirrhosis Brother        ?etoh  . Diabetes Brother   . Heart attack Brother   . Heart disease Mother   . Heart attack Mother   . Heart disease Father   . Heart attack Father   . Diabetes Brother   . Cirrhosis Sister        Non-etoh  . Colon cancer Neg Hx     Social History Social History   Tobacco Use  . Smoking status: Never Smoker  . Smokeless tobacco: Never Used  Substance Use Topics  . Alcohol use: No  . Drug use: No     Allergies   Ivp dye [iodinated diagnostic agents]; Calcium channel blockers; and Nifedipine   Review of Systems Review of Systems  All other systems reviewed and are negative.    Physical Exam Updated Vital Signs BP (!) 154/80 (BP Location: Left Arm)   Pulse 75   Temp 98.8 F (37.1 C) (Oral)   Resp 16   Ht 5\' 2"  (1.575 m)   Wt 68 kg (150 lb)   SpO2 100%   BMI 27.44 kg/m   Physical Exam  Constitutional: She is oriented to person, place, and time. She  appears well-developed and well-nourished.  HENT:  Head: Normocephalic and atraumatic.  Eyes: Conjunctivae are normal.  Neck: Neck supple.  Cardiovascular: Normal rate and regular rhythm.  Pulmonary/Chest: Effort normal and breath sounds normal.  Abdominal: Soft. Bowel sounds are normal.  Musculoskeletal: Normal range of motion.  Neurological: She is alert and oriented to person, place, and time.  Skin: Skin is warm and dry.  Psychiatric: She has a normal mood and  affect. Her behavior is normal.  Nursing note and vitals reviewed.    ED Treatments / Results  Labs (all labs ordered are listed, but only abnormal results are displayed) Labs Reviewed  COMPREHENSIVE METABOLIC PANEL - Abnormal; Notable for the following components:      Result Value   GFR calc non Af Amer 53 (*)    All other components within normal limits  URINALYSIS, ROUTINE W REFLEX MICROSCOPIC - Abnormal; Notable for the following components:   Protein, ur 100 (*)    Leukocytes, UA MODERATE (*)    Squamous Epithelial / LPF 0-5 (*)    All other components within normal limits  URINE CULTURE  CBC WITH DIFFERENTIAL/PLATELET    EKG  EKG Interpretation None       Radiology No results found.  Procedures Procedures (including critical care time)  Medications Ordered in ED Medications  cephALEXin (KEFLEX) capsule 500 mg (500 mg Oral Given 01/19/17 1303)     Initial Impression / Assessment and Plan / ED Course  I have reviewed the triage vital signs and the nursing notes.  Pertinent labs & imaging results that were available during my care of the patient were reviewed by me and considered in my medical decision making (see chart for details).     Patient is in no acute distress.  No acute abdomen on physical exam.  She is slightly tender over suprapubic area.  Urinalysis shows evidence of infection.  Will Rx Keflex 500 mg 4 times daily.  Urine culture.  Final Clinical Impressions(s) / ED Diagnoses    Final diagnoses:  Urinary tract infection without hematuria, site unspecified    ED Discharge Orders        Ordered    cephALEXin (KEFLEX) 500 MG capsule  4 times daily     01/19/17 1311       Nat Christen, MD 01/19/17 1335

## 2017-01-19 NOTE — ED Triage Notes (Signed)
Pt reports low mid abd pain with pain down both legs at times.  States she has a lot of urinary pressure at times, but denies dysuria.

## 2017-01-21 LAB — URINE CULTURE: Culture: <10000 — AB

## 2017-01-24 ENCOUNTER — Other Ambulatory Visit: Payer: Self-pay

## 2017-01-24 ENCOUNTER — Telehealth: Payer: Self-pay

## 2017-01-24 ENCOUNTER — Encounter (HOSPITAL_COMMUNITY): Payer: Self-pay | Admitting: *Deleted

## 2017-01-24 ENCOUNTER — Ambulatory Visit (HOSPITAL_COMMUNITY)
Admission: RE | Admit: 2017-01-24 | Discharge: 2017-01-24 | Disposition: A | Discharge status: Home / Self Care | Payer: Medicare Other | Source: Ambulatory Visit | Attending: Gastroenterology | Admitting: Gastroenterology

## 2017-01-24 ENCOUNTER — Encounter (HOSPITAL_COMMUNITY)
Admission: RE | Disposition: A | Discharge status: Home / Self Care | Payer: Self-pay | Source: Ambulatory Visit | Attending: Gastroenterology

## 2017-01-24 DIAGNOSIS — Z79899 Other long term (current) drug therapy: Secondary | ICD-10-CM | POA: Insufficient documentation

## 2017-01-24 DIAGNOSIS — R1310 Dysphagia, unspecified: Secondary | ICD-10-CM

## 2017-01-24 DIAGNOSIS — Z91041 Radiographic dye allergy status: Secondary | ICD-10-CM | POA: Diagnosis not present

## 2017-01-24 DIAGNOSIS — I10 Essential (primary) hypertension: Secondary | ICD-10-CM | POA: Diagnosis not present

## 2017-01-24 DIAGNOSIS — K222 Esophageal obstruction: Secondary | ICD-10-CM | POA: Diagnosis not present

## 2017-01-24 DIAGNOSIS — Z888 Allergy status to other drugs, medicaments and biological substances status: Secondary | ICD-10-CM | POA: Diagnosis not present

## 2017-01-24 DIAGNOSIS — F419 Anxiety disorder, unspecified: Secondary | ICD-10-CM | POA: Diagnosis not present

## 2017-01-24 DIAGNOSIS — K219 Gastro-esophageal reflux disease without esophagitis: Secondary | ICD-10-CM | POA: Insufficient documentation

## 2017-01-24 DIAGNOSIS — K311 Adult hypertrophic pyloric stenosis: Secondary | ICD-10-CM | POA: Insufficient documentation

## 2017-01-24 DIAGNOSIS — E559 Vitamin D deficiency, unspecified: Secondary | ICD-10-CM | POA: Diagnosis not present

## 2017-01-24 DIAGNOSIS — M109 Gout, unspecified: Secondary | ICD-10-CM | POA: Diagnosis not present

## 2017-01-24 DIAGNOSIS — E039 Hypothyroidism, unspecified: Secondary | ICD-10-CM | POA: Diagnosis not present

## 2017-01-24 DIAGNOSIS — R6881 Early satiety: Secondary | ICD-10-CM | POA: Diagnosis not present

## 2017-01-24 DIAGNOSIS — F329 Major depressive disorder, single episode, unspecified: Secondary | ICD-10-CM | POA: Diagnosis not present

## 2017-01-24 DIAGNOSIS — E7800 Pure hypercholesterolemia, unspecified: Secondary | ICD-10-CM | POA: Diagnosis not present

## 2017-01-24 DIAGNOSIS — Z8542 Personal history of malignant neoplasm of other parts of uterus: Secondary | ICD-10-CM | POA: Insufficient documentation

## 2017-01-24 DIAGNOSIS — R011 Cardiac murmur, unspecified: Secondary | ICD-10-CM | POA: Insufficient documentation

## 2017-01-24 DIAGNOSIS — Z7982 Long term (current) use of aspirin: Secondary | ICD-10-CM | POA: Insufficient documentation

## 2017-01-24 DIAGNOSIS — J449 Chronic obstructive pulmonary disease, unspecified: Secondary | ICD-10-CM | POA: Insufficient documentation

## 2017-01-24 HISTORY — PX: ESOPHAGOGASTRODUODENOSCOPY: SHX5428

## 2017-01-24 HISTORY — PX: SAVORY DILATION: SHX5439

## 2017-01-24 SURGERY — EGD (ESOPHAGOGASTRODUODENOSCOPY)
Anesthesia: Moderate Sedation

## 2017-01-24 MED ORDER — SODIUM CHLORIDE 0.9 % IV SOLN
INTRAVENOUS | Status: DC
  Administered 2017-01-24: 10:00:00 via INTRAVENOUS

## 2017-01-24 MED ORDER — ONDANSETRON HCL 4 MG/2ML IJ SOLN
INTRAMUSCULAR | Status: AC
  Filled 2017-01-24: qty 2

## 2017-01-24 MED ORDER — MEPERIDINE HCL 100 MG/ML IJ SOLN
INTRAMUSCULAR | Status: DC | PRN
  Administered 2017-01-24: 50 mg via INTRAVENOUS
  Administered 2017-01-24 (×2): 25 mg via INTRAVENOUS

## 2017-01-24 MED ORDER — MIDAZOLAM HCL 5 MG/5ML IJ SOLN
INTRAMUSCULAR | Status: AC
  Filled 2017-01-24: qty 10

## 2017-01-24 MED ORDER — LIDOCAINE VISCOUS 2 % MT SOLN
OROMUCOSAL | Status: AC
  Filled 2017-01-24: qty 15

## 2017-01-24 MED ORDER — LIDOCAINE VISCOUS 2 % MT SOLN
OROMUCOSAL | Status: DC | PRN
  Administered 2017-01-24: 1 via OROMUCOSAL

## 2017-01-24 MED ORDER — MINERAL OIL PO OIL
TOPICAL_OIL | ORAL | Status: AC
  Filled 2017-01-24: qty 30

## 2017-01-24 MED ORDER — ONDANSETRON HCL 4 MG/2ML IJ SOLN
4.0000 mg | Freq: Once | INTRAMUSCULAR | Status: AC
  Administered 2017-01-24: 4 mg via INTRAVENOUS

## 2017-01-24 MED ORDER — MIDAZOLAM HCL 5 MG/5ML IJ SOLN
INTRAMUSCULAR | Status: DC | PRN
  Administered 2017-01-24: 2 mg via INTRAVENOUS
  Administered 2017-01-24: 1 mg via INTRAVENOUS
  Administered 2017-01-24: 2 mg via INTRAVENOUS

## 2017-01-24 MED ORDER — MEPERIDINE HCL 100 MG/ML IJ SOLN
INTRAMUSCULAR | Status: AC
  Filled 2017-01-24: qty 2

## 2017-01-24 NOTE — Telephone Encounter (Signed)
Anderson Malta at [facility] called office. Dr. Oneida Alar wants pt to have repeat EGD/Dil in 2-3 weeks. Called pt. EGD/Dil scheduled for 02/08/17 at 12:45pm. Instructions mailed.

## 2017-01-24 NOTE — Discharge Instructions (Signed)
I STRETCHED YOUR ESOPHAGUS DUE TO A STRICTURE. IT WAS TOO TIGHT TO COMPLETE THE DILATION TODAY. 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 THIS IS THE REASON FOR YOUR upper abdominal pain. You have benign stomach polyps.    DRINK WATER TO KEEP YOUR URINE LIGHT YELLOW.  FOLLOW A SOFT MECHANICAL DIET.  MEATS SHOULD BE CHOPPED OR GROUND ONLY. DO NOT EAT CHUNKS OF ANYTHING. SEE INFO BELOW.  REPEAT EGD FOR esophageal DILATION IN 2-3 weeks. The office will call to schedule.   FOLLOW UP APPT IN May 2019 with DR. FIELDS. On May 8 at 10:00 am  UPPER ENDOSCOPY AFTER CARE Read the instructions outlined below and refer to this sheet in the next week. These discharge instructions provide you with general information on caring for yourself after you leave the hospital. While your treatment has been planned according to the most current medical practices available, unavoidable complications occasionally occur. If you have any problems or questions after discharge, call DR. FIELDS, [phone].  ACTIVITY  You may resume your regular activity, but move at a slower pace for the next 24 hours.   Take frequent rest periods for the next 24 hours.   Walking will help get rid of the air and reduce the bloated feeling in your belly (abdomen).   No driving for 24 hours (because of the medicine (anesthesia) used during the test).   You may shower.   Do not sign any important legal documents or operate any machinery for 24 hours (because of the anesthesia used during the test).    NUTRITION  Drink plenty of fluids.   You may resume your normal diet as instructed by your doctor.   Begin with a light meal and progress to your normal diet. Heavy or fried foods are harder to digest and may make you feel sick to your stomach (nauseated).   Avoid alcoholic beverages for 24 hours or as instructed.    MEDICATIONS  You may resume your normal medications.   WHAT YOU CAN EXPECT  TODAY  Some feelings of bloating in the abdomen.   Passage of more gas than usual.    IF YOU HAD A BIOPSY TAKEN DURING THE UPPER ENDOSCOPY:  Eat a soft diet IF YOU HAVE NAUSEA, BLOATING, ABDOMINAL PAIN, OR VOMITING.    FINDING OUT THE RESULTS OF YOUR TEST Not all test results are available during your visit. DR. Oneida Alar WILL CALL YOU WITHIN 14 DAYS OF YOUR PROCEDUE WITH YOUR RESULTS. Do not assume everything is normal if you have not heard from DR. FIELDS IN 14 DAYS, CALL HER OFFICE AT [phone].  SEEK IMMEDIATE MEDICAL ATTENTION AND CALL THE OFFICE: [phone] IF:  You have more than a spotting of blood in your stool.   Your belly is swollen (abdominal distention).   You are nauseated or vomiting.   You have a temperature over 101F.   You have abdominal pain or discomfort that is severe or gets worse throughout the day.   Pyloric Stenosis   Pyloric stenosis means that the opening (pylorus) out of the stomach is narrowed (stenosis). The narrowing is caused by an increase in the muscles of the pylorus. This increase in muscle results in a narrowing. This blocks food from passing out of the stomach into the small bowel.    SOFT MECHANICAL DIET This SOFT MECHANICAL DIET is restricted to:  Foods that are moist, soft-textured, and easy to chew and swallow.   Meats that are ground or are minced  no larger than one-quarter inch pieces. Meats are moist with gravy or sauce added.   Foods that do not include bread or bread-like textures except soft pancakes, well-moistened with syrup or sauce.   Textures with some chewing ability required.   Casseroles without rice.   Cooked vegetables that are less than half an inch in size and easily mashed with a fork. No cooked corn, peas, broccoli, cauliflower, cabbage, Brussels sprouts, asparagus, or other fibrous, non-tender or rubbery cooked vegetables.   Canned fruit except for pineapple. Fruit must be cut into pieces no larger than  half an inch in size.   Foods that do not include nuts, seeds, coconut, or sticky textures.   FOOD TEXTURES FOR DYSPHAGIA DIET LEVEL 2 -SOFT MECHANICAL DIET (includes all foods on Dysphagia Diet Level 1 - Pureed, in addition to the foods listed below)  FOOD GROUP: Breads. RECOMMENDED: Soft pancakes, well-moistened with syrup or sauce.  AVOID: All others.  FOOD GROUP: Cereals.  RECOMMENDED: Cooked cereals with little texture, including oatmeal. Unprocessed wheat bran stirred into cereals for bulk. Note: If thin liquids are restricted, it is important that all of the liquid is absorbed into the cereal.  AVOID: All dry cereals and any cooked cereals that may contain flax seeds or other seeds or nuts. Whole-grain, dry, or coarse cereals. Cereals with nuts, seeds, dried fruit, and/or coconut.  FOOD GROUP: Desserts. RECOMMENDED: Pudding, custard. Soft fruit pies with bottom crust only. Canned fruit (excluding pineapple). Soft, moist cakes with icing.Frozen malts, milk shakes, frozen yogurt, eggnog, nutritional supplements, ice cream, sherbet, regular or sugar-free gelatin, or any foods that become thin liquid at either room (70 F) or body temperature (98 F).  AVOID: Dry, coarse cakes and cookies. Anything with nuts, seeds, coconut, pineapple, or dried fruit. Breakfast yogurt with nuts. Rice or bread pudding.  FOOD GROUP: Fats. RECOMMENDED: Butter, margarine, cream for cereal (depending on liquid consistency recommendations), gravy, cream sauces, sour cream, sour cream dips with soft additives, mayonnaise, salad dressings, cream cheese, cream cheese spreads with soft additives, whipped toppings.  AVOID: All fats with coarse or chunky additives.  FOOD GROUP: Fruits. RECOMMENDED: Soft drained, canned, or cooked fruits without seeds or skin. Fresh soft and ripe banana. Fruit juices with a small amount of pulp. If thin liquids are restricted, fruit juices should be thickened to appropriate  consistency.  AVOID: Fresh or frozen fruits. Cooked fruit with skin or seeds. Dried fruits. Fresh, canned, or cooked pineapple.  FOOD GROUP: Meats and Meat Substitutes. (Meat pieces should not exceed 1/4 of an inch cube and should be tender.) RECOMMENDED: Moistened ground or cooked meat, poultry, or fish. Moist ground or tender meat may be served with gravy or sauce. Casseroles without rice. Moist macaroni and cheese, well-cooked pasta with meat sauce, tuna noodle casserole, soft, moist lasagna. Moist meatballs, meatloaf, or fish loaf. Protein salads, such as tuna or egg without large chunks, celery, or onion. Cottage cheese, smooth quiche without large chunks. Poached, scrambled, or soft-cooked eggs (egg yolks should not be runny but should be moist and able to be mashed with butter, margarine, or other moisture added to them). (Cook eggs to 160 F or use pasteurized eggs for safety.) Souffls may have small, soft chunks. Tofu. Well-cooked, slightly mashed, moist legumes, such as baked beans. All meats or protein substitutes should be served with sauces or moistened to help maintain cohesiveness in the oral cavity.  AVOID: Dry meats, tough meats (such as bacon, sausage, hot dogs, bratwurst). Dry casseroles  or casseroles with rice or large chunks. Peanut butter. Cheese slices and cubes. Hard-cooked or crisp fried eggs. Sandwiches.Pizza.  FOOD GROUP: Potatoes and Starches. RECOMMENDED: Well-cooked, moistened, boiled, baked, or mashed potatoes. Well-cooked shredded hash brown potatoes that are not crisp. (All potatoes need to be moist and in sauces.)Well-cooked noodles in sauce. Spaetzel or soft dumplings that have been moistened with butter or gravy.  AVOID: Potato skins and chips. Fried or French-fried potatoes. Rice.  FOOD GROUP: Soups. RECOMMENDED: Soups with easy-to-chew or easy-to-swallow meats or vegetables: Particle sizes in soups should be less than 1/2 inch. Soups will need to be thickened  to appropriate consistency if soup is thinner than prescribed liquid consistency.  AVOID: Soups with large chunks of meat and vegetables. Soups with rice, corn, peas.  FOOD GROUP: Vegetables. RECOMMENDED: All soft, well-cooked vegetables. Vegetables should be less than a half inch. Should be easily mashed with a fork.  AVOID: Cooked corn and peas. Broccoli, cabbage, Brussels sprouts, asparagus, or other fibrous, non-tender or rubbery cooked vegetables.  FOOD GROUP: Miscellaneous. RECOMMENDED: Jams and preserves without seeds, jelly. Sauces, salsas, etc., that may have small tender chunks less than 1/2 inch. Soft, smooth chocolate bars that are easily chewed.  AVOID: Seeds, nuts, coconut, or sticky foods. Chewy candies such as caramels or licorice.

## 2017-01-24 NOTE — Telephone Encounter (Signed)
Dr. Oneida Alar advised for pt to have Zofran 4mg  IV in preop. Orders entered for EGD/dil.

## 2017-01-24 NOTE — Interval H&P Note (Signed)
History and Physical Interval Note:  01/24/2017 10:10 AM  Kelly Hebert  has presented today for surgery, with the diagnosis of dysphagia/early satiety  The various methods of treatment have been discussed with the patient and family. After consideration of risks, benefits and other options for treatment, the patient has consented to  Procedure(s) with comments: ESOPHAGOGASTRODUODENOSCOPY (EGD) (N/A) - 10:45am SAVORY DILATION (N/A) as a surgical intervention .  The patient's history has been reviewed, patient examined, no change in status, stable for surgery.  I have reviewed the patient's chart and labs.  Questions were answered to the patient's satisfaction.     [employer]

## 2017-01-24 NOTE — Op Note (Signed)
[facility] Patient Name: Kelly Hebert Procedure Date: 01/24/2017 11:19 AM MRN: [id number] Date of Birth: [birth year] Attending MD: Barney Drain MD, MD CSN: [id number] Age: 77 Admit Type: Outpatient Procedure:                Upper GI endoscopy with BALLOON DILATION AND                            ESOPHAGEAL DILATION Indications:              Dyspepsia, Dysphagia Providers:                Barney Drain MD, MD, Lurline Del, RN, Charlsie Quest.                            Theda Sers RN, RN Referring MD:              Medicines:                Meperidine 100 mg IV, Midazolam 5 mg IV Complications:            No immediate complications. Estimated Blood Loss:     Estimated blood loss was minimal. Procedure:                Pre-Anesthesia Assessment:                           - Prior to the procedure, a History and Physical                            was performed, and patient medications and                            allergies were reviewed. The patient's tolerance of                            previous anesthesia was also reviewed. The risks                            and benefits of the procedure and the sedation                            options and risks were discussed with the patient.                            All questions were answered, and informed consent                            was obtained. Prior Anticoagulants: The patient has                            taken no previous anticoagulant or antiplatelet                            agents. ASA Grade Assessment: II - A patient with  mild systemic disease. After reviewing the risks                            and benefits, the patient was deemed in                            satisfactory condition to undergo the procedure.                            After obtaining informed consent, the endoscope was                            passed under direct vision. Throughout the                            procedure, the  patient's blood pressure, pulse, and                            oxygen saturations were monitored continuously. The                            [id number]) was introduced through the mouth,                            and advanced to the second part of duodenum. The                            upper GI endoscopy was accomplished without                            difficulty. The patient tolerated the procedure                            fairly well. Scope In: 11:52:26 AM Scope Out: 12:08:35 PM Total Procedure Duration: 0 hours 16 minutes 9 seconds  Findings:      One moderate (circumferential scarring or stenosis; an endoscope may       pass) benign-appearing, intrinsic stenosis was found. This measured 1 cm       (inner diameter) and was YES WITH RESISTANCE. A guidewire was placed and       the scope was withdrawn. Dilation was performed with a Savary dilator       with moderate resistance at 10 mm, 11 mm, 12 mm and 12.8 mm. Estimated       blood loss was minimal.      A benign-appearing, intrinsic moderate stenosis was found at the       pylorus. This was non-traversed. A TTS dilator was passed through the       scope. Dilation with a 12-13.5-15 mm pyloric balloon dilator was       performed. The dilation site was examined following endoscope       reinsertion and showed complete resolution of luminal narrowing.       Estimated blood loss was minimal.      The examined duodenum was normal. 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 Moderate Sedation:      Moderate (conscious) sedation was administered by the endoscopy nurse       and supervised by the endoscopist. The following parameters were       monitored: oxygen saturation, heart rate, blood pressure, and response       to care. Total physician intraservice time was 43 minutes. Recommendation:           - Mechanical  soft diet.                           - Continue present medications.                           - Repeat upper endoscopy in 3 weeks for retreatment.                           - Return to my office in 4 months.                           - Patient has a contact number available for                            emergencies. The signs and symptoms of potential                            delayed complications were discussed with the                            patient. Return to normal activities tomorrow.                            Written discharge instructions were provided to the                            patient. Procedure Code(s):        --- Professional ---                           [phone], Esophagogastroduodenoscopy, flexible,                            transoral; with dilation of gastric/duodenal                            stricture(s) (eg, balloon, bougie)                           43248, Esophagogastroduodenoscopy, flexible,                            transoral; with insertion of guide  wire followed by                            passage of dilator(s) through esophagus over guide                            wire                           99152, Moderate sedation services provided by the                            same physician or other qualified health care                            professional performing the diagnostic or                            therapeutic service that the sedation supports,                            requiring the presence of an independent trained                            observer to assist in the monitoring of the                            patient's level of consciousness and physiological                            status; initial 15 minutes of intraservice time,                            patient age 79 years or older                           [phone], Moderate sedation services; each additional                            15 minutes intraservice time                            99153, Moderate sedation services; each additional                            15 minutes intraservice time Diagnosis Code(s):        --- Professional ---                           K22.2, Esophageal obstruction                           K31.1, Adult hypertrophic pyloric stenosis                           R10.13, Epigastric pain  R13.10, Dysphagia, unspecified CPT copyright 2016 [employer]. All rights reserved. The codes documented in this report are preliminary and upon coder review may  be revised to meet current compliance requirements. Barney Drain, MD Barney Drain MD, MD 01/24/2017 12:27:56 PM This report has been signed electronically. Number of Addenda: 0

## 2017-01-28 ENCOUNTER — Encounter (HOSPITAL_COMMUNITY): Payer: Self-pay | Admitting: Gastroenterology

## 2017-02-07 ENCOUNTER — Other Ambulatory Visit: Payer: Self-pay | Admitting: Gastroenterology

## 2017-02-08 ENCOUNTER — Ambulatory Visit (HOSPITAL_COMMUNITY)
Admission: RE | Admit: 2017-02-08 | Discharge: 2017-02-08 | Disposition: A | Discharge status: Home / Self Care | Payer: Medicare Other | Source: Ambulatory Visit | Attending: Gastroenterology | Admitting: Gastroenterology

## 2017-02-08 ENCOUNTER — Other Ambulatory Visit: Payer: Self-pay

## 2017-02-08 ENCOUNTER — Encounter (HOSPITAL_COMMUNITY)
Admission: RE | Disposition: A | Discharge status: Home / Self Care | Payer: Self-pay | Source: Ambulatory Visit | Attending: Gastroenterology

## 2017-02-08 ENCOUNTER — Encounter (HOSPITAL_COMMUNITY): Payer: Self-pay | Admitting: *Deleted

## 2017-02-08 DIAGNOSIS — E039 Hypothyroidism, unspecified: Secondary | ICD-10-CM | POA: Insufficient documentation

## 2017-02-08 DIAGNOSIS — Z888 Allergy status to other drugs, medicaments and biological substances status: Secondary | ICD-10-CM | POA: Insufficient documentation

## 2017-02-08 DIAGNOSIS — K2970 Gastritis, unspecified, without bleeding: Secondary | ICD-10-CM | POA: Diagnosis not present

## 2017-02-08 DIAGNOSIS — Z91041 Radiographic dye allergy status: Secondary | ICD-10-CM | POA: Diagnosis not present

## 2017-02-08 DIAGNOSIS — E559 Vitamin D deficiency, unspecified: Secondary | ICD-10-CM | POA: Diagnosis not present

## 2017-02-08 DIAGNOSIS — E7800 Pure hypercholesterolemia, unspecified: Secondary | ICD-10-CM | POA: Diagnosis not present

## 2017-02-08 DIAGNOSIS — J449 Chronic obstructive pulmonary disease, unspecified: Secondary | ICD-10-CM | POA: Diagnosis not present

## 2017-02-08 DIAGNOSIS — M109 Gout, unspecified: Secondary | ICD-10-CM | POA: Diagnosis not present

## 2017-02-08 DIAGNOSIS — Z7982 Long term (current) use of aspirin: Secondary | ICD-10-CM | POA: Diagnosis not present

## 2017-02-08 DIAGNOSIS — K317 Polyp of stomach and duodenum: Secondary | ICD-10-CM | POA: Diagnosis not present

## 2017-02-08 DIAGNOSIS — Z79899 Other long term (current) drug therapy: Secondary | ICD-10-CM | POA: Insufficient documentation

## 2017-02-08 DIAGNOSIS — I10 Essential (primary) hypertension: Secondary | ICD-10-CM | POA: Diagnosis not present

## 2017-02-08 DIAGNOSIS — K222 Esophageal obstruction: Secondary | ICD-10-CM | POA: Diagnosis not present

## 2017-02-08 DIAGNOSIS — K219 Gastro-esophageal reflux disease without esophagitis: Secondary | ICD-10-CM | POA: Diagnosis not present

## 2017-02-08 DIAGNOSIS — F419 Anxiety disorder, unspecified: Secondary | ICD-10-CM | POA: Insufficient documentation

## 2017-02-08 DIAGNOSIS — F329 Major depressive disorder, single episode, unspecified: Secondary | ICD-10-CM | POA: Diagnosis not present

## 2017-02-08 DIAGNOSIS — R1310 Dysphagia, unspecified: Secondary | ICD-10-CM

## 2017-02-08 DIAGNOSIS — R011 Cardiac murmur, unspecified: Secondary | ICD-10-CM | POA: Insufficient documentation

## 2017-02-08 DIAGNOSIS — Z8542 Personal history of malignant neoplasm of other parts of uterus: Secondary | ICD-10-CM | POA: Diagnosis not present

## 2017-02-08 HISTORY — PX: SAVORY DILATION: SHX5439

## 2017-02-08 HISTORY — PX: ESOPHAGOGASTRODUODENOSCOPY: SHX5428

## 2017-02-08 SURGERY — EGD (ESOPHAGOGASTRODUODENOSCOPY)
Anesthesia: Moderate Sedation

## 2017-02-08 MED ORDER — MEPERIDINE HCL 100 MG/ML IJ SOLN
INTRAMUSCULAR | Status: DC | PRN
  Administered 2017-02-08: 25 mg via INTRAVENOUS
  Administered 2017-02-08: 50 mg via INTRAVENOUS

## 2017-02-08 MED ORDER — SIMETHICONE 40 MG/0.6ML PO SUSP
ORAL | Status: DC | PRN
  Administered 2017-02-08: 2.5 mL

## 2017-02-08 MED ORDER — ONDANSETRON HCL 4 MG/2ML IJ SOLN
4.0000 mg | Freq: Once | INTRAMUSCULAR | Status: AC
  Administered 2017-02-08: 4 mg via INTRAVENOUS

## 2017-02-08 MED ORDER — MEPERIDINE HCL 100 MG/ML IJ SOLN
INTRAMUSCULAR | Status: AC
  Filled 2017-02-08: qty 2

## 2017-02-08 MED ORDER — MIDAZOLAM HCL 5 MG/5ML IJ SOLN
INTRAMUSCULAR | Status: DC | PRN
  Administered 2017-02-08: 2 mg via INTRAVENOUS
  Administered 2017-02-08 (×3): 1 mg via INTRAVENOUS
  Administered 2017-02-08: 2 mg via INTRAVENOUS

## 2017-02-08 MED ORDER — ONDANSETRON HCL 4 MG/2ML IJ SOLN
INTRAMUSCULAR | Status: AC
  Filled 2017-02-08: qty 2

## 2017-02-08 MED ORDER — MINERAL OIL PO OIL
TOPICAL_OIL | ORAL | Status: AC
  Filled 2017-02-08: qty 30

## 2017-02-08 MED ORDER — SODIUM CHLORIDE 0.9 % IV SOLN
INTRAVENOUS | Status: DC
  Administered 2017-02-08: 12:00:00 via INTRAVENOUS

## 2017-02-08 MED ORDER — MIDAZOLAM HCL 5 MG/5ML IJ SOLN
INTRAMUSCULAR | Status: AC
  Filled 2017-02-08: qty 10

## 2017-02-08 MED ORDER — LIDOCAINE VISCOUS 2 % MT SOLN
OROMUCOSAL | Status: AC
  Filled 2017-02-08: qty 15

## 2017-02-08 NOTE — Discharge Instructions (Signed)
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  Your esophagus was still very tight. I COULD NOT COMPLETE YOUR DILATION TODAY. You have gastritis and stomach polyps.   DRINK WATER TO KEEP YOUR URINE LIGHT YELLOW.  FOLLOW A SOFT MECHANICAL DIET.  MEATS SHOULD BE GROUND ONLY. DO NOT EAT CHUNKS OF ANYTHING. SEE INFO BELOW.  CONTINUE DEXILANT  DAILY.  USE PEPCID OR ZANTAC IF NEEDED TO CONTROL ACID REFLUX.  REPEAT EGD TO COMPLETE THE ESOPHAGEAL DILATION IN 4 WEEKS.  FOLLOW UP APPT IN 6 MONTHS.    UPPER ENDOSCOPY AFTER CARE Read the instructions outlined below and refer to this sheet in the next week. These discharge instructions provide you with general information on caring for yourself after you leave the hospital. While your treatment has been planned according to the most current medical practices available, unavoidable complications occasionally occur. If you have any problems or questions after discharge, call DR. Javid Kemler, [phone].  ACTIVITY  You may resume your regular activity, but move at a slower pace for the next 24 hours.   Take frequent rest periods for the next 24 hours.   Walking will help get rid of the air and reduce the bloated feeling in your belly (abdomen).   No driving for 24 hours (because of the medicine (anesthesia) used during the test).   You may shower.   Do not sign any important legal documents or operate any machinery for 24 hours (because of the anesthesia used during the test).    NUTRITION  Drink plenty of fluids.   You may resume your normal diet as instructed by your doctor.   Begin with a light meal and progress to your normal diet. Heavy or fried foods are harder to digest and may make you feel sick to your stomach (nauseated).   Avoid alcoholic beverages for 24 hours or as instructed.    MEDICATIONS  You may resume your normal medications.   WHAT YOU CAN EXPECT TODAY  Some feelings of bloating in the abdomen.    Passage of more gas than usual.    IF YOU HAD A BIOPSY TAKEN DURING THE UPPER ENDOSCOPY:  Eat a soft diet IF YOU HAVE NAUSEA, BLOATING, ABDOMINAL PAIN, OR VOMITING.    FINDING OUT THE RESULTS OF YOUR TEST Not all test results are available during your visit. DR. Oneida Alar WILL CALL YOU WITHIN 14 DAYS OF YOUR PROCEDUE WITH YOUR RESULTS. Do not assume everything is normal if you have not heard from DR. Mae Denunzio IN 14 DAYS, CALL HER OFFICE AT [phone].  SEEK IMMEDIATE MEDICAL ATTENTION AND CALL THE OFFICE: [phone] IF:  You have more than a spotting of blood in your stool.   Your belly is swollen (abdominal distention).   You are nauseated or vomiting.   You have a temperature over 101F.   You have abdominal pain or discomfort that is severe or gets worse throughout the day.    SOFT MECHANICAL DIET This SOFT MECHANICAL DIET is restricted to:  Foods that are moist, soft-textured, and easy to chew and swallow.   Meats that are ground or are minced no larger than one-quarter inch pieces. Meats are moist with gravy or sauce added.   Foods that do not include bread or bread-like textures except soft pancakes, well-moistened with syrup or sauce.   Textures with some chewing ability required.   Casseroles without rice.   Cooked vegetables that are less than half an inch in size and easily mashed with a  fork. No cooked corn, peas, broccoli, cauliflower, cabbage, Brussels sprouts, asparagus, or other fibrous, non-tender or rubbery cooked vegetables.   Canned fruit except for pineapple. Fruit must be cut into pieces no larger than half an inch in size.   Foods that do not include nuts, seeds, coconut, or sticky textures.   FOOD TEXTURES FOR DYSPHAGIA DIET LEVEL 2 -SOFT MECHANICAL DIET (includes all foods on Dysphagia Diet Level 1 - Pureed, in addition to the foods listed below)  FOOD GROUP: Breads. RECOMMENDED: Soft pancakes, well-moistened with syrup or sauce.  AVOID: All  others.  FOOD GROUP: Cereals.  RECOMMENDED: Cooked cereals with little texture, including oatmeal. Unprocessed wheat bran stirred into cereals for bulk. Note: If thin liquids are restricted, it is important that all of the liquid is absorbed into the cereal.  AVOID: All dry cereals and any cooked cereals that may contain flax seeds or other seeds or nuts. Whole-grain, dry, or coarse cereals. Cereals with nuts, seeds, dried fruit, and/or coconut.  FOOD GROUP: Desserts. RECOMMENDED: Pudding, custard. Soft fruit pies with bottom crust only. Canned fruit (excluding pineapple). Soft, moist cakes with icing.Frozen malts, milk shakes, frozen yogurt, eggnog, nutritional supplements, ice cream, sherbet, regular or sugar-free gelatin, or any foods that become thin liquid at either room (70 F) or body temperature (98 F).  AVOID: Dry, coarse cakes and cookies. Anything with nuts, seeds, coconut, pineapple, or dried fruit. Breakfast yogurt with nuts. Rice or bread pudding.  FOOD GROUP: Fats. RECOMMENDED: Butter, margarine, cream for cereal (depending on liquid consistency recommendations), gravy, cream sauces, sour cream, sour cream dips with soft additives, mayonnaise, salad dressings, cream cheese, cream cheese spreads with soft additives, whipped toppings.  AVOID: All fats with coarse or chunky additives.  FOOD GROUP: Fruits. RECOMMENDED: Soft drained, canned, or cooked fruits without seeds or skin. Fresh soft and ripe banana. Fruit juices with a small amount of pulp. If thin liquids are restricted, fruit juices should be thickened to appropriate consistency.  AVOID: Fresh or frozen fruits. Cooked fruit with skin or seeds. Dried fruits. Fresh, canned, or cooked pineapple.  FOOD GROUP: Meats and Meat Substitutes. (Meat pieces should not exceed 1/4 of an inch cube and should be tender.) RECOMMENDED: Moistened ground or cooked meat, poultry, or fish. Moist ground or tender meat may be served with  gravy or sauce. Casseroles without rice. Moist macaroni and cheese, well-cooked pasta with meat sauce, tuna noodle casserole, soft, moist lasagna. Moist meatballs, meatloaf, or fish loaf. Protein salads, such as tuna or egg without large chunks, celery, or onion. Cottage cheese, smooth quiche without large chunks. Poached, scrambled, or soft-cooked eggs (egg yolks should not be runny but should be moist and able to be mashed with butter, margarine, or other moisture added to them). (Cook eggs to 160 F or use pasteurized eggs for safety.) Souffls may have small, soft chunks. Tofu. Well-cooked, slightly mashed, moist legumes, such as baked beans. All meats or protein substitutes should be served with sauces or moistened to help maintain cohesiveness in the oral cavity.  AVOID: Dry meats, tough meats (such as bacon, sausage, hot dogs, bratwurst). Dry casseroles or casseroles with rice or large chunks. Peanut butter. Cheese slices and cubes. Hard-cooked or crisp fried eggs. Sandwiches.Pizza.  FOOD GROUP: Potatoes and Starches. RECOMMENDED: Well-cooked, moistened, boiled, baked, or mashed potatoes. Well-cooked shredded hash brown potatoes that are not crisp. (All potatoes need to be moist and in sauces.)Well-cooked noodles in sauce. Spaetzel or soft dumplings that have been moistened with butter  or gravy.  AVOID: Potato skins and chips. Fried or French-fried potatoes. Rice.  FOOD GROUP: Soups. RECOMMENDED: Soups with easy-to-chew or easy-to-swallow meats or vegetables: Particle sizes in soups should be less than 1/2 inch. Soups will need to be thickened to appropriate consistency if soup is thinner than prescribed liquid consistency.  AVOID: Soups with large chunks of meat and vegetables. Soups with rice, corn, peas.  FOOD GROUP: Vegetables. RECOMMENDED: All soft, well-cooked vegetables. Vegetables should be less than a half inch. Should be easily mashed with a fork.  AVOID: Cooked corn and peas.  Broccoli, cabbage, Brussels sprouts, asparagus, or other fibrous, non-tender or rubbery cooked vegetables.  FOOD GROUP: Miscellaneous. RECOMMENDED: Jams and preserves without seeds, jelly. Sauces, salsas, etc., that may have small tender chunks less than 1/2 inch. Soft, smooth chocolate bars that are easily chewed.  AVOID: Seeds, nuts, coconut, or sticky foods. Chewy candies such as caramels or licorice.

## 2017-02-08 NOTE — Interval H&P Note (Signed)
History and Physical Interval Note:  02/08/2017 12:48 PM  Kelly Hebert  has presented today for surgery, with the diagnosis of dysphagia  The various methods of treatment have been discussed with the patient and family. After consideration of risks, benefits and other options for treatment, the patient has consented to  Procedure(s) with comments: ESOPHAGOGASTRODUODENOSCOPY (EGD) (N/A) - 12:45pm SAVORY DILATION (N/A) as a surgical intervention .  The patient's history has been reviewed, patient examined, no change in status, stable for surgery.  I have reviewed the patient's chart and labs.  Questions were answered to the patient's satisfaction.     [employer]

## 2017-02-08 NOTE — Op Note (Signed)
[facility] Patient Name: Kelly Hebert Procedure Date: 02/08/2017 12:25 PM MRN: [id number] Date of Birth: 06/19/1940 Attending MD: Barney Drain MD, MD CSN: [id number] Age: 77 Admit Type: Outpatient Procedure:                Upper GI endoscopy WITH ESOPHAGEAL DILATION Indications:              Dysphagia Providers:                Barney Drain MD, MD, Janeece Riggers, RN, Lurline Del,                            RN, Nelma Rothman, Technician, Randa Spike,                            Technician Referring MD:              Medicines:                Ondansetron 4 mg IV, Meperidine 100 mg IV,                            Midazolam 7 mg IV Complications:            No immediate complications. Estimated Blood Loss:     Estimated blood loss was minimal. Procedure:                Pre-Anesthesia Assessment:                           - Prior to the procedure, a History and Physical                            was performed, and patient medications and                            allergies were reviewed. The patient's tolerance of                            previous anesthesia was also reviewed. The risks                            and benefits of the procedure and the sedation                            options and risks were discussed with the patient.                            All questions were answered, and informed consent                            was obtained. Prior Anticoagulants: The patient has                            taken aspirin, last dose was 5 days prior to  procedure. ASA Grade Assessment: II - A patient                            with mild systemic disease. After reviewing the                            risks and benefits, the patient was deemed in                            satisfactory condition to undergo the procedure.                            After obtaining informed consent, the endoscope was                            passed under direct vision.  Throughout the                            procedure, the patient's blood pressure, pulse, and                            oxygen saturations were monitored continuously. The                            EG-[id number] ([id number]) scope was introduced through the                            mouth, and advanced to the second part of duodenum.                            The upper GI endoscopy was somewhat difficult due                            to stricture. The patient tolerated the procedure                            fairly well. Scope In: 1:10:47 PM Scope Out: 1:17:15 PM Total Procedure Duration: 0 hours 6 minutes 28 seconds  Findings:      One moderate (circumferential scarring or stenosis; an endoscope may       pass) benign-appearing, intrinsic stenosis was found. This measured 1.2       cm (inner diameter) and was traversed. A guidewire was placed and the       scope was withdrawn. Dilation was performed with a Savary dilator with       moderate resistance at 12.8 mm, 14 mm and 15 mm. Estimated blood loss       was minimal.      Patchy mild inflammation characterized by erythema was found in the       gastric antrum.      Multiple sessile polyps with no stigmata of recent bleeding were found       in the gastric fundus and in the gastric body.      The examined duodenum was normal. Impression:               -  Benign-appearing esophageal stenosis. Dilated.                           - MILD Gastritis.                           - Multiple gastric polyps. Moderate Sedation:      Moderate (conscious) sedation was administered by the endoscopy nurse       and supervised by the endoscopist. The following parameters were       monitored: oxygen saturation, heart rate, blood pressure, and response       to care. Total physician intraservice time was 26 minutes. Recommendation:           - Soft diet.                           - Continue present medications.                           - Repeat upper  endoscopy in 1 month for retreatment.                           - Return to GI office in 6 months.                           - Patient has a contact number available for                            emergencies. The signs and symptoms of potential                            delayed complications were discussed with the                            patient. Return to normal activities tomorrow.                            Written discharge instructions were provided to the                            patient. Procedure Code(s):        --- Professional ---                           [phone], Esophagogastroduodenoscopy, flexible,                            transoral; with insertion of guide wire followed by                            passage of dilator(s) through esophagus over guide                            wire                           99152, Moderate  sedation services provided by the                            same physician or other qualified health care                            professional performing the diagnostic or                            therapeutic service that the sedation supports,                            requiring the presence of an independent trained                            observer to assist in the monitoring of the                            patient's level of consciousness and physiological                            status; initial 15 minutes of intraservice time,                            patient age 36 years or older                           [id number], Moderate sedation services; each additional                            15 minutes intraservice time Diagnosis Code(s):        --- Professional ---                           K22.2, Esophageal obstruction                           K29.70, Gastritis, unspecified, without bleeding                           K31.7, Polyp of stomach and duodenum                           R13.10, Dysphagia, unspecified CPT copyright 2016  American Medical Association. All rights reserved. The codes documented in this report are preliminary and upon coder review may  be revised to meet current compliance requirements. Barney Drain, MD Barney Drain MD, MD 02/08/2017 1:25:28 PM This report has been signed electronically. Number of Addenda: 0

## 2017-02-11 ENCOUNTER — Encounter (HOSPITAL_COMMUNITY): Payer: Self-pay | Admitting: Gastroenterology

## 2017-03-07 ENCOUNTER — Telehealth: Payer: Self-pay | Admitting: Gastroenterology

## 2017-03-07 NOTE — Telephone Encounter (Signed)
Triage for EGD/DIL AFTER MAR 18.

## 2017-03-07 NOTE — Telephone Encounter (Signed)
lmovm

## 2017-03-07 NOTE — Telephone Encounter (Signed)
Pt had EGD on 02/08/2017 and SF recommendations were to follow up in 6 months and repeat EGD in one month. Does pt need OV prior to scheduling repeat EGD? Please advise.

## 2017-03-08 ENCOUNTER — Encounter: Payer: Self-pay | Admitting: *Deleted

## 2017-03-08 ENCOUNTER — Other Ambulatory Visit: Payer: Self-pay | Admitting: *Deleted

## 2017-03-08 DIAGNOSIS — R1310 Dysphagia, unspecified: Secondary | ICD-10-CM

## 2017-03-08 NOTE — Telephone Encounter (Signed)
LMOVM

## 2017-03-08 NOTE — Telephone Encounter (Signed)
Spoke with pt. She has not started any new meds. She is scheduled for 04/12/17 at 1:30pm. Instructions discussed and mailed to pt.

## 2017-04-12 ENCOUNTER — Encounter (HOSPITAL_COMMUNITY)
Admission: RE | Disposition: A | Discharge status: Home / Self Care | Payer: Self-pay | Source: Ambulatory Visit | Attending: Gastroenterology

## 2017-04-12 ENCOUNTER — Ambulatory Visit (HOSPITAL_COMMUNITY)
Admission: RE | Admit: 2017-04-12 | Discharge: 2017-04-12 | Disposition: A | Discharge status: Home / Self Care | Payer: Medicare Other | Source: Ambulatory Visit | Attending: Gastroenterology | Admitting: Gastroenterology

## 2017-04-12 ENCOUNTER — Encounter (HOSPITAL_COMMUNITY): Payer: Self-pay | Admitting: *Deleted

## 2017-04-12 ENCOUNTER — Other Ambulatory Visit: Payer: Self-pay

## 2017-04-12 DIAGNOSIS — K2970 Gastritis, unspecified, without bleeding: Secondary | ICD-10-CM | POA: Diagnosis not present

## 2017-04-12 DIAGNOSIS — M109 Gout, unspecified: Secondary | ICD-10-CM | POA: Diagnosis not present

## 2017-04-12 DIAGNOSIS — E039 Hypothyroidism, unspecified: Secondary | ICD-10-CM | POA: Insufficient documentation

## 2017-04-12 DIAGNOSIS — K222 Esophageal obstruction: Secondary | ICD-10-CM | POA: Diagnosis not present

## 2017-04-12 DIAGNOSIS — Z79899 Other long term (current) drug therapy: Secondary | ICD-10-CM | POA: Insufficient documentation

## 2017-04-12 DIAGNOSIS — F419 Anxiety disorder, unspecified: Secondary | ICD-10-CM | POA: Diagnosis not present

## 2017-04-12 DIAGNOSIS — Z8542 Personal history of malignant neoplasm of other parts of uterus: Secondary | ICD-10-CM | POA: Diagnosis not present

## 2017-04-12 DIAGNOSIS — R1310 Dysphagia, unspecified: Secondary | ICD-10-CM | POA: Diagnosis not present

## 2017-04-12 DIAGNOSIS — K317 Polyp of stomach and duodenum: Secondary | ICD-10-CM | POA: Insufficient documentation

## 2017-04-12 DIAGNOSIS — E559 Vitamin D deficiency, unspecified: Secondary | ICD-10-CM | POA: Insufficient documentation

## 2017-04-12 DIAGNOSIS — K219 Gastro-esophageal reflux disease without esophagitis: Secondary | ICD-10-CM | POA: Diagnosis not present

## 2017-04-12 DIAGNOSIS — F329 Major depressive disorder, single episode, unspecified: Secondary | ICD-10-CM | POA: Diagnosis not present

## 2017-04-12 DIAGNOSIS — J449 Chronic obstructive pulmonary disease, unspecified: Secondary | ICD-10-CM | POA: Insufficient documentation

## 2017-04-12 DIAGNOSIS — E7800 Pure hypercholesterolemia, unspecified: Secondary | ICD-10-CM | POA: Insufficient documentation

## 2017-04-12 DIAGNOSIS — I10 Essential (primary) hypertension: Secondary | ICD-10-CM | POA: Insufficient documentation

## 2017-04-12 HISTORY — PX: SAVORY DILATION: SHX5439

## 2017-04-12 HISTORY — PX: ESOPHAGOGASTRODUODENOSCOPY: SHX5428

## 2017-04-12 SURGERY — EGD (ESOPHAGOGASTRODUODENOSCOPY)
Anesthesia: Moderate Sedation

## 2017-04-12 MED ORDER — SODIUM CHLORIDE 0.9 % IV SOLN
INTRAVENOUS | Status: DC
  Administered 2017-04-12: 11:00:00 via INTRAVENOUS

## 2017-04-12 MED ORDER — MIDAZOLAM HCL 5 MG/5ML IJ SOLN
INTRAMUSCULAR | Status: AC
  Filled 2017-04-12: qty 10

## 2017-04-12 MED ORDER — STERILE WATER FOR IRRIGATION IR SOLN
Status: DC | PRN
  Administered 2017-04-12: 100 mL

## 2017-04-12 MED ORDER — MEPERIDINE HCL 100 MG/ML IJ SOLN
INTRAMUSCULAR | Status: AC
  Filled 2017-04-12: qty 2

## 2017-04-12 MED ORDER — MINERAL OIL PO OIL
TOPICAL_OIL | ORAL | Status: AC
  Filled 2017-04-12: qty 30

## 2017-04-12 MED ORDER — MEPERIDINE HCL 100 MG/ML IJ SOLN
INTRAMUSCULAR | Status: DC | PRN
  Administered 2017-04-12: 25 mg via INTRAVENOUS
  Administered 2017-04-12: 50 mg via INTRAVENOUS

## 2017-04-12 MED ORDER — ONDANSETRON HCL 4 MG/2ML IJ SOLN
INTRAMUSCULAR | Status: DC | PRN
  Administered 2017-04-12: 4 mg via INTRAVENOUS

## 2017-04-12 MED ORDER — ONDANSETRON HCL 4 MG/2ML IJ SOLN
INTRAMUSCULAR | Status: AC
  Filled 2017-04-12: qty 2

## 2017-04-12 MED ORDER — LIDOCAINE VISCOUS 2 % MT SOLN
OROMUCOSAL | Status: DC | PRN
  Administered 2017-04-12: 4 mL via OROMUCOSAL

## 2017-04-12 MED ORDER — LIDOCAINE VISCOUS 2 % MT SOLN
OROMUCOSAL | Status: AC
  Filled 2017-04-12: qty 15

## 2017-04-12 MED ORDER — MIDAZOLAM HCL 5 MG/5ML IJ SOLN
INTRAMUSCULAR | Status: DC | PRN
  Administered 2017-04-12 (×2): 2 mg via INTRAVENOUS
  Administered 2017-04-12: 1 mg via INTRAVENOUS

## 2017-04-12 NOTE — Discharge Instructions (Signed)
I dilated your esophagus. You have a stricture near the base of your esophagus.  You have mild gastritis & GASTRIC POLYPS.    DRINK WATER TO KEEP YOUR URINE LIGHT YELLOW.  FOLLOW A LOW FAT DIET. MEATS SHOULD BE BAKED, BROILED, OR BOILED. AVOID FRIED FOODS.  CONTINUE DEXILANT.  FOLLOW UP IN 4 MOS.  UPPER ENDOSCOPY AFTER CARE Read the instructions outlined below and refer to this sheet in the next week. These discharge instructions provide you with general information on caring for yourself after you leave the hospital. While your treatment has been planned according to the most current medical practices available, unavoidable complications occasionally occur. If you have any problems or questions after discharge, call DR. Dalyn Kjos, [phone].  ACTIVITY  You may resume your regular activity, but move at a slower pace for the next 24 hours.   Take frequent rest periods for the next 24 hours.   Walking will help get rid of the air and reduce the bloated feeling in your belly (abdomen).   No driving for 24 hours (because of the medicine (anesthesia) used during the test).   You may shower.   Do not sign any important legal documents or operate any machinery for 24 hours (because of the anesthesia used during the test).    NUTRITION  Drink plenty of fluids.   You may resume your normal diet as instructed by your doctor.   Begin with a light meal and progress to your normal diet. Heavy or fried foods are harder to digest and may make you feel sick to your stomach (nauseated).   Avoid alcoholic beverages for 24 hours or as instructed.    MEDICATIONS  You may resume your normal medications.   WHAT YOU CAN EXPECT TODAY  Some feelings of bloating in the abdomen.   Passage of more gas than usual.    IF YOU HAD A BIOPSY TAKEN DURING THE UPPER ENDOSCOPY:  Eat a soft diet IF YOU HAVE NAUSEA, BLOATING, ABDOMINAL PAIN, OR VOMITING.    FINDING OUT THE RESULTS OF YOUR  TEST Not all test results are available during your visit. DR. Oneida Alar WILL CALL YOU WITHIN 7 DAYS OF YOUR PROCEDUE WITH YOUR RESULTS. Do not assume everything is normal if you have not heard from DR. Belenda Alviar IN ONE WEEK, CALL HER OFFICE AT [phone].  SEEK IMMEDIATE MEDICAL ATTENTION AND CALL THE OFFICE: [phone] IF:  You have more than a spotting of blood in your stool.   Your belly is swollen (abdominal distention).   You are nauseated or vomiting.   You have a temperature over 101F.   You have abdominal pain or discomfort that is severe or gets worse throughout the day.  ESOPHAGEAL STRICTURE  Esophageal strictures can be caused by stomach acid backing up into the tube that carries food from the mouth down to the stomach (lower esophagus).  TREATMENT There are a number of medicines used to treat reflux/stricture, including: Antacids.  Proton-pump inhibitors: DEXILANT PEPCID OR ZANTAC  HOME CARE INSTRUCTIONS Eat 2-3 hours before going to bed.  Try to reach and maintain a healthy weight.  Do not eat just a few very large meals. Instead, eat 4 TO 6 smaller meals throughout the day.  Try to identify foods and beverages that make your symptoms worse, and avoid these.  Avoid tight clothing.  Do not exercise right after eating.

## 2017-04-12 NOTE — Op Note (Signed)
[facility] Patient Name: Kelly Hebert Procedure Date: 04/12/2017 12:02 PM MRN: [id number] Date of Birth: 01-Sep-1940 Attending MD: Barney Drain MD, MD CSN: [id number] Age: 77 Admit Type: Outpatient Procedure:                Upper GI endoscopy Indications:              Dysphagia Providers:                Barney Drain MD, MD, Rosina Lowenstein, RN, Aram Candela Referring MD:              Medicines:                Ondansetron 4 mg IV, Meperidine 75 mg IV, Midazolam                            5 mg IV Complications:            No immediate complications. Estimated Blood Loss:     Estimated blood loss was minimal. Procedure:                Pre-Anesthesia Assessment:                           - Prior to the procedure, a History and Physical                            was performed, and patient medications and                            allergies were reviewed. The patient's tolerance of                            previous anesthesia was also reviewed. The risks                            and benefits of the procedure and the sedation                            options and risks were discussed with the patient.                            All questions were answered, and informed consent                            was obtained. Prior Anticoagulants: The patient has                            taken aspirin, last dose was 1 day prior to                            procedure. ASA Grade Assessment: II - A patient                            with mild systemic disease. After reviewing the  risks and benefits, the patient was deemed in                            satisfactory condition to undergo the procedure.                            After obtaining informed consent, the endoscope was                            passed under direct vision. Throughout the                            procedure, the patient's blood pressure, pulse, and                            oxygen  saturations were monitored continuously. The                            EG-[id number] ([id number]) scope was introduced through the                            mouth, and advanced to the second part of duodenum.                            The upper GI endoscopy was technically difficult                            and complex due to stricture. Successful completion                            of the procedure was aided by increasing the dose                            of sedation medication. The patient tolerated the                            procedure fairly well. Scope In: 1:03:48 PM Scope Out: 1:10:34 PM Total Procedure Duration: 0 hours 6 minutes 46 seconds  Findings:      One moderate (circumferential scarring or stenosis; an endoscope may       pass) benign-appearing, intrinsic stenosis was found. This measured 1.4       cm (inner diameter) and was traversed. A guidewire was placed and the       scope was withdrawn. Dilation was performed with a Savary dilator with       moderate resistance at 15 mm, 16 mm and 17 mm. Estimated blood loss was       minimal.      Multiple 3 to 15 mm sessile polyps were found in the gastric fundus and       in the gastric body.      Patchy mild inflammation characterized by congestion (edema) and       erythema was found in the gastric antrum.      The examined duodenum was normal. 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                           - MILD Gastritis. Moderate Sedation:      Moderate (conscious) sedation was administered by the endoscopy nurse       and supervised by the endoscopist. The following parameters were       monitored: oxygen saturation, heart rate, blood pressure, and response       to care. Total physician intraservice time was 20 minutes. Recommendation:           - Low fat diet.                            - Continue present medications.                           - Return to my office in 4 months.                           - Patient has a contact number available for                            emergencies. The signs and symptoms of potential                            delayed complications were discussed with the                            patient. Return to normal activities tomorrow.                            Written discharge instructions were provided to the                            patient. Procedure Code(s):        --- Professional ---                           [phone], Esophagogastroduodenoscopy, flexible,                            transoral; with insertion of guide wire followed by                            passage of dilator(s) through esophagus over guide                            wire                           99152, Moderate sedation services provided by  the                            same physician or other qualified health care                            professional performing the diagnostic or                            therapeutic service that the sedation supports,                            requiring the presence of an independent trained                            observer to assist in the monitoring of the                            patient's level of consciousness and physiological                            status; initial 15 minutes of intraservice time,                            patient age 45 years or older Diagnosis Code(s):        --- Professional ---                           K22.2, Esophageal obstruction                           K31.7, Polyp of stomach and duodenum                           K29.70, Gastritis, unspecified, without bleeding                           R13.10, Dysphagia, unspecified CPT copyright 2016 American Medical Association. All rights reserved. The codes documented in this report are preliminary and upon coder review may  be revised to  meet current compliance requirements. Barney Drain, MD Barney Drain MD, MD 04/12/2017 1:18:32 PM This report has been signed electronically. Number of Addenda: 0

## 2017-04-12 NOTE — H&P (Signed)
Primary Care Physician:  [name], NP Primary Gastroenterologist:  Dr. Oneida Alar  Pre-Procedure History & Physical: HPI:  Kelly Hebert is a 77 y.o. female here for DYSPHAGIA.  Past Medical History:  Diagnosis Date  . Acid reflux   . Anxiety   . Cancer [facility])    endometrial cancer  . Constipation   . COPD (chronic obstructive pulmonary disease) ([place])   . Depression   . Gout   . Heart murmur   . Hx of pyloric stenosis 11/14/2011  . Hypercholesteremia   . Hyperglycemia   . Hypertension   . Hypothyroidism   . Renal disorder    pelvic kidney  . S/P colonoscopy 2003   Dr. Nicolasa Ducking: normal colon, normal path.   . Vitamin D deficiency disease     Past Surgical History:  Procedure Laterality Date  . ABDOMINAL HYSTERECTOMY     complete  . BREAST SURGERY     breast biopsy   . COLONOSCOPY  03/09/2011   GGY:IRSWNIO diverticula, small internal hemorrhoids.   . ESOPHAGOGASTRODUODENOSCOPY   03/09/2011   EVO:JJKKXFGHW in the distal esophagus/ Stenosis at the pylorus/ Mild gastritis/ Hiatal hernia. gastric bx showed gastritis but no H.pylori  . ESOPHAGOGASTRODUODENOSCOPY  03/20/11   SLF: stricture distal esophgus and stenosis at pyloris likely from NSAIDs. s/p dialtion.   . ESOPHAGOGASTRODUODENOSCOPY N/A 01/24/2017   Procedure: ESOPHAGOGASTRODUODENOSCOPY (EGD);  Surgeon: Danie Binder, MD;  [facility];  Service: Endoscopy;  Laterality: N/A;  10:45am  . ESOPHAGOGASTRODUODENOSCOPY N/A 02/08/2017   Procedure: ESOPHAGOGASTRODUODENOSCOPY (EGD);  Surgeon: Danie Binder, MD;  [facility];  Service: Endoscopy;  Laterality: N/A;  12:45pm  . ESOPHAGOGASTRODUODENOSCOPY (EGD) WITH ESOPHAGEAL DILATION  11/23/2011   SLF: Stricture was found at the gastroesophageal junction/ Small hiatal hernia/  Sessile polyp was found in the gastric body and gastric fundus/ Stenosis was found at the pylorus/ The duodenal mucosa showed no abnormalities in the bulb and second portion of  the duodenum  . ESOPHAGOGASTRODUODENOSCOPY (EGD) WITH ESOPHAGEAL DILATION  12/03/2011   EXH:BZJIR was found in the entire examined stomach/Stricture was found at the gastroesophageal junction  . LAPAROSCOPY  2000   Anon Raices-stomach pain-pt reports negative  . ROTATOR CUFF REPAIR    . SAVORY DILATION N/A 01/24/2017   Procedure: SAVORY DILATION;  Surgeon: Danie Binder, MD;  [facility];  Service: Endoscopy;  Laterality: N/A;  . SAVORY DILATION N/A 02/08/2017   Procedure: SAVORY DILATION;  Surgeon: Danie Binder, MD;  [facility];  Service: Endoscopy;  Laterality: N/A;    Prior to Admission medications   Medication Sig Start Date End Date Taking? Authorizing Provider  albuterol (PROVENTIL HFA;VENTOLIN HFA) 108 (90 BASE) MCG/ACT inhaler Inhale 2 puffs into the lungs every 6 (six) hours as needed for wheezing or shortness of breath.    Yes [provider]  amitriptyline (ELAVIL) 10 MG tablet Take 10 mg by mouth at bedtime.  07/29/12  Yes [provider]  amLODipine (NORVASC) 10 MG tablet Take 10 mg by mouth daily.   Yes [provider]  ARTIFICIAL TEAR OP Place 1 drop into both eyes daily.   Yes [provider]  aspirin EC 81 MG tablet Take 81 mg by mouth daily.   Yes [provider]  atorvastatin (LIPITOR) 40 MG tablet Take 40 mg by mouth every evening.  12/31/16  Yes [provider]  benazepril-hydrochlorthiazide (LOTENSIN HCT) 20-25 MG tablet Take 1 tablet by mouth daily.  10/03/11  Yes [provider]  buPROPion (WELLBUTRIN XL) 150 MG 24 hr tablet Take 150 mg by mouth daily.    Yes [provider]  Chlorpheniramine Maleate (ALLERGY PO) Take 1 tablet by mouth daily as needed (allergies).   Yes [provider]  cholecalciferol (VITAMIN D) 1000 units tablet Take 1,000 Units by mouth daily.   Yes [provider]  citalopram (CELEXA) 40 MG tablet Take 40 mg by mouth daily.     Yes  [provider]  DEXILANT 60 MG capsule TAKE 1 CAPSULE BY MOUTH EVERY DAY Patient taking differently: TAKE 1 CAPSULE BY MOUTH EVERY DAY IN THE EVENING 02/08/17  Yes Carlis Stable, NP  fluticasone (FLOVENT HFA) 110 MCG/ACT inhaler Inhale 2 puffs into the lungs 2 (two) times daily as needed (for shortness of breath).    Yes [provider]  levothyroxine (SYNTHROID, LEVOTHROID) 50 MCG tablet Take 50 mcg by mouth daily.  04/10/12  Yes [provider]  LINZESS 145 MCG CAPS capsule TAKE 1 CAPSULE BY MOUTH EVERY DAY Patient taking differently: TAKE 145 MCG BY MOUTH DAILY AS NEEDED IBS 05/18/16  Yes Annitta Needs, NP  LORazepam (ATIVAN) 1 MG tablet Take 1 mg by mouth 2 (two) times daily as needed for anxiety.    Yes [provider]  Multiple Vitamins-Calcium (ONE-A-DAY WOMENS PO) Take 1 tablet by mouth daily.   Yes [provider]    Allergies as of 03/08/2017 - Review Complete 02/08/2017  Allergen Reaction Noted  . Ivp dye [iodinated diagnostic agents] Hives and Itching 01/06/2011  . Calcium channel blockers Other (See Comments) 03/02/2014  . Nifedipine Other (See Comments) 03/02/2014    Family History  Problem Relation Age of Onset  . Kidney disease Sister   . Cirrhosis Brother        ?etoh  . Diabetes Brother   . Heart attack Brother   . Heart disease Mother   . Heart attack Mother   . Heart disease Father   . Heart attack Father   . Diabetes Brother   . Cirrhosis Sister        Non-etoh  . Colon cancer Neg Hx     Social History   Socioeconomic History  . Marital status: Legally Separated    Spouse name: Not on file  . Number of children: 3  . Years of education: Not on file  . Highest education level: Not on file  Occupational History  . Occupation: retired Copy  . Financial resource strain: Not on file  . Food insecurity:    Worry: Not on file    Inability: Not on file  . Transportation needs:    Medical: Not  on file    Non-medical: Not on file  Tobacco Use  . Smoking status: Never Smoker  . Smokeless tobacco: Never Used  Substance and Sexual Activity  . Alcohol use: No  . Drug use: No  . Sexual activity: Not on file  Lifestyle  . Physical activity:    Days per week: Not on file    Minutes per session: Not on file  . Stress: Not on file  Relationships  . Social connections:    Talks on phone: Not on file    Gets together: Not on file    Attends religious service: Not on file    Active member of club or organization: Not on file    Attends meetings of clubs or organizations: Not on file  Relationship status: Not on file  . Intimate partner violence:    Fear of current or ex partner: Not on file    Emotionally abused: Not on file    Physically abused: Not on file    Forced sexual activity: Not on file  Other Topics Concern  . Not on file  Social History Narrative  . Not on file    Review of Systems: See HPI, otherwise negative ROS   Physical Exam: BP (!) 167/69   Pulse 71   Temp 99.1 F (37.3 C) (Oral)   Resp 19   Ht 5\' 2"  (1.575 m)   Wt 149 lb (67.6 kg)   SpO2 100%   BMI 27.25 kg/m  General:   Alert,  pleasant and cooperative in NAD Head:  Normocephalic and atraumatic. Neck:  Supple; Lungs:  Clear throughout to auscultation.    Heart:  Regular rate and rhythm. Abdomen:  Soft, nontender and nondistended. Normal bowel sounds, without guarding, and without rebound.   Neurologic:  Alert and  oriented x4;  grossly normal neurologically.  Impression/Plan:     DYSPHAGIA  PLAN:  EGD/DIL TODAY DISCUSSED PROCEDURE, BENEFITS, & RISKS: < 1% chance of medication reaction, bleeding, OR perforation.

## 2017-04-15 ENCOUNTER — Other Ambulatory Visit: Payer: Self-pay

## 2017-04-15 ENCOUNTER — Telehealth: Payer: Self-pay | Admitting: Gastroenterology

## 2017-04-15 ENCOUNTER — Ambulatory Visit (HOSPITAL_COMMUNITY)
Admission: RE | Admit: 2017-04-15 | Discharge: 2017-04-15 | Disposition: A | Discharge status: Home / Self Care | Payer: Medicare Other | Source: Ambulatory Visit | Attending: Gastroenterology | Admitting: Gastroenterology

## 2017-04-15 DIAGNOSIS — R1310 Dysphagia, unspecified: Secondary | ICD-10-CM | POA: Diagnosis not present

## 2017-04-15 MED ORDER — IOPAMIDOL (ISOVUE-300) INJECTION 61%
INTRAVENOUS | Status: AC
  Administered 2017-04-15: 100 mL via ORAL
  Filled 2017-04-15: qty 300

## 2017-04-15 MED ORDER — LIDOCAINE VISCOUS 2 % MT SOLN: OROMUCOSAL | 1 refills | Status: DC

## 2017-04-15 NOTE — Telephone Encounter (Signed)
PLEASE CALL PT. THE SWALLOWING STUDY SHOWS HARDENING F HER LEFT CAROTID ARTERY. SHE SHOULD DISCUSS WITH HER PCP. SHE HAS CALCIFICATION AND BONE SPURS IN HER NEXT THAT CAN CONTRIBUTE TO HER DIFFICULTY SWALLOWING. AS WELL SHE HAS A WEAK ESOPHAGUS MUSCLE.  SHE MAY USE VISCOUS LIDOCAINE 2 TSP Q4-6H PRN FOR CHEST, OR UPPER ABDOMINAL PAIN OR HEARTBURN. USE NO MORE THAN 8 DOSES A DAY. IT WILL MAKE HER MOUTH, ESOPHAGUS, AND STOMACH NUMB.

## 2017-04-15 NOTE — Telephone Encounter (Signed)
PT is aware.

## 2017-04-15 NOTE — Telephone Encounter (Signed)
PT is aware that Tretha Sciara will call and let her know when to go.

## 2017-04-15 NOTE — Telephone Encounter (Signed)
Pt said that she had an EGD by [facility] on Friday and she hasn't felt right since. Please call patient at [phone]

## 2017-04-15 NOTE — Telephone Encounter (Signed)
PLEASE CALL PT. She needs a barium swallow today. Radiology aware.

## 2017-04-15 NOTE — Telephone Encounter (Signed)
Pt had breakfast at 9:30am. Called central scheduling, pt can go now to [facility]. Called and informed pt. Advised her to be NPO.

## 2017-04-15 NOTE — Telephone Encounter (Signed)
Pt said she has been having pain in her throat and esophagus since Sat when she coughs. Said she had EGD on Friday, but she slept all afternoon and was not aware of any problem then.  She has a dry hacking cough and that is the only time that she feels the pain. Please advise!

## 2017-04-16 NOTE — Telephone Encounter (Signed)
cc'ed to pcp °

## 2017-04-17 ENCOUNTER — Encounter (HOSPITAL_COMMUNITY): Payer: Self-pay | Admitting: Gastroenterology

## 2017-05-29 ENCOUNTER — Encounter: Payer: Self-pay | Admitting: Gastroenterology

## 2017-05-29 ENCOUNTER — Other Ambulatory Visit: Payer: Self-pay | Admitting: *Deleted

## 2017-05-29 ENCOUNTER — Ambulatory Visit (INDEPENDENT_AMBULATORY_CARE_PROVIDER_SITE_OTHER): Payer: Medicare Other | Admitting: Gastroenterology

## 2017-05-29 DIAGNOSIS — K219 Gastro-esophageal reflux disease without esophagitis: Secondary | ICD-10-CM

## 2017-05-29 DIAGNOSIS — R1013 Epigastric pain: Secondary | ICD-10-CM

## 2017-05-29 DIAGNOSIS — R1319 Other dysphagia: Secondary | ICD-10-CM

## 2017-05-29 DIAGNOSIS — R499 Unspecified voice and resonance disorder: Secondary | ICD-10-CM

## 2017-05-29 DIAGNOSIS — R1310 Dysphagia, unspecified: Secondary | ICD-10-CM | POA: Diagnosis not present

## 2017-05-29 DIAGNOSIS — K5901 Slow transit constipation: Secondary | ICD-10-CM

## 2017-05-29 NOTE — Progress Notes (Signed)
ON RECALL  °

## 2017-05-29 NOTE — Progress Notes (Addendum)
SPOKE TO PT'S Kelly Hebert [place], [phone].EXPLAINED UPPER ABDOMINAL PAIN DUE TO GERD/GASTRITIS. SYMPTOMS WORSE IN 2017 AND CT SCAN SHOWED NAIAP. SYMPTOMS EXACERBATED BY DEPRESSION/ANXIETY.CARES FOR 4 KIDS FREQUENTLY UNDER THE AGE OF 13 & IT SEEMS TOO MUCH FOR HER. DAUGHTER AWARE PT ADMITS THAT ANXIETY AND DEPRESSION ARE NOT CONTROLLED AND THE PT HAS DECLINED ADJUSTMENTS IN HER MEDS IN THE PAST. EXPLAINED SHE WAS GIVEN HANDOUTS TO IDENTIFY TRIGGERS FOR GASTRITIS/GERD. FOLLOW UP IN 4 MOS.

## 2017-05-29 NOTE — Assessment & Plan Note (Signed)
SYMPTOMS NOT CONTROLLED.   SEE ENT FOR EVALUATION. FOLLOW UP IN 4 MOS.

## 2017-05-29 NOTE — Assessment & Plan Note (Signed)
SYMPTOMS CONTROLLED/RESOLVED.  CONTINUE TO MONITOR SYMPTOMS. 

## 2017-05-29 NOTE — Progress Notes (Signed)
CC'D TO PCP °

## 2017-05-29 NOTE — Progress Notes (Addendum)
Subjective:    Patient ID: Kelly Hebert, female    DOB: 04-27-1940, 77 y.o.   MRN: [id number]  [name], NP  HPI 2016: 137 LBS AND TODAY 147 LBS. Eats and has pain in epigastrium and moves to under left ribs. APPETITE NOT GOOD. ONLY HAD CHICKEN SOUP/LEMONADE. ATE 2 ORANGES. WENT TO BED @830  AM. HAD SYMPTOMS YESTERDAY. HAPPEN JUST ABOUT EVERY DAY. SYMPTOMS LAST MINS TO 30 MINS. PAIN FEELS SHARP/CRAMPY. BETTER WITH MOVING AROUND AND BURPING. IF TAKES HALF ALKA SELZTER IT'S BETTER. TOOK VISCOUS LIDOCAINE BUT DIDN'T SEEM TO HELP. DRINKS HOT TEA(DECAF). MAY FEEL NAUSEATED JUST WHEN SHE HAS PAIN. ALL LAST WEEK HAD THE VIRUS: DIARRHEA EVERY HOUR FOR 5 DAYS. SWALLOWING BETTER. BMs: DOING GOOD. TRYING TO DRINK MORE  WATER OR GATORADE. DOESN'T EAT OUT. FEELS LIKE SHE IS HOARSE. THE LONGER SHE TALKS THE WORSE IT GETS. ANXIETY AND DEPRESSION ARE ONLY CONTROLLED FOR ONE HOUR A DAY. CARING FOR 4 KIDS: AGE 77, TWINS AGE 39, AGE 55.  PT DENIES FEVER, CHILLS, HEMATOCHEZIA, HEMATURIA, DYSURIA,  HEMATEMESIS, vomiting, melena, diarrhea, CHEST PAIN, SHORTNESS OF BREATH,  CHANGE IN BOWEL IN HABITS, constipation, problems swallowing, problems with sedation, OR heartburn or indigestion.  Past Medical History:  Diagnosis Date  . Acid reflux   . Anxiety   . Cancer [facility])    endometrial cancer  . Constipation   . COPD (chronic obstructive pulmonary disease) ([place])   . Depression   . Gout   . Heart murmur   . Hx of pyloric stenosis 11/14/2011  . Hypercholesteremia   . Hyperglycemia   . Hypertension   . Hypothyroidism   . Renal disorder    pelvic kidney  . S/P colonoscopy 2003   Dr. Nicolasa Ducking: normal colon, normal path.   . Vitamin D deficiency disease    Past Surgical History:  Procedure Laterality Date  . ABDOMINAL HYSTERECTOMY     complete  . BREAST SURGERY     breast biopsy   . COLONOSCOPY  03/09/2011   NFA:OZHYQMV diverticula, small internal hemorrhoids.   . ESOPHAGOGASTRODUODENOSCOPY    03/09/2011   HQI:ONGEXBMWU in the distal esophagus/ Stenosis at the pylorus/ Mild gastritis/ Hiatal hernia. gastric bx showed gastritis but no H.pylori  . ESOPHAGOGASTRODUODENOSCOPY  03/20/11   SLF: stricture distal esophgus and stenosis at pyloris likely from NSAIDs. s/p dialtion.   . ESOPHAGOGASTRODUODENOSCOPY N/A 01/24/2017   Procedure: ESOPHAGOGASTRODUODENOSCOPY (EGD);  Surgeon: Danie Binder, MD;  [facility];  Service: Endoscopy;  Laterality: N/A;  10:45am  . ESOPHAGOGASTRODUODENOSCOPY N/A 02/08/2017   Procedure: ESOPHAGOGASTRODUODENOSCOPY (EGD);  Surgeon: Danie Binder, MD;  [facility];  Service: Endoscopy;  Laterality: N/A;  12:45pm  . ESOPHAGOGASTRODUODENOSCOPY N/A 04/12/2017   Procedure: ESOPHAGOGASTRODUODENOSCOPY (EGD);  Surgeon: Danie Binder, MD;  [facility];  Service: Endoscopy;  Laterality: N/A;  1:30pm  . ESOPHAGOGASTRODUODENOSCOPY (EGD) WITH ESOPHAGEAL DILATION  11/23/2011   SLF: Stricture was found at the gastroesophageal junction/ Small hiatal hernia/  Sessile polyp was found in the gastric body and gastric fundus/ Stenosis was found at the pylorus/ The duodenal mucosa showed no abnormalities in the bulb and second portion of the duodenum  . ESOPHAGOGASTRODUODENOSCOPY (EGD) WITH ESOPHAGEAL DILATION  12/03/2011   XLK:GMWNU was found in the entire examined stomach/Stricture was found at the gastroesophageal junction  . LAPAROSCOPY  2000   Nicholson-stomach pain-pt reports negative  . ROTATOR CUFF REPAIR    . SAVORY DILATION N/A 01/24/2017   Procedure: SAVORY  DILATION;  Surgeon: Danie Binder, MD;  [facility];  Service: Endoscopy;  Laterality: N/A;  . SAVORY DILATION N/A 02/08/2017   Procedure: SAVORY DILATION;  Surgeon: Danie Binder, MD;  [facility];  Service: Endoscopy;  Laterality: N/A;  . SAVORY DILATION N/A 04/12/2017   Procedure: SAVORY DILATION;  Surgeon: Danie Binder, MD;  [facility];   Service: Endoscopy;  Laterality: N/A;    Allergies  Allergen Reactions  . Ivp Dye [Iodinated Diagnostic Agents] Hives, Itching and Other (See Comments)    Pt. States this happened 4 or 5 years ago in [place].    . Nifedipine Other (See Comments)    unknown    Current Outpatient Medications  Medication Sig    . albuterol (PROVENTIL HFA;VENTOLIN HFA) 108 (90 BASE) MCG/ACT inhaler Inhale 2 puffs into the lungs every 6 (six) hours as needed for wheezing or shortness of breath.     Marland Kitchen amitriptyline (ELAVIL) 10 MG tablet Take 10 mg by mouth at bedtime.     Marland Kitchen amLODipine (NORVASC) 10 MG tablet Take 10 mg by mouth daily.    . ARTIFICIAL TEAR OP Place 1 drop into both eyes daily.    Marland Kitchen atorvastatin (LIPITOR) 40 MG tablet Take 40 mg by mouth every evening.     . benazepril-hydrochlorthiazide (LOTENSIN HCT) 20-25 MG tablet Take 1 tablet by mouth daily.     Marland Kitchen buPROPion (WELLBUTRIN XL) 150 MG 24 hr tablet Take 150 mg by mouth daily.     . Chlorpheniramine Maleate (ALLERGY PO) Take 1 tablet by mouth daily as needed (allergies).    . cholecalciferol (VITAMIN D) 1000 units tablet Take 1,000 Units by mouth daily.    . citalopram (CELEXA) 40 MG tablet Take 40 mg by mouth daily.      Marland Kitchen DEXILANT 60 MG capsule TAKE 1 CAPSULE BY MOUTH EVERY DAY (Patient taking differently: TAKE 1 CAPSULE BY MOUTH EVERY DAY IN THE EVENING)    . fluticasone (FLOVENT HFA) 110 MCG/ACT inhaler Inhale 2 puffs into the lungs 2 (two) times daily as needed (for shortness of breath).     Marland Kitchen levothyroxine (SYNTHROID, LEVOTHROID) 50 MCG tablet Take 50 mcg by mouth daily.     Marland Kitchen      Rolan Lipa 145 MCG CAPS capsule TAKE 145 MCG BY MOUTH DAILY AS NEEDED IBS)    . LORazepam (ATIVAN) 1 MG tablet Take 1 mg by mouth 2 (two) times daily as needed for anxiety.     . Multiple Vitamins-Calcium (ONE-A-DAY WOMENS PO) Take 1 tablet by mouth daily.    Marland Kitchen aspirin EC 81 MG tablet Take 81 mg by mouth daily.     Review of Systems PER HPI OTHERWISE ALL  SYSTEMS ARE NEGATIVE.    Objective:   Physical Exam  Constitutional: She is oriented to person, place, and time. She appears well-developed and well-nourished. No distress.  HENT:  Head: Normocephalic and atraumatic.  Mouth/Throat: Oropharynx is clear and moist. No oropharyngeal exudate.  Eyes: Pupils are equal, round, and reactive to light. No scleral icterus.  Neck: Normal range of motion. Neck supple.  Cardiovascular: Normal rate, regular rhythm and normal heart sounds.  Pulmonary/Chest: Effort normal and breath sounds normal. No respiratory distress.  Abdominal: Soft. Bowel sounds are normal. She exhibits no distension. There is tenderness (MILD BLQs). There is no rebound and no guarding.  Musculoskeletal: She exhibits no edema.  Lymphadenopathy:    She has no cervical adenopathy.  Neurological:  She is alert and oriented to person, place, and time.  NO FOCAL DEFICITS  Psychiatric:  FLAT AFFECT, SLIGHTLY DEPRESSED MOOD   Vitals reviewed.     Assessment & Plan:

## 2017-05-29 NOTE — Patient Instructions (Addendum)
TO REDUCE ABDOMINAL PAIN:    1. AVOID REFLUX TRIGGERS. SEE INFO BELOW.    2. AVOID ITEMS THAT CAUSE GASTIRTIS. SEE INFO BELOW. USE ALKA SELZTER WHEN NEEDED TO RELIEVE UPPER ABDOMINAL PAIN.    3. FOLLOW LOW FAT DIET.    DRINK WATER TO KEEP YOUR URINE LIGHT YELLOW.  SEE EAR NOSE AND THROAT DOCTOR REGARDING YOUR HOARSENESS.   FOLLOW UP IN 4 MOS.    Lifestyle and home remedies TO CONTROL HEARTBURN You may eliminate or reduce the frequency of heartburn by making the following lifestyle changes:  . Control your weight. Being overweight is a major risk factor for heartburn and GERD. Excess pounds put pressure on your abdomen, pushing up your stomach and causing acid to back up into your esophagus.   . Eat smaller meals. 4 TO 6 MEALS A DAY. This reduces pressure on the lower esophageal sphincter, helping to prevent the valve from opening and acid from washing back into your esophagus.   Dolphus Jenny your belt. Clothes that fit tightly around your waist put pressure on your abdomen and the lower esophageal sphincter.   . Eliminate heartburn triggers. Everyone has specific triggers. Common triggers such as fatty or fried foods, spicy food, tomato sauce, carbonated beverages, alcohol, chocolate, mint, garlic, onion, caffeine and nicotine may make heartburn worse.   Marland Kitchen Avoid stooping or bending. Tying your shoes is OK. Bending over for longer periods to weed your garden isn't, especially soon after eating.   . Don't lie down after a meal. Wait at least three to four hours after eating before going to bed, and don't lie down right after eating.   Alternative medicine . Several home remedies exist for treating GERD, but they provide only temporary relief. They include drinking baking soda (sodium bicarbonate) added to water or drinking other fluids such as baking soda mixed with cream of tartar and water. . Although these liquids create temporary relief by neutralizing, washing away or buffering acids,  eventually they aggravate the situation by adding gas and fluid to your stomach, increasing pressure and causing more acid reflux. Further, adding more sodium to your diet may increase your blood pressure and add stress to your heart, and excessive bicarbonate ingestion can alter the acid-base balance in your body. Gastritis  Gastritis is an inflammation (the body's way of reacting to injury and/or infection) of the stomach. It is often caused by bacterial (germ) infections. It can also be caused BY ASPIRIN, BC/GOODY POWDER'S, (IBUPROFEN) MOTRIN, OR ALEVE (NAPROXEN), chemicals (including alcohol), SPICY FOODS, and medications. This illness may be associated with generalized malaise (feeling tired, not well), UPPER ABDOMINAL STOMACH cramps, and fever.

## 2017-05-29 NOTE — Assessment & Plan Note (Signed)
SYMPTOMS CONTROLLED/RESOLVED. EGD/DIL MAR 2019.  CONTINUE TO MONITOR SYMPTOMS.

## 2017-05-29 NOTE — Assessment & Plan Note (Signed)
SYMPTOMS NOT IDEALLY CONTROLLED DUE TO GERD/GASTRITIS.  TO REDUCE ABDOMINAL PAIN:    1. AVOID REFLUX TRIGGERS. SEE INFO BELOW.    2. AVOID ITEMS THAT CAUSE GASTIRTIS. SEE INFO BELOW. USE ALKA SELZTER WHEN NEEDED TO RELIEVE UPPER ABDOMINAL PAIN.    3. FOLLOW LOW FAT DIET.  FOLLOW UP IN 4 MOS.

## 2017-05-29 NOTE — Assessment & Plan Note (Signed)
SYMPTOMS CONTROLLED/RESOLVED.  CONTINUE LINZESS. DRINK WATER EAT FIBER FOLLOW UP IN 4 MOS.

## 2017-08-11 ENCOUNTER — Other Ambulatory Visit: Payer: Self-pay | Admitting: Gastroenterology

## 2017-08-25 ENCOUNTER — Emergency Department (HOSPITAL_COMMUNITY): Payer: Medicare Other

## 2017-08-25 ENCOUNTER — Other Ambulatory Visit: Payer: Self-pay

## 2017-08-25 ENCOUNTER — Emergency Department (HOSPITAL_COMMUNITY)
Admission: EM | Admit: 2017-08-25 | Discharge: 2017-08-25 | Disposition: A | Discharge status: Home / Self Care | Payer: Medicare Other | Attending: Emergency Medicine | Admitting: Emergency Medicine

## 2017-08-25 ENCOUNTER — Encounter (HOSPITAL_COMMUNITY): Payer: Self-pay | Admitting: Emergency Medicine

## 2017-08-25 DIAGNOSIS — Z7982 Long term (current) use of aspirin: Secondary | ICD-10-CM | POA: Insufficient documentation

## 2017-08-25 DIAGNOSIS — E039 Hypothyroidism, unspecified: Secondary | ICD-10-CM | POA: Diagnosis not present

## 2017-08-25 DIAGNOSIS — J449 Chronic obstructive pulmonary disease, unspecified: Secondary | ICD-10-CM | POA: Insufficient documentation

## 2017-08-25 DIAGNOSIS — Y9222 Religious institution as the place of occurrence of the external cause: Secondary | ICD-10-CM | POA: Diagnosis not present

## 2017-08-25 DIAGNOSIS — Y33XXXA Other specified events, undetermined intent, initial encounter: Secondary | ICD-10-CM | POA: Insufficient documentation

## 2017-08-25 DIAGNOSIS — Z79899 Other long term (current) drug therapy: Secondary | ICD-10-CM | POA: Diagnosis not present

## 2017-08-25 DIAGNOSIS — Y998 Other external cause status: Secondary | ICD-10-CM | POA: Insufficient documentation

## 2017-08-25 DIAGNOSIS — Y939 Activity, unspecified: Secondary | ICD-10-CM | POA: Diagnosis not present

## 2017-08-25 DIAGNOSIS — I10 Essential (primary) hypertension: Secondary | ICD-10-CM | POA: Insufficient documentation

## 2017-08-25 DIAGNOSIS — S299XXA Unspecified injury of thorax, initial encounter: Secondary | ICD-10-CM | POA: Diagnosis present

## 2017-08-25 DIAGNOSIS — E7800 Pure hypercholesterolemia, unspecified: Secondary | ICD-10-CM | POA: Diagnosis not present

## 2017-08-25 DIAGNOSIS — S29012A Strain of muscle and tendon of back wall of thorax, initial encounter: Secondary | ICD-10-CM | POA: Diagnosis not present

## 2017-08-25 DIAGNOSIS — T148XXA Other injury of unspecified body region, initial encounter: Secondary | ICD-10-CM

## 2017-08-25 LAB — URINALYSIS, ROUTINE W REFLEX MICROSCOPIC
BACTERIA UA: NONE SEEN
Bilirubin Urine: NEGATIVE
Glucose, UA: NEGATIVE mg/dL
Hgb urine dipstick: NEGATIVE
Ketones, ur: NEGATIVE mg/dL
Nitrite: NEGATIVE
PROTEIN: NEGATIVE mg/dL
Specific Gravity, Urine: 1.004 — ABNORMAL LOW (ref 1.005–1.030)
pH: 7 (ref 5.0–8.0)

## 2017-08-25 LAB — CBC WITH DIFFERENTIAL/PLATELET
BASOS PCT: 1 %
Basophils Absolute: 0.1 10*3/uL (ref 0.0–0.1)
EOS PCT: 5 %
Eosinophils Absolute: 0.5 10*3/uL (ref 0.0–0.7)
HCT: 35.8 % — ABNORMAL LOW (ref 36.0–46.0)
Hemoglobin: 11.6 g/dL — ABNORMAL LOW (ref 12.0–15.0)
LYMPHS ABS: 3.2 10*3/uL (ref 0.7–4.0)
Lymphocytes Relative: 37 %
MCH: 30.2 pg (ref 26.0–34.0)
MCHC: 32.4 g/dL (ref 30.0–36.0)
MCV: 93.2 fL (ref 78.0–100.0)
MONOS PCT: 9 %
Monocytes Absolute: 0.8 10*3/uL (ref 0.1–1.0)
NEUTROS PCT: 48 %
Neutro Abs: 4.2 10*3/uL (ref 1.7–7.7)
PLATELETS: 225 10*3/uL (ref 150–400)
RBC: 3.84 MIL/uL — AB (ref 3.87–5.11)
RDW: 12.6 % (ref 11.5–15.5)
WBC: 8.7 10*3/uL (ref 4.0–10.5)

## 2017-08-25 LAB — COMPREHENSIVE METABOLIC PANEL
ALT: 23 U/L (ref 0–44)
AST: 22 U/L (ref 15–41)
Albumin: 4 g/dL (ref 3.5–5.0)
Alkaline Phosphatase: 79 U/L (ref 38–126)
Anion gap: 6 (ref 5–15)
BUN: 12 mg/dL (ref 8–23)
CHLORIDE: 104 mmol/L (ref 98–111)
CO2: 29 mmol/L (ref 22–32)
CREATININE: 1.02 mg/dL — AB (ref 0.44–1.00)
Calcium: 9.5 mg/dL (ref 8.9–10.3)
GFR calc Af Amer: 59 mL/min — ABNORMAL LOW (ref >60)
GFR calc non Af Amer: 51 mL/min — ABNORMAL LOW (ref >60)
GLUCOSE: 95 mg/dL (ref 70–99)
POTASSIUM: 3.7 mmol/L (ref 3.5–5.1)
Sodium: 139 mmol/L (ref 135–145)
Total Bilirubin: 0.7 mg/dL (ref 0.3–1.2)
Total Protein: 7.1 g/dL (ref 6.5–8.1)

## 2017-08-25 LAB — LIPASE, BLOOD: Lipase: 33 U/L (ref 11–51)

## 2017-08-25 MED ORDER — ACETAMINOPHEN 325 MG PO TABS
650.0000 mg | ORAL_TABLET | Freq: Once | ORAL | Status: AC
  Administered 2017-08-25: 650 mg via ORAL
  Filled 2017-08-25: qty 2

## 2017-08-25 MED ORDER — METHOCARBAMOL 500 MG PO TABS: 500.0000 mg | ORAL_TABLET | Freq: Three times a day (TID) | ORAL | 0 refills | Status: DC | PRN

## 2017-08-25 MED ORDER — METHOCARBAMOL 500 MG PO TABS
500.0000 mg | ORAL_TABLET | Freq: Once | ORAL | Status: AC
  Administered 2017-08-25: 500 mg via ORAL
  Filled 2017-08-25: qty 1

## 2017-08-25 NOTE — ED Provider Notes (Signed)
[facility] EMERGENCY DEPARTMENT Provider Note   CSN: [id number] Arrival date & time: 08/25/17  1654     History   Chief Complaint Chief Complaint  Patient presents with  . Back Pain    HPI Kelly Hebert is a 77 y.o. female.  HPI Patient presents with bilateral thoracic back pain that started today while she was at church.  States that it gradually began in both shoulders and then worked his way down.  She denies any shortness of breath.  Denied chest pain.  States she was having some mild epigastric discomfort but no nausea or vomiting.  Denies any focal weakness or numbness.  No urinary incontinence. Past Medical History:  Diagnosis Date  . Acid reflux   . Anxiety   . Cancer [facility])    endometrial cancer  . Constipation   . COPD (chronic obstructive pulmonary disease) ([place])   . Depression   . Gout   . Heart murmur   . Hx of pyloric stenosis 11/14/2011  . Hypercholesteremia   . Hyperglycemia   . Hypertension   . Hypothyroidism   . Renal disorder    pelvic kidney  . S/P colonoscopy 2003   Dr. Nicolasa Ducking: normal colon, normal path.   . Vitamin D deficiency disease     Patient Active Problem List   Diagnosis Date Noted  . Hoarseness or changing voice 05/29/2017  . Early satiety   . Dyspepsia 05/17/2016  . Constipation 04/17/2012  . Dysuria 11/14/2011  . Dysphagia 02/19/2011  . Asthma 01/07/2011  . Benign hypertension 01/07/2011  . Hyperlipidemia 01/07/2011  . Anxiety 01/07/2011  . Hypothyroidism 01/07/2011  . GERD (gastroesophageal reflux disease) 01/07/2011    Past Surgical History:  Procedure Laterality Date  . ABDOMINAL HYSTERECTOMY     complete  . BREAST SURGERY     breast biopsy   . COLONOSCOPY  03/09/2011   YJE:HUDJSHF diverticula, small internal hemorrhoids.   . ESOPHAGOGASTRODUODENOSCOPY   03/09/2011   WYO:VZCHYIFOY in the distal esophagus/ Stenosis at the pylorus/ Mild gastritis/ Hiatal hernia. gastric bx showed gastritis but no H.pylori  .  ESOPHAGOGASTRODUODENOSCOPY  03/20/11   SLF: stricture distal esophgus and stenosis at pyloris likely from NSAIDs. s/p dialtion.   . ESOPHAGOGASTRODUODENOSCOPY N/A 01/24/2017   Procedure: ESOPHAGOGASTRODUODENOSCOPY (EGD);  Surgeon: Danie Binder, MD;  Location: [facility];  Service: Endoscopy;  Laterality: N/A;  10:45am  . ESOPHAGOGASTRODUODENOSCOPY N/A 02/08/2017   Procedure: ESOPHAGOGASTRODUODENOSCOPY (EGD);  Surgeon: Danie Binder, MD;  Location: [facility];  Service: Endoscopy;  Laterality: N/A;  12:45pm  . ESOPHAGOGASTRODUODENOSCOPY N/A 04/12/2017   Procedure: ESOPHAGOGASTRODUODENOSCOPY (EGD);  Surgeon: Danie Binder, MD;  Location: [facility];  Service: Endoscopy;  Laterality: N/A;  1:30pm  . ESOPHAGOGASTRODUODENOSCOPY (EGD) WITH ESOPHAGEAL DILATION  11/23/2011   SLF: Stricture was found at the gastroesophageal junction/ Small hiatal hernia/  Sessile polyp was found in the gastric body and gastric fundus/ Stenosis was found at the pylorus/ The duodenal mucosa showed no abnormalities in the bulb and second portion of the duodenum  . ESOPHAGOGASTRODUODENOSCOPY (EGD) WITH ESOPHAGEAL DILATION  12/03/2011   DXA:JOINO was found in the entire examined stomach/Stricture was found at the gastroesophageal junction  . LAPAROSCOPY  2000   [place]-stomach pain-pt reports negative  . ROTATOR CUFF REPAIR    . SAVORY DILATION N/A 01/24/2017   Procedure: SAVORY DILATION;  Surgeon: Danie Binder, MD;  Location: [facility];  Service: Endoscopy;  Laterality: N/A;  . SAVORY DILATION N/A 02/08/2017  Procedure: SAVORY DILATION;  Surgeon: Danie Binder, MD;  Location: [facility];  Service: Endoscopy;  Laterality: N/A;  . SAVORY DILATION N/A 04/12/2017   Procedure: SAVORY DILATION;  Surgeon: Danie Binder, MD;  Location: [facility];  Service: Endoscopy;  Laterality: N/A;     OB History    Gravida  3   Para  3   Term  3   Preterm      AB      Living  3     SAB      TAB        Ectopic      Multiple      Live Births               Home Medications    Prior to Admission medications   Medication Sig Start Date End Date Taking? Authorizing Provider  albuterol (PROVENTIL HFA;VENTOLIN HFA) 108 (90 BASE) MCG/ACT inhaler Inhale 2 puffs into the lungs every 6 (six) hours as needed for wheezing or shortness of breath.    Yes [provider]  amitriptyline (ELAVIL) 10 MG tablet Take 10 mg by mouth at bedtime.  07/29/12  Yes [provider]  amLODipine (NORVASC) 10 MG tablet Take 10 mg by mouth daily.   Yes [provider]  aspirin EC 81 MG tablet Take 81 mg by mouth daily.   Yes [provider]  atorvastatin (LIPITOR) 40 MG tablet Take 40 mg by mouth every evening.  12/31/16  Yes [provider]  benazepril-hydrochlorthiazide (LOTENSIN HCT) 20-25 MG tablet Take 1 tablet by mouth daily.  10/03/11  Yes [provider]  buPROPion (WELLBUTRIN XL) 150 MG 24 hr tablet Take 150 mg by mouth daily.    Yes [provider]  cholecalciferol (VITAMIN D) 1000 units tablet Take 2,000 Units by mouth daily.    Yes [provider]  citalopram (CELEXA) 40 MG tablet Take 40 mg by mouth daily.     Yes [provider]  DEXILANT 60 MG capsule TAKE 1 CAPSULE BY MOUTH EVERY DAY Patient taking differently: TAKE 1 CAPSULE BY MOUTH EVERY DAY IN THE EVENING 02/08/17  Yes Carlis Stable, NP  fluticasone (FLOVENT HFA) 110 MCG/ACT inhaler Inhale 2 puffs into the lungs 2 (two) times daily as needed (for shortness of breath).    Yes [provider]  hydroxypropyl methylcellulose / hypromellose (ISOPTO TEARS / GONIOVISC) 2.5 % ophthalmic solution Place 1 drop into both eyes 2 (two) times daily.   Yes [provider]  levothyroxine (SYNTHROID, LEVOTHROID) 50 MCG tablet Take 50 mcg by mouth daily before breakfast.  04/10/12  Yes [provider]  LINZESS 145 MCG CAPS capsule TAKE 1 CAPSULE BY MOUTH EVERY  DAY Patient taking differently: TAKE 1 CAPSULE BY MOUTH EVERY DAY AS NEEDED FOR CONSTIPATION 08/14/17  Yes Walden Field A, NP  LORazepam (ATIVAN) 1 MG tablet Take 1 mg by mouth 2 (two) times daily as needed for anxiety.    Yes [provider]  methocarbamol (ROBAXIN) 500 MG tablet Take 1 tablet (500 mg total) by mouth every 8 (eight) hours as needed for muscle spasms. 08/25/17   Julianne Rice, MD    Family History Family History  Problem Relation Age of Onset  . Kidney disease Sister   . Cirrhosis Brother        ?etoh  . Diabetes Brother   . Heart attack Brother   . Heart disease Mother   . Heart  attack Mother   . Heart disease Father   . Heart attack Father   . Diabetes Brother   . Cirrhosis Sister        Non-etoh  . Colon cancer Neg Hx     Social History Social History   Tobacco Use  . Smoking status: Never Smoker  . Smokeless tobacco: Never Used  Substance Use Topics  . Alcohol use: No  . Drug use: No     Allergies   Ivp dye [iodinated diagnostic agents] and Nifedipine   Review of Systems Review of Systems  Constitutional: Negative for chills and fever.  Respiratory: Negative for cough and shortness of breath.   Cardiovascular: Negative for chest pain.  Gastrointestinal: Positive for abdominal pain. Negative for constipation, diarrhea, nausea and vomiting.  Genitourinary: Negative for dysuria, flank pain, frequency and hematuria.  Musculoskeletal: Positive for back pain and myalgias. Negative for neck pain.  Skin: Negative for rash and wound.  Neurological: Negative for dizziness, weakness, light-headedness, numbness and headaches.  All other systems reviewed and are negative.    Physical Exam Updated Vital Signs BP (!) 142/70   Pulse 77   Temp 98 F (36.7 C) (Oral)   Resp 17   Ht 5\' 2"  (1.575 m)   Wt 67.1 kg (148 lb)   SpO2 98%   BMI 27.07 kg/m   Physical Exam  Constitutional: She is oriented to person, place, and time. She appears  well-developed and well-nourished. No distress.  HENT:  Head: Normocephalic and atraumatic.  Mouth/Throat: Oropharynx is clear and moist. No oropharyngeal exudate.  Eyes: Pupils are equal, round, and reactive to light. EOM are normal.  Neck: Normal range of motion. Neck supple. No JVD present.  No meningismus.  No posterior midline cervical tenderness to palpation.  Cardiovascular: Normal rate and regular rhythm. Exam reveals no gallop and no friction rub.  No murmur heard. Pulmonary/Chest: Effort normal and breath sounds normal. No stridor. No respiratory distress. She has no wheezes. She has no rales. She exhibits no tenderness.  Abdominal: Soft. Bowel sounds are normal. There is no tenderness. There is no rebound and no guarding.  Musculoskeletal: Normal range of motion. She exhibits tenderness. She exhibits no edema.  No midline thoracic tenderness to palpation.  Patient has diffuse thoracic muscular tenderness bilaterally.  There is no obvious deformity or swelling.  She does have some minimal midline inferior lumbar tenderness.  Negative straight leg raise bilaterally.  No CVA tenderness.  No lower extremity swelling, asymmetry or tenderness.  Distal pulses are 2+ in all extremities.  Lymphadenopathy:    She has no cervical adenopathy.  Neurological: She is alert and oriented to person, place, and time.  Skin: Skin is warm and dry. Capillary refill takes less than 2 seconds. No rash noted. She is not diaphoretic. No erythema.  Psychiatric: She has a normal mood and affect. Her behavior is normal.  Nursing note and vitals reviewed.    ED Treatments / Results  Labs (all labs ordered are listed, but only abnormal results are displayed) Labs Reviewed  CBC WITH DIFFERENTIAL/PLATELET - Abnormal; Notable for the following components:      Result Value   RBC 3.84 (*)    Hemoglobin 11.6 (*)    HCT 35.8 (*)    All other components within normal limits  COMPREHENSIVE METABOLIC PANEL -  Abnormal; Notable for the following components:   Creatinine, Ser 1.02 (*)    GFR calc non Af Amer 51 (*)    GFR  calc Af Amer 59 (*)    All other components within normal limits  URINALYSIS, ROUTINE W REFLEX MICROSCOPIC - Abnormal; Notable for the following components:   Color, Urine STRAW (*)    Specific Gravity, Urine 1.004 (*)    Leukocytes, UA TRACE (*)    All other components within normal limits  LIPASE, BLOOD    EKG None  Radiology Dg Chest 2 View  Result Date: 08/25/2017 CLINICAL DATA:  THORACIC BACK PAIN, PER ER NOTE, Patient complains of back pain that began at church this evening when she leaned forward and took a deep breath. HISTORY OF CANCER, HEART MURMUR, COPD, RENAL DISORDER EXAM: CHEST - 2 VIEW COMPARISON:  11/18/2013 FINDINGS: The heart size and mediastinal contours are within normal limits. Both lungs are clear. The visualized skeletal structures are unremarkable. IMPRESSION: No active cardiopulmonary disease. Electronically Signed   By: Nolon Nations M.D.   On: 08/25/2017 18:35    Procedures Procedures (including critical care time)  Medications Ordered in ED Medications  acetaminophen (TYLENOL) tablet 650 mg (650 mg Oral Given 08/25/17 2020)  methocarbamol (ROBAXIN) tablet 500 mg (500 mg Oral Given 08/25/17 2020)     Initial Impression / Assessment and Plan / ED Course  I have reviewed the triage vital signs and the nursing notes.  Pertinent labs & imaging results that were available during my care of the patient were reviewed by me and considered in my medical decision making (see chart for details).    Patient symptoms have improved.  Screening x-ray and labs within normal limits.  Suspect symptoms are related to muscle strain and spasm.  Patient's vital signs remained stable.  She is asked to be discharged home.  Will discharge home with symptomatic treatment and strict return precautions have been given.   Final Clinical Impressions(s) / ED Diagnoses    Final diagnoses:  Muscle strain    ED Discharge Orders        Ordered    methocarbamol (ROBAXIN) 500 MG tablet  Every 8 hours PRN     08/25/17 2012       Julianne Rice, MD 08/25/17 2046

## 2017-08-25 NOTE — ED Triage Notes (Signed)
Patient complains of back pain that began at church this evening when she leaned forward and took a deep breath.

## 2017-08-25 NOTE — ED Notes (Signed)
EDP at bedside  

## 2017-08-30 ENCOUNTER — Encounter: Payer: Self-pay | Admitting: Gastroenterology

## 2017-10-16 ENCOUNTER — Other Ambulatory Visit: Payer: Self-pay | Admitting: Nurse Practitioner

## 2017-10-26 ENCOUNTER — Other Ambulatory Visit: Payer: Self-pay

## 2017-10-26 ENCOUNTER — Encounter (HOSPITAL_COMMUNITY): Payer: Self-pay

## 2017-10-26 ENCOUNTER — Emergency Department (HOSPITAL_COMMUNITY)
Admission: EM | Admit: 2017-10-26 | Discharge: 2017-10-26 | Disposition: A | Discharge status: Home / Self Care | Payer: Medicare Other | Attending: Emergency Medicine | Admitting: Emergency Medicine

## 2017-10-26 DIAGNOSIS — Z7982 Long term (current) use of aspirin: Secondary | ICD-10-CM | POA: Insufficient documentation

## 2017-10-26 DIAGNOSIS — G8929 Other chronic pain: Secondary | ICD-10-CM | POA: Insufficient documentation

## 2017-10-26 DIAGNOSIS — M25511 Pain in right shoulder: Secondary | ICD-10-CM | POA: Insufficient documentation

## 2017-10-26 DIAGNOSIS — E039 Hypothyroidism, unspecified: Secondary | ICD-10-CM | POA: Insufficient documentation

## 2017-10-26 DIAGNOSIS — Z79899 Other long term (current) drug therapy: Secondary | ICD-10-CM | POA: Diagnosis not present

## 2017-10-26 DIAGNOSIS — I10 Essential (primary) hypertension: Secondary | ICD-10-CM | POA: Diagnosis not present

## 2017-10-26 DIAGNOSIS — J449 Chronic obstructive pulmonary disease, unspecified: Secondary | ICD-10-CM | POA: Insufficient documentation

## 2017-10-26 MED ORDER — HYDROCODONE-ACETAMINOPHEN 5-325 MG PO TABS: 1.0000 | ORAL_TABLET | Freq: Four times a day (QID) | ORAL | 0 refills | Status: DC | PRN

## 2017-10-26 NOTE — ED Provider Notes (Signed)
[facility] EMERGENCY DEPARTMENT Provider Note   CSN: [id number] Arrival date & time: 10/26/17  0745     History   Chief Complaint Chief Complaint  Patient presents with  . Shoulder Pain    HPI Kelly Hebert is a 77 y.o. female.  She has had posterior right shoulder pain on and off for over 6 months.  She does not recall any trauma.  She is seen her doctor for it and had x-rays.  Last week she was referred into a spine doctor and they gave her an injection.  She says it helped for a couple of days but the pain is recurred.  It is not associate with any numbness or weakness.  It is worsened with specific movements of her arm.  She says Tylenol and ibuprofen upset her stomach so she has not taken them.  She has taken Vicodin before and she says that helps with her pain but she does not have any pain medicine now.  Her doctor was unavailable yesterday when she called and so she is here looking for some pain medicine.  She denies other complaints  The history is provided by the patient.  Shoulder Pain   This is a chronic problem. Episode onset: 6 months. The problem occurs daily. The problem has not changed since onset.The pain is present in the right shoulder. The quality of the pain is described as sharp. The pain is moderate. Pertinent negatives include no numbness, full range of motion, no stiffness, no tingling and no itching. The symptoms are aggravated by activity. She has tried cold for the symptoms. The treatment provided mild relief. There has been no history of extremity trauma. Family history is significant for no gout.    Past Medical History:  Diagnosis Date  . Acid reflux   . Anxiety   . Cancer [facility])    endometrial cancer  . Constipation   . COPD (chronic obstructive pulmonary disease) ([place])   . Depression   . Gout   . Heart murmur   . Hx of pyloric stenosis 11/14/2011  . Hypercholesteremia   . Hyperglycemia   . Hypertension   . Hypothyroidism   . Renal disorder     pelvic kidney  . S/P colonoscopy 2003   Dr. Nicolasa Ducking: normal colon, normal path.   . Vitamin D deficiency disease     Patient Active Problem List   Diagnosis Date Noted  . Hoarseness or changing voice 05/29/2017  . Early satiety   . Dyspepsia 05/17/2016  . Constipation 04/17/2012  . Dysuria 11/14/2011  . Dysphagia 02/19/2011  . Asthma 01/07/2011  . Benign hypertension 01/07/2011  . Hyperlipidemia 01/07/2011  . Anxiety 01/07/2011  . Hypothyroidism 01/07/2011  . GERD (gastroesophageal reflux disease) 01/07/2011    Past Surgical History:  Procedure Laterality Date  . ABDOMINAL HYSTERECTOMY     complete  . BREAST SURGERY     breast biopsy   . COLONOSCOPY  03/09/2011   VHQ:IONGEXB diverticula, small internal hemorrhoids.   . ESOPHAGOGASTRODUODENOSCOPY   03/09/2011   MWU:XLKGMWNUU in the distal esophagus/ Stenosis at the pylorus/ Mild gastritis/ Hiatal hernia. gastric bx showed gastritis but no H.pylori  . ESOPHAGOGASTRODUODENOSCOPY  03/20/11   SLF: stricture distal esophgus and stenosis at pyloris likely from NSAIDs. s/p dialtion.   . ESOPHAGOGASTRODUODENOSCOPY N/A 01/24/2017   Procedure: ESOPHAGOGASTRODUODENOSCOPY (EGD);  Surgeon: Danie Binder, MD;  [facility];  Service: Endoscopy;  Laterality: N/A;  10:45am  . ESOPHAGOGASTRODUODENOSCOPY N/A 02/08/2017   Procedure:  ESOPHAGOGASTRODUODENOSCOPY (EGD);  Surgeon: Danie Binder, MD;  Location[facility];  Service: Endoscopy;  Laterality: N/A;  12:45pm  . ESOPHAGOGASTRODUODENOSCOPY N/A 04/12/2017   Procedure: ESOPHAGOGASTRODUODENOSCOPY (EGD);  Surgeon: Danie Binder, MD;  Location: [facility];  Service: Endoscopy;  Laterality: N/A;  1:30pm  . ESOPHAGOGASTRODUODENOSCOPY (EGD) WITH ESOPHAGEAL DILATION  11/23/2011   SLF: Stricture was found at the gastroesophageal junction/ Small hiatal hernia/  Sessile polyp was found in the gastric body and gastric fundus/ Stenosis was found at the pylorus/ The duodenal mucosa showed  no abnormalities in the bulb and second portion of the duodenum  . ESOPHAGOGASTRODUODENOSCOPY (EGD) WITH ESOPHAGEAL DILATION  12/03/2011   QIH:KVQQV was found in the entire examined stomach/Stricture was found at the gastroesophageal junction  . LAPAROSCOPY  2000   [place]-stomach pain-pt reports negative  . ROTATOR CUFF REPAIR    . SAVORY DILATION N/A 01/24/2017   Procedure: SAVORY DILATION;  Surgeon: Danie Binder, MD;  [facility];  Service: Endoscopy;  Laterality: N/A;  . SAVORY DILATION N/A 02/08/2017   Procedure: SAVORY DILATION;  Surgeon: Danie Binder, MD;  [facility];  Service: Endoscopy;  Laterality: N/A;  . SAVORY DILATION N/A 04/12/2017   Procedure: SAVORY DILATION;  Surgeon: Danie Binder, MD;  [facility];  Service: Endoscopy;  Laterality: N/A;     OB History    Gravida  3   Para  3   Term  3   Preterm      AB      Living  3     SAB      TAB      Ectopic      Multiple      Live Births               Home Medications    Prior to Admission medications   Medication Sig Start Date End Date Taking? Authorizing Provider  albuterol (PROVENTIL HFA;VENTOLIN HFA) 108 (90 BASE) MCG/ACT inhaler Inhale 2 puffs into the lungs every 6 (six) hours as needed for wheezing or shortness of breath.     [provider]  amitriptyline (ELAVIL) 10 MG tablet Take 10 mg by mouth at bedtime.  07/29/12   [provider]  amLODipine (NORVASC) 10 MG tablet Take 10 mg by mouth daily.    [provider]  aspirin EC 81 MG tablet Take 81 mg by mouth daily.    [provider]  atorvastatin (LIPITOR) 40 MG tablet Take 40 mg by mouth every evening.  12/31/16   [provider]  benazepril-hydrochlorthiazide (LOTENSIN HCT) 20-25 MG tablet Take 1 tablet by mouth daily.  10/03/11   [provider]  buPROPion (WELLBUTRIN XL) 150 MG 24 hr tablet Take 150 mg by mouth daily.     [provider]   cholecalciferol (VITAMIN D) 1000 units tablet Take 2,000 Units by mouth daily.     [provider]  citalopram (CELEXA) 40 MG tablet Take 40 mg by mouth daily.      [provider]  DEXILANT 60 MG capsule TAKE 1 CAPSULE BY MOUTH EVERY DAY 10/23/17   Carlis Stable, NP  fluticasone (FLOVENT HFA) 110 MCG/ACT inhaler Inhale 2 puffs into the lungs 2 (two) times daily as needed (for shortness of breath).     [provider]  hydroxypropyl methylcellulose / hypromellose (ISOPTO TEARS / GONIOVISC) 2.5 % ophthalmic solution Place 1 drop into both eyes 2 (two)  times daily.    [provider]  levothyroxine (SYNTHROID, LEVOTHROID) 50 MCG tablet Take 50 mcg by mouth daily before breakfast.  04/10/12   [provider]  LINZESS 145 MCG CAPS capsule TAKE 1 CAPSULE BY MOUTH EVERY DAY Patient taking differently: TAKE 1 CAPSULE BY MOUTH EVERY DAY AS NEEDED FOR CONSTIPATION 08/14/17   Carlis Stable, NP  LORazepam (ATIVAN) 1 MG tablet Take 1 mg by mouth 2 (two) times daily as needed for anxiety.     [provider]  methocarbamol (ROBAXIN) 500 MG tablet Take 1 tablet (500 mg total) by mouth every 8 (eight) hours as needed for muscle spasms. 08/25/17   Julianne Rice, MD    Family History Family History  Problem Relation Age of Onset  . Kidney disease Sister   . Cirrhosis Brother        ?etoh  . Diabetes Brother   . Heart attack Brother   . Heart disease Mother   . Heart attack Mother   . Heart disease Father   . Heart attack Father   . Diabetes Brother   . Cirrhosis Sister        Non-etoh  . Colon cancer Neg Hx     Social History Social History   Tobacco Use  . Smoking status: Never Smoker  . Smokeless tobacco: Never Used  Substance Use Topics  . Alcohol use: No  . Drug use: No     Allergies   Ivp dye [iodinated diagnostic agents] and Nifedipine   Review of Systems Review of Systems  Constitutional: Negative for fever.  HENT: Negative  for sore throat.   Eyes: Negative for visual disturbance.  Respiratory: Negative for shortness of breath.   Cardiovascular: Negative for chest pain.  Gastrointestinal: Negative for abdominal pain.  Genitourinary: Negative for dysuria.  Musculoskeletal: Negative for back pain and stiffness.  Skin: Negative for itching and rash.  Neurological: Negative for tingling and numbness.     Physical Exam Updated Vital Signs BP (!) 173/98 (BP Location: Left Arm)   Pulse 71   Temp 98 F (36.7 C) (Oral)   Resp 18   Ht 5\' 2"  (1.575 m)   Wt 67.1 kg   SpO2 100%   BMI 27.07 kg/m   Physical Exam  Constitutional: She appears well-developed and well-nourished.  HENT:  Head: Normocephalic and atraumatic.  Eyes: Conjunctivae are normal.  Neck: Normal range of motion. Neck supple.  Cardiovascular: Normal rate, regular rhythm and normal heart sounds.  Pulmonary/Chest: Effort normal. She has no wheezes. She has no rales.  Musculoskeletal: Normal range of motion. She exhibits no deformity.  She some tenderness through the posterior shoulder and through the rhomboid and trapezius area.  Normal shoulder landmarks.  Full range of motion and no signs of impingement.  Neurological: She is alert. She has normal strength. No sensory deficit. GCS eye subscore is 4. GCS verbal subscore is 5. GCS motor subscore is 6.  Skin: Skin is warm and dry.  Psychiatric: She has a normal mood and affect.  Nursing note and vitals reviewed.    ED Treatments / Results  Labs (all labs ordered are listed, but only abnormal results are displayed) Labs Reviewed - No data to display  EKG None  Radiology No results found.  Procedures Procedures (including critical care time)  Medications Ordered in ED Medications - No data to display   Initial Impression / Assessment and Plan / ED Course  I have reviewed the triage vital signs  and the nursing notes.  Pertinent labs & imaging results that were available during  my care of the patient were reviewed by me and considered in my medical decision making (see chart for details).   77 year old female here with atraumatic right shoulder pain that is been going on for months.  She recently had a spinal injection that did not relieve her symptoms.  She is here asking for some medication to help with the pain until she can follow-up with her doctor.  She does not have any concerning findings in her history or exam that she needs further work-up here today.  I am prescribing a short course of some pain medicine that she is used in the past with success.  Final Clinical Impressions(s) / ED Diagnoses   Final diagnoses:  Chronic right shoulder pain    ED Discharge Orders         Ordered    HYDROcodone-acetaminophen (NORCO/VICODIN) 5-325 MG tablet  Every 6 hours PRN     10/26/17 0814           Hayden Rasmussen, MD 10/26/17 [phone]

## 2017-10-26 NOTE — ED Triage Notes (Signed)
Pt had rotator cuff surgery years ago and has been progressively having pain. Had an injection for the pain last week, without success. Full ROM.

## 2017-10-26 NOTE — Discharge Instructions (Addendum)
Your evaluated in the emergency department for right shoulder pain.  We are prescribing a short course of some narcotic pain medicine to help with the pain.  This medication may be sedating so please use caution with it.  Please contact your primary care doctor for further management of this.

## 2017-11-05 ENCOUNTER — Other Ambulatory Visit: Payer: Self-pay | Admitting: Nurse Practitioner

## 2017-12-02 ENCOUNTER — Other Ambulatory Visit (HOSPITAL_COMMUNITY): Payer: Self-pay | Admitting: Nurse Practitioner

## 2017-12-02 DIAGNOSIS — M542 Cervicalgia: Secondary | ICD-10-CM

## 2017-12-09 ENCOUNTER — Ambulatory Visit (HOSPITAL_COMMUNITY)
Admission: RE | Admit: 2017-12-09 | Discharge: 2017-12-09 | Disposition: A | Discharge status: Home / Self Care | Payer: Medicare Other | Source: Ambulatory Visit | Attending: Nurse Practitioner | Admitting: Nurse Practitioner

## 2017-12-09 DIAGNOSIS — M4802 Spinal stenosis, cervical region: Secondary | ICD-10-CM | POA: Insufficient documentation

## 2017-12-09 DIAGNOSIS — M5021 Other cervical disc displacement,  high cervical region: Secondary | ICD-10-CM | POA: Insufficient documentation

## 2017-12-09 DIAGNOSIS — M542 Cervicalgia: Secondary | ICD-10-CM | POA: Diagnosis present

## 2017-12-09 LAB — POCT I-STAT CREATININE: Creatinine, Ser: 0.9 mg/dL (ref 0.44–1.00)

## 2017-12-09 MED ORDER — GADOBUTROL 1 MMOL/ML IV SOLN
6.0000 mL | Freq: Once | INTRAVENOUS | Status: AC | PRN
  Administered 2017-12-09: 6 mL via INTRAVENOUS

## 2018-02-23 ENCOUNTER — Other Ambulatory Visit: Payer: Self-pay

## 2018-02-23 ENCOUNTER — Encounter (HOSPITAL_COMMUNITY): Payer: Self-pay

## 2018-02-23 ENCOUNTER — Emergency Department (HOSPITAL_COMMUNITY)
Admission: EM | Admit: 2018-02-23 | Discharge: 2018-02-23 | Disposition: A | Discharge status: Home / Self Care | Payer: Medicare Other | Attending: Emergency Medicine | Admitting: Emergency Medicine

## 2018-02-23 DIAGNOSIS — Z79899 Other long term (current) drug therapy: Secondary | ICD-10-CM | POA: Diagnosis not present

## 2018-02-23 DIAGNOSIS — Z8542 Personal history of malignant neoplasm of other parts of uterus: Secondary | ICD-10-CM | POA: Diagnosis not present

## 2018-02-23 DIAGNOSIS — E039 Hypothyroidism, unspecified: Secondary | ICD-10-CM | POA: Diagnosis not present

## 2018-02-23 DIAGNOSIS — I10 Essential (primary) hypertension: Secondary | ICD-10-CM | POA: Insufficient documentation

## 2018-02-23 DIAGNOSIS — J449 Chronic obstructive pulmonary disease, unspecified: Secondary | ICD-10-CM | POA: Diagnosis not present

## 2018-02-23 DIAGNOSIS — Z7982 Long term (current) use of aspirin: Secondary | ICD-10-CM | POA: Insufficient documentation

## 2018-02-23 DIAGNOSIS — J45909 Unspecified asthma, uncomplicated: Secondary | ICD-10-CM | POA: Diagnosis not present

## 2018-02-23 DIAGNOSIS — J029 Acute pharyngitis, unspecified: Secondary | ICD-10-CM | POA: Diagnosis present

## 2018-02-23 DIAGNOSIS — J069 Acute upper respiratory infection, unspecified: Secondary | ICD-10-CM | POA: Insufficient documentation

## 2018-02-23 LAB — GROUP A STREP BY PCR: Group A Strep by PCR: NOT DETECTED

## 2018-02-23 LAB — INFLUENZA PANEL BY PCR (TYPE A & B)
Influenza A By PCR: NEGATIVE
Influenza B By PCR: NEGATIVE

## 2018-02-23 MED ORDER — BENZONATATE 100 MG PO CAPS: 100.0000 mg | ORAL_CAPSULE | Freq: Three times a day (TID) | ORAL | 0 refills | Status: DC

## 2018-02-23 NOTE — ED Provider Notes (Signed)
[facility] EMERGENCY DEPARTMENT Provider Note   CSN: [id number] Arrival date & time: 02/23/18  8101     History   Chief Complaint Chief Complaint  Patient presents with  . Sore Throat    HPI Kelly Hebert is a 78 y.o. female.  HPI  Sore theroat and mild cough starting yesterday - no CP or SOB, no fevers,  Sx are persistent, gradually worsening, associated with no headache or body aches - husband has been sick recently.  No meds pta.  She has not had fever and had no meds prior to arrival.  No travel.  No CP  Past Medical History:  Diagnosis Date  . Acid reflux   . Anxiety   . Cancer [facility])    endometrial cancer  . Constipation   . COPD (chronic obstructive pulmonary disease) ([place])   . Depression   . Gout   . Heart murmur   . Hx of pyloric stenosis 11/14/2011  . Hypercholesteremia   . Hyperglycemia   . Hypertension   . Hypothyroidism   . Renal disorder    pelvic kidney  . S/P colonoscopy 2003   Dr. Nicolasa Ducking: normal colon, normal path.   . Vitamin D deficiency disease     Patient Active Problem List   Diagnosis Date Noted  . Hoarseness or changing voice 05/29/2017  . Early satiety   . Dyspepsia 05/17/2016  . Constipation 04/17/2012  . Dysuria 11/14/2011  . Dysphagia 02/19/2011  . Asthma 01/07/2011  . Benign hypertension 01/07/2011  . Hyperlipidemia 01/07/2011  . Anxiety 01/07/2011  . Hypothyroidism 01/07/2011  . GERD (gastroesophageal reflux disease) 01/07/2011    Past Surgical History:  Procedure Laterality Date  . ABDOMINAL HYSTERECTOMY     complete  . BREAST SURGERY     breast biopsy   . COLONOSCOPY  03/09/2011   BPZ:WCHENID diverticula, small internal hemorrhoids.   . ESOPHAGOGASTRODUODENOSCOPY   03/09/2011   POE:UMPNTIRWE in the distal esophagus/ Stenosis at the pylorus/ Mild gastritis/ Hiatal hernia. gastric bx showed gastritis but no H.pylori  . ESOPHAGOGASTRODUODENOSCOPY  03/20/11   SLF: stricture distal esophgus and stenosis at pyloris  likely from NSAIDs. s/p dialtion.   . ESOPHAGOGASTRODUODENOSCOPY N/A 01/24/2017   Procedure: ESOPHAGOGASTRODUODENOSCOPY (EGD);  Surgeon: Danie Binder, MD;  [facility];  Service: Endoscopy;  Laterality: N/A;  10:45am  . ESOPHAGOGASTRODUODENOSCOPY N/A 02/08/2017   Procedure: ESOPHAGOGASTRODUODENOSCOPY (EGD);  Surgeon: Danie Binder, MD;  [facility];  Service: Endoscopy;  Laterality: N/A;  12:45pm  . ESOPHAGOGASTRODUODENOSCOPY N/A 04/12/2017   Procedure: ESOPHAGOGASTRODUODENOSCOPY (EGD);  Surgeon: Danie Binder, MD;  [facility];  Service: Endoscopy;  Laterality: N/A;  1:30pm  . ESOPHAGOGASTRODUODENOSCOPY (EGD) WITH ESOPHAGEAL DILATION  11/23/2011   SLF: Stricture was found at the gastroesophageal junction/ Small hiatal hernia/  Sessile polyp was found in the gastric body and gastric fundus/ Stenosis was found at the pylorus/ The duodenal mucosa showed no abnormalities in the bulb and second portion of the duodenum  . ESOPHAGOGASTRODUODENOSCOPY (EGD) WITH ESOPHAGEAL DILATION  12/03/2011   RXV:QMGQQ was found in the entire examined stomach/Stricture was found at the gastroesophageal junction  . LAPAROSCOPY  2000   [place]-stomach pain-pt reports negative  . ROTATOR CUFF REPAIR    . SAVORY DILATION N/A 01/24/2017   Procedure: SAVORY DILATION;  Surgeon: Danie Binder, MD;  [facility];  Service: Endoscopy;  Laterality: N/A;  . SAVORY DILATION N/A 02/08/2017   Procedure: SAVORY DILATION;  Surgeon: Barney Drain  L, MD;  [facility];  Service: Endoscopy;  Laterality: N/A;  . SAVORY DILATION N/A 04/12/2017   Procedure: SAVORY DILATION;  Surgeon: Danie Binder, MD;  [facility];  Service: Endoscopy;  Laterality: N/A;     OB History    Gravida  3   Para  3   Term  3   Preterm      AB      Living  3     SAB      TAB      Ectopic      Multiple      Live Births               Home Medications    Prior  to Admission medications   Medication Sig Start Date End Date Taking? Authorizing Provider  acetaminophen (TYLENOL) 500 MG tablet Take 500 mg by mouth every 6 (six) hours as needed.   Yes [provider]  albuterol (PROVENTIL HFA;VENTOLIN HFA) 108 (90 BASE) MCG/ACT inhaler Inhale 2 puffs into the lungs every 6 (six) hours as needed for wheezing or shortness of breath.    Yes [provider]  amitriptyline (ELAVIL) 10 MG tablet Take 10 mg by mouth at bedtime.  07/29/12  Yes [provider]  amLODipine (NORVASC) 10 MG tablet Take 10 mg by mouth daily.   Yes [provider]  aspirin EC 81 MG tablet Take 81 mg by mouth daily.   Yes [provider]  atorvastatin (LIPITOR) 40 MG tablet Take 40 mg by mouth every evening.  12/31/16  Yes [provider]  benazepril-hydrochlorthiazide (LOTENSIN HCT) 20-25 MG tablet Take 1 tablet by mouth daily.  10/03/11  Yes [provider]  buPROPion (WELLBUTRIN XL) 150 MG 24 hr tablet Take 150 mg by mouth daily.    Yes [provider]  cholecalciferol (VITAMIN D) 1000 units tablet Take 2,000 Units by mouth daily.    Yes [provider]  citalopram (CELEXA) 40 MG tablet Take 40 mg by mouth daily.     Yes [provider]  DEXILANT 60 MG capsule TAKE 1 CAPSULE BY MOUTH EVERY DAY 10/23/17  Yes Carlis Stable, NP  etodolac (LODINE) 400 MG tablet Take 400 mg by mouth 2 (two) times daily.    Yes [provider]  fluticasone (FLOVENT HFA) 110 MCG/ACT inhaler Inhale 2 puffs into the lungs 2 (two) times daily as needed (for shortness of breath).    Yes [provider]  gabapentin (NEURONTIN) 300 MG capsule Take 300 mg by mouth 2 (two) times daily.  12/02/17  Yes [provider]  levothyroxine (SYNTHROID, LEVOTHROID) 50 MCG tablet Take 50 mcg by mouth daily before breakfast.  04/10/12  Yes [provider]  LINZESS 145 MCG CAPS capsule TAKE 1 CAPSULE BY MOUTH EVERY  DAY Patient taking differently: TAKE 1 CAPSULE BY MOUTH EVERY DAY AS NEEDED FOR CONSTIPATION 08/14/17  Yes Walden Field A, NP  LORazepam (ATIVAN) 1 MG tablet Take 1 mg by mouth 2 (two) times daily as needed for anxiety.    Yes [provider]  methocarbamol (ROBAXIN) 500 MG tablet Take 1 tablet (500 mg total) by mouth every 8 (eight) hours as needed for muscle spasms. 08/25/17  Yes Julianne Rice, MD  potassium chloride SA (K-DUR,KLOR-CON) 20 MEQ tablet TK 1 T PO QD WF 01/27/18  Yes [provider]  amoxicillin-clavulanate (AUGMENTIN) 875-125 MG tablet Take 1 tablet by mouth 2 (two) times  daily.     [provider]  benzonatate (TESSALON) 100 MG capsule Take 1 capsule (100 mg total) by mouth every 8 (eight) hours. 02/23/18   Noemi Chapel, MD  hydrocortisone (PROCTOZONE-HC) 2.5 % rectal cream Proctozone-HC 2.5 % topical cream perineal applicator    [provider]    Family History Family History  Problem Relation Age of Onset  . Kidney disease Sister   . Cirrhosis Brother        ?etoh  . Diabetes Brother   . Heart attack Brother   . Heart disease Mother   . Heart attack Mother   . Heart disease Father   . Heart attack Father   . Diabetes Brother   . Cirrhosis Sister        Non-etoh  . Colon cancer Neg Hx     Social History Social History   Tobacco Use  . Smoking status: Never Smoker  . Smokeless tobacco: Never Used  Substance Use Topics  . Alcohol use: No  . Drug use: No     Allergies   Ivp dye [iodinated diagnostic agents] and Nifedipine   Review of Systems Review of Systems  Constitutional: Negative for chills and fever.  HENT: Positive for sore throat.   Respiratory: Negative for cough and shortness of breath.   Cardiovascular: Negative for chest pain.  Gastrointestinal: Negative for nausea and vomiting.     Physical Exam Updated Vital Signs BP (!) 147/66 (BP Location: Right Arm)   Pulse 88   Temp 99.4 F (37.4 C) (Oral)    Resp 16   Ht 1.575 m (5\' 2" )   Wt 65.8 kg   SpO2 99%   BMI 26.52 kg/m   Physical Exam Vitals signs and nursing note reviewed.  Constitutional:      Appearance: She is well-developed. She is not diaphoretic.  HENT:     Head: Normocephalic and atraumatic.     Comments: No swelling of the tonsil with some erythrma, no exudate, normal phonation, mild Raspiness,   Eyes:     General:        Right eye: No discharge.        Left eye: No discharge.     Conjunctiva/sclera: Conjunctivae normal.  Neck:     Musculoskeletal: Normal range of motion and neck supple.     Thyroid: No thyromegaly.  Cardiovascular:     Rate and Rhythm: Normal rate and regular rhythm.     Heart sounds: No murmur. No gallop.   Pulmonary:     Effort: Pulmonary effort is normal. No respiratory distress.     Breath sounds: No wheezing or rales.  Lymphadenopathy:     Cervical: No cervical adenopathy.  Skin:    General: Skin is warm and dry.     Findings: No erythema or rash.  Neurological:     Mental Status: She is alert.     Coordination: Coordination normal.      ED Treatments / Results  Labs (all labs ordered are listed, but only abnormal results are displayed) Labs Reviewed  GROUP A STREP BY PCR  INFLUENZA PANEL BY PCR (TYPE A & B)    EKG None  Radiology No results found.  Procedures Procedures (including critical care time)  Medications Ordered in ED Medications - No data to display   Initial Impression / Assessment and Plan / ED Course  I have reviewed the triage vital signs and the nursing notes.  Pertinent labs & imaging results that were available  during my care of the patient were reviewed by me and considered in my medical decision making (see chart for details).     Exam consistent with either viral pharyngitis, presence of cough make strep less likely, does not seem like classic flulike illness.  No exudate, no lymphadenopathy, no fever, no wheezing, patient otherwise appears  well.  Suspect viral URI  Final Clinical Impressions(s) / ED Diagnoses   Final diagnoses:  Upper respiratory tract infection, unspecified type    ED Discharge Orders         Ordered    benzonatate (TESSALON) 100 MG capsule  Every 8 hours     02/23/18 1218           Noemi Chapel, MD 02/23/18 1219

## 2018-02-23 NOTE — ED Triage Notes (Signed)
Pt c/o sore throat and cough that started the previous day.

## 2018-02-23 NOTE — Discharge Instructions (Signed)
Your testing was normal You do not have the flu You do not have strep  See your doctor in 2 or 3 days for recheck  Tessalon, 100 mg every 8 hours as needed for coughing.

## 2018-04-27 IMAGING — CT CT CERVICAL SPINE W/O CM
3 of 4 series · 12 of 33 positions shown, 14 images · non-contrast
Comparison: Neck CTA 05/08/2016.  Cervical spine MRI 08/04/2009.

CLINICAL DATA: Right shoulder, neck, and arm pain.

EXAM:
CT CERVICAL SPINE WITHOUT CONTRAST
TECHNIQUE: Multidetector CT imaging of the cervical spine was performed without
intravenous contrast. Multiplanar CT image reconstructions were also
generated.

[Series 5: sagittal bone · sagittal · 0.25mm/px · 5 of 49 slices shown, 6 images]
[im 17/49  bone]
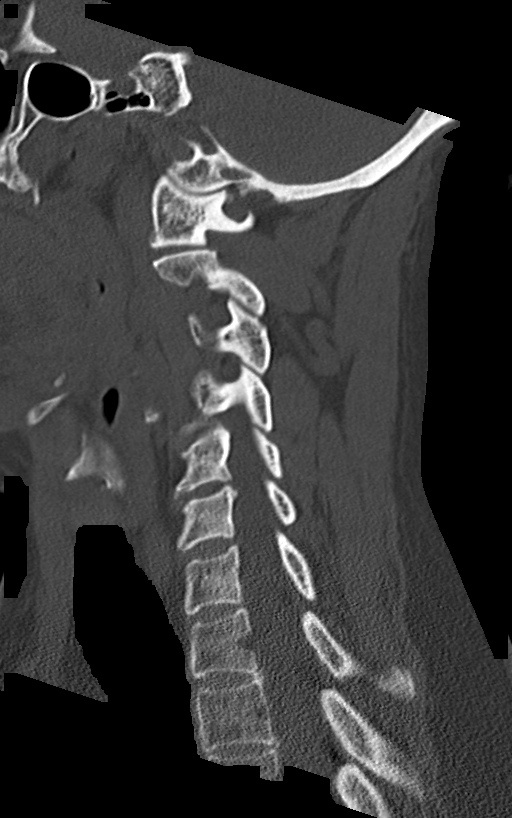
[im 21/49  bone]
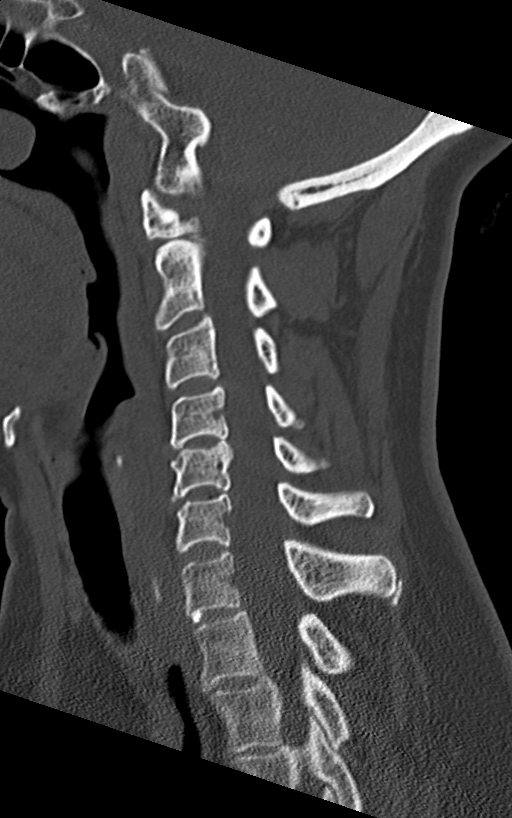
[im 25/49  soft-tissue]
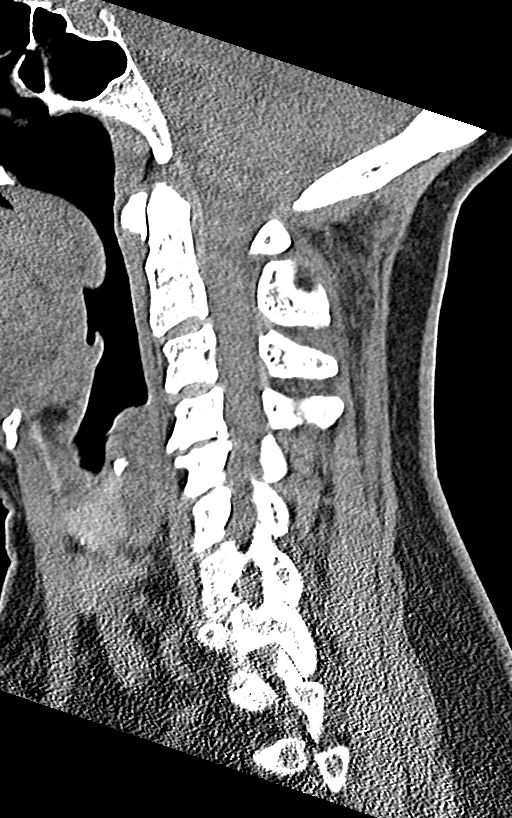
[im 25/49  bone]
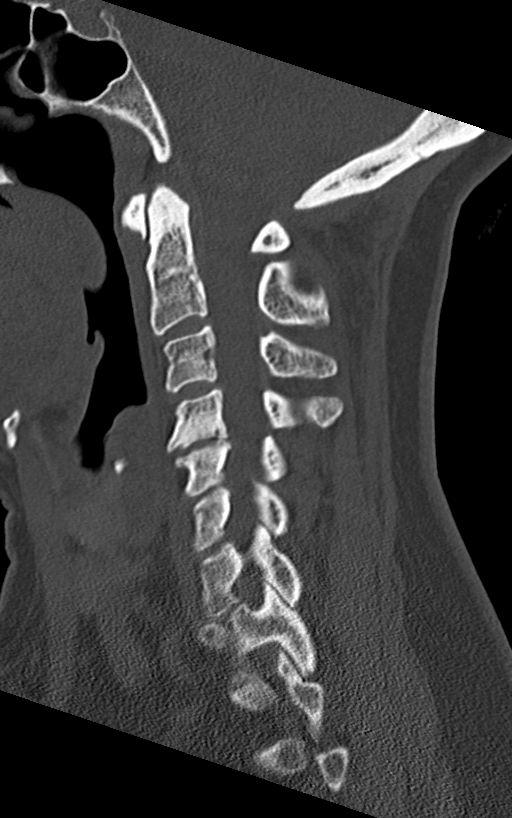
[im 29/49  bone]
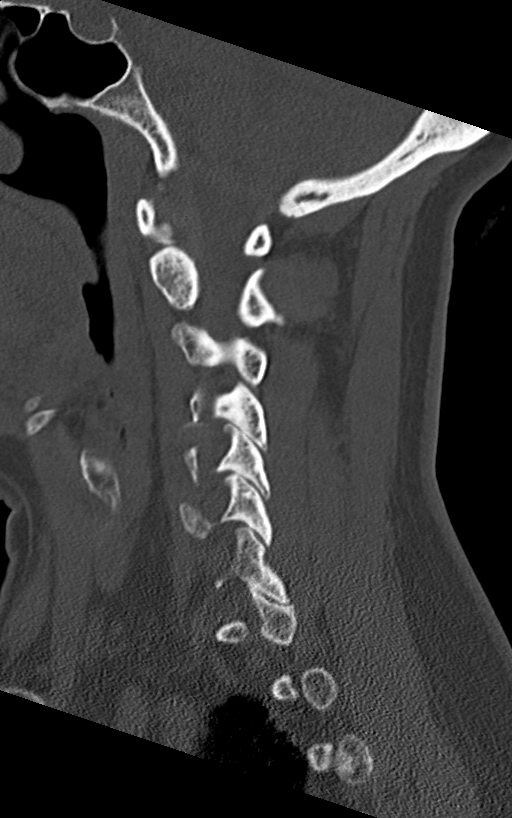
[im 33/49  bone]
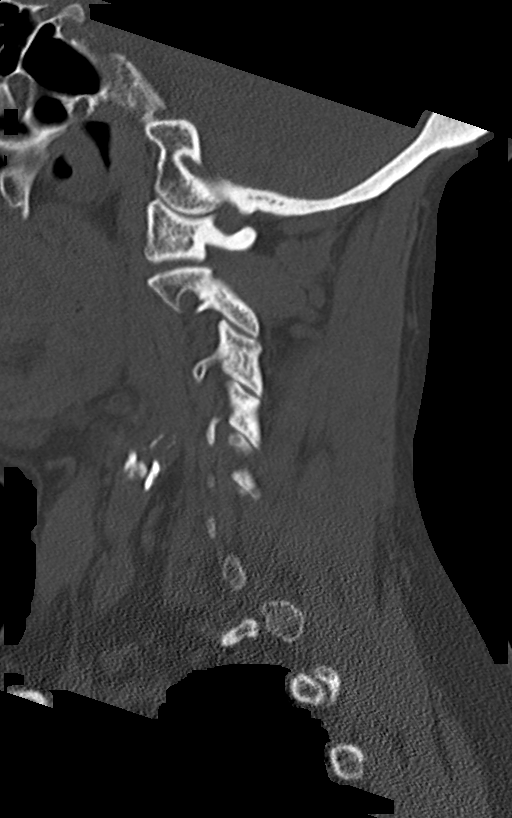

[Series 6: coronal bone · coronal · 0.25mm/px · 3 of 61 slices shown]
[im 13/61  bone]
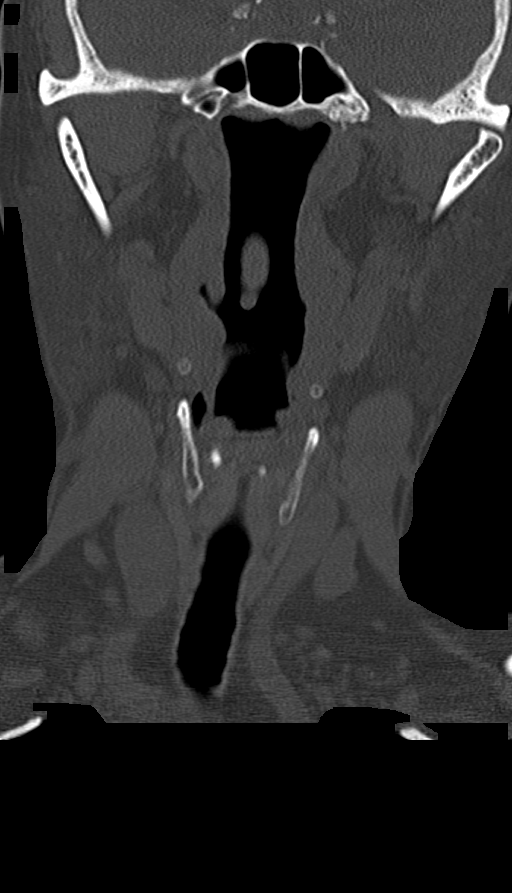
[im 25/61  bone]
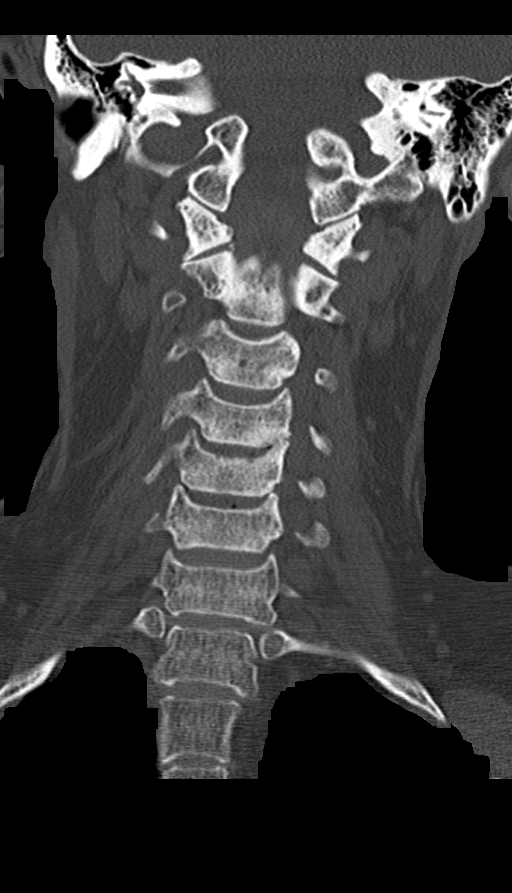
[im 37/61  bone]
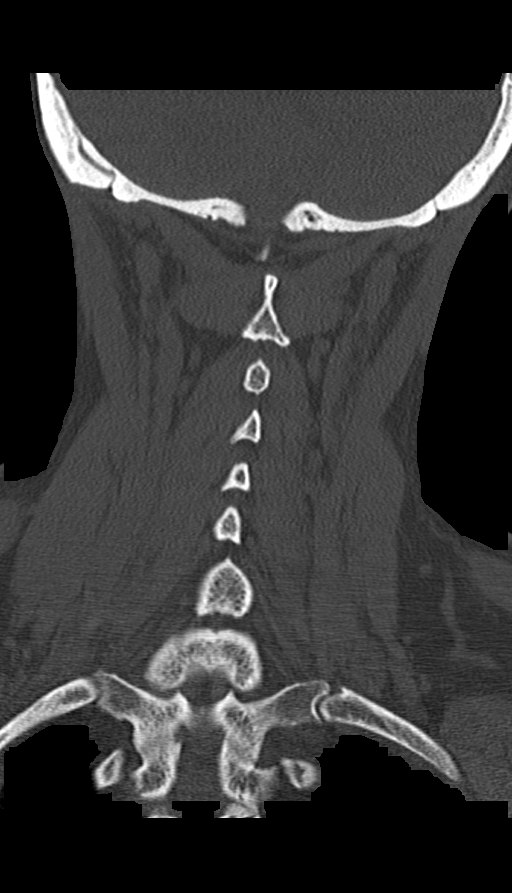

[Series 9: orthogonal bone · axial · 0.21mm/px · z∈[+1396,+1507]mm · 4 of 85 slices shown, 5 images]
[im 15/85  soft-tissue]
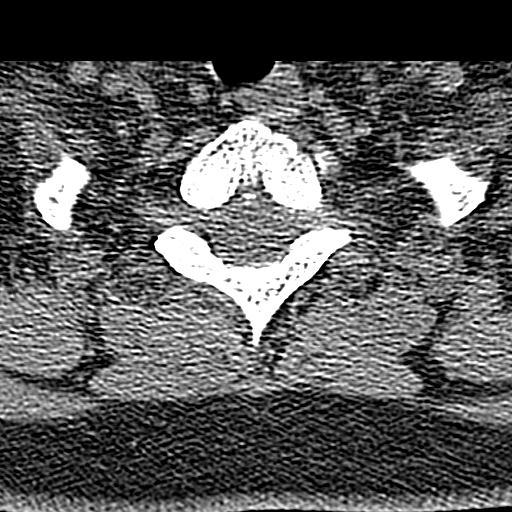
[im 15/85  bone]
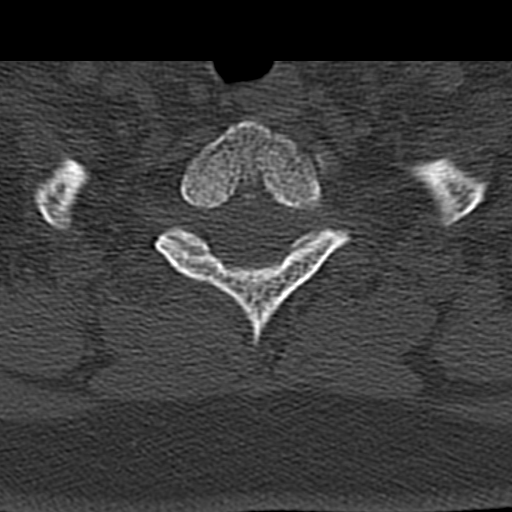
[im 29/85  bone]
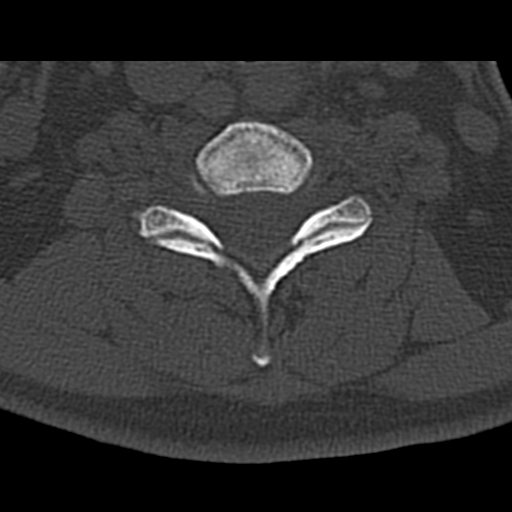
[im 57/85  bone]
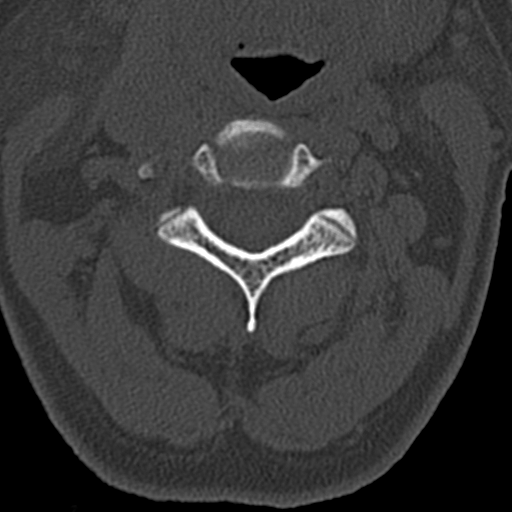
[im 71/85  bone]
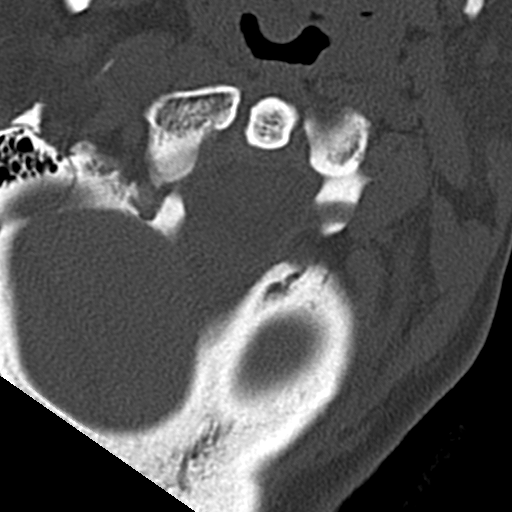

[12 of 33 positions shown; findings below may reference images not displayed]

FINDINGS: Alignment: Straightening/slight reversal the normal cervical
lordosis. No listhesis.

Skull base and vertebrae: No acute fracture or destructive osseous
process.

Soft tissues and spinal canal: No prevertebral fluid or swelling.

Disc levels:

C2-3:  Negative.

C3-4:  Negative.

C4-5: Mild-to-moderate disc space narrowing. Broad posterior disc
protrusion the results in likely moderate spinal stenosis, similar
to the prior MRI. Uncovertebral spurring and mild left facet
arthrosis result in similar mild left neural foraminal stenosis.

C5-6: Mild disc space narrowing. Broad posterior disc protrusion
results in likely moderate spinal stenosis, similar to the prior
MRI. No evidence of significant neural foraminal stenosis.

C6-7:  Negative.

C7-T1:  Negative.

Upper chest: Mild scarring in the lung apices.

Other: Calcification in the proximal cervical esophagus, unchanged.
Bulky calcified plaque in the carotid arteries the, evaluated in
detail on the prior CTA.
IMPRESSION: 1. Similar appearance of C4-5 and C5-6 disc degeneration compared to
the prior MRI with resultant moderate spinal stenosis.
2. No acute osseous abnormality.

## 2018-05-14 ENCOUNTER — Other Ambulatory Visit: Payer: Self-pay

## 2018-05-14 ENCOUNTER — Ambulatory Visit (INDEPENDENT_AMBULATORY_CARE_PROVIDER_SITE_OTHER): Payer: Medicare Other | Admitting: Gastroenterology

## 2018-05-14 ENCOUNTER — Encounter: Payer: Self-pay | Admitting: Gastroenterology

## 2018-05-14 DIAGNOSIS — K219 Gastro-esophageal reflux disease without esophagitis: Secondary | ICD-10-CM | POA: Diagnosis not present

## 2018-05-14 DIAGNOSIS — K5900 Constipation, unspecified: Secondary | ICD-10-CM | POA: Diagnosis not present

## 2018-05-14 NOTE — Patient Instructions (Signed)
I am glad you are doing well!  Continue Dexilant once daily and Linzess as needed.  Please call if you need anything.  We will see you in 6-8 months!  It was a pleasure to talk to you today. I strive to create trusting relationships with patients to provide genuine, compassionate, and quality care. I value your feedback. If you receive a survey regarding your visit,  I greatly appreciate you taking time to fill this out.   Annitta Needs, PhD, ANP-BC [facility]

## 2018-05-14 NOTE — Progress Notes (Addendum)
REVIEWED-NO ADDITIONAL RECOMMENDATIONS.     Primary Care Physician:  [name], NP  Primary GI: Dr. Oneida Alar   Virtual Visit via Telephone Note Due to COVID-19, visit is conducted virtually and was requested by patient.   I connected with Kelly Hebert on 05/14/18 at  3:00 PM EDT by telephone and verified that I am speaking with the correct person using two identifiers.   I discussed the limitations, risks, security and privacy concerns of performing an evaluation and management service by telephone and the availability of in person appointments. I also discussed with the patient that there may be a patient responsible charge related to this service. The patient expressed understanding and agreed to proceed.  Chief Complaint  Patient presents with  . Nausea    sometimes when she eats and will last 5-10 minutes  . poor appetite  . Abdominal Pain    was told by PCP it was her nerves causing it     History of Present Illness: 78 year old female with history of chronic GERD, constipation, dyspepsia. Last seen a year ago.   Every once in awhile after eating, will get nauseated for 5-10 minutes. No vomiting. Sometimes with waking will feel hungry but then doesn't have an appetite. Had gas pain on Sunday. No dysphagia. Eats four times a day. Weight recently 146, which is stable from her baseline. Takes Linzess 145 mcg about twice a week. If eats a lot of fruit doesn't have to take Linzess often. No rectal bleeding.   Depressed right now. Wants to "do things but can't get it done". Tree needs to be cut down but will cost $2000 and is frustrating. No homicidal or suicidal ideation.   Past Medical History:  Diagnosis Date  . Acid reflux   . Anxiety   . Cancer [facility])    endometrial cancer  . Constipation   . COPD (chronic obstructive pulmonary disease) ([place])   . Depression   . Gout   . Heart murmur   . Hx of pyloric stenosis 11/14/2011  . Hypercholesteremia   .  Hyperglycemia   . Hypertension   . Hypothyroidism   . Renal disorder    pelvic kidney  . S/P colonoscopy 2003   Dr. Nicolasa Ducking: normal colon, normal path.   . Vitamin D deficiency disease      Past Surgical History:  Procedure Laterality Date  . ABDOMINAL HYSTERECTOMY     complete  . BREAST SURGERY     breast biopsy   . COLONOSCOPY  03/09/2011   [facility]:ENIDPOE diverticula, small internal hemorrhoids.   . ESOPHAGOGASTRODUODENOSCOPY   03/09/2011   [facility]:[id number] in the distal esophagus/ Stenosis at the pylorus/ Mild gastritis/ Hiatal hernia. gastric bx showed gastritis but no H.pylori  . ESOPHAGOGASTRODUODENOSCOPY  03/20/11   SLF: stricture distal esophgus and stenosis at pyloris likely from NSAIDs. s/p dialtion.   . ESOPHAGOGASTRODUODENOSCOPY N/A 01/24/2017   Procedure: ESOPHAGOGASTRODUODENOSCOPY (EGD);  Surgeon: Danie Binder, MD;  [facility];  Service: Endoscopy;  Laterality: N/A;  10:45am  . ESOPHAGOGASTRODUODENOSCOPY N/A 02/08/2017   Procedure: ESOPHAGOGASTRODUODENOSCOPY (EGD);  Surgeon: Danie Binder, MD;  [facility];  Service: Endoscopy;  Laterality: N/A;  12:45pm  . ESOPHAGOGASTRODUODENOSCOPY N/A 04/12/2017   Procedure: ESOPHAGOGASTRODUODENOSCOPY (EGD);  Surgeon: Danie Binder, MD;  [facility];  Service: Endoscopy;  Laterality: N/A;  1:30pm  . ESOPHAGOGASTRODUODENOSCOPY (EGD) WITH ESOPHAGEAL DILATION  11/23/2011   SLF: Stricture was found at the gastroesophageal junction/ Small hiatal hernia/  Sessile polyp was found in the gastric body and gastric fundus/ Stenosis was found at the pylorus/ The duodenal mucosa showed no abnormalities in the bulb and second portion of the duodenum  . ESOPHAGOGASTRODUODENOSCOPY (EGD) WITH ESOPHAGEAL DILATION  12/03/2011   [facility]:[id number] was found in the entire examined stomach/Stricture was found at the gastroesophageal junction  . LAPAROSCOPY  2000   [facility]-stomach pain-pt reports negative  . ROTATOR CUFF REPAIR     . SAVORY DILATION N/A 01/24/2017   Procedure: SAVORY DILATION;  Surgeon: Danie Binder, MD;  [facility];  Service: Endoscopy;  Laterality: N/A;  . SAVORY DILATION N/A 02/08/2017   Procedure: SAVORY DILATION;  Surgeon: Danie Binder, MD;  [facility];  Service: Endoscopy;  Laterality: N/A;  . SAVORY DILATION N/A 04/12/2017   Procedure: SAVORY DILATION;  Surgeon: Danie Binder, MD;  [facility];  Service: Endoscopy;  Laterality: N/A;     Current Meds  Medication Sig  . acetaminophen (TYLENOL) 500 MG tablet Take 500 mg by mouth every 6 (six) hours as needed.  Marland Kitchen albuterol (PROVENTIL HFA;VENTOLIN HFA) 108 (90 BASE) MCG/ACT inhaler Inhale 2 puffs into the lungs every 6 (six) hours as needed for wheezing or shortness of breath.   Marland Kitchen amitriptyline (ELAVIL) 10 MG tablet Take 10 mg by mouth at bedtime.   Marland Kitchen amLODipine (NORVASC) 10 MG tablet Take 10 mg by mouth daily.  Marland Kitchen aspirin EC 81 MG tablet Take 81 mg by mouth daily.  Marland Kitchen atorvastatin (LIPITOR) 40 MG tablet Take 40 mg by mouth every evening.   . benazepril-hydrochlorthiazide (LOTENSIN HCT) 20-25 MG tablet Take 1 tablet by mouth daily.   . cholecalciferol (VITAMIN D) 1000 units tablet Take 2,000 Units by mouth daily.   . citalopram (CELEXA) 40 MG tablet Take 40 mg by mouth daily.    Marland Kitchen DEXILANT 60 MG capsule TAKE 1 CAPSULE BY MOUTH EVERY DAY  . fluticasone (FLOVENT HFA) 110 MCG/ACT inhaler Inhale 2 puffs into the lungs 2 (two) times daily as needed (for shortness of breath).   Marland Kitchen levothyroxine (SYNTHROID, LEVOTHROID) 50 MCG tablet Take 50 mcg by mouth daily before breakfast.   . LINZESS 145 MCG CAPS capsule TAKE 1 CAPSULE BY MOUTH EVERY DAY (Patient taking differently: TAKE 1 CAPSULE BY MOUTH EVERY DAY AS NEEDED FOR CONSTIPATION)  . LORazepam (ATIVAN) 1 MG tablet Take 1 mg by mouth 2 (two) times daily as needed for anxiety.       Review of Systems: Gen: Denies fever, chills, anorexia. Denies fatigue, weakness,  weight loss.  CV: Denies chest pain, palpitations, syncope, peripheral edema, and claudication. Resp: Denies dyspnea at rest, cough, wheezing, coughing up blood, and pleurisy. GI: see HPI Derm: Denies rash, itching, dry skin Psych: Denies depression, anxiety, memory loss, confusion. No homicidal or suicidal ideation.  Heme: Denies bruising, bleeding, and enlarged lymph nodes.  Observations/Objective: No distress. Unable to perform physical exam due to telephone encounter. No video available.   Assessment and Plan: 78 year old female with chronic GI issues at baseline. GERD controlled with Dexilant. Utilizing novel dosing of Linzess only as needed several times a week. Chronic depression and anxiety at baseline without alarm features. Will have her return in 6-8 months.   Follow Up Instructions: See AVS   I discussed the assessment and treatment plan with the patient. The patient was provided an opportunity to ask questions and all were answered. The patient agreed with the plan and demonstrated an understanding of the instructions.  The patient was advised to call back or seek an in-person evaluation if the symptoms worsen or if the condition fails to improve as anticipated.  I provided 15 minutes of non-face-to-face time during this encounter.  Annitta Needs, PhD, ANP-BC [facility]

## 2018-05-15 NOTE — Progress Notes (Signed)
cc'ed to pcp °

## 2018-09-03 ENCOUNTER — Other Ambulatory Visit: Payer: Self-pay

## 2018-09-03 ENCOUNTER — Encounter: Payer: Self-pay | Admitting: Gastroenterology

## 2018-09-03 ENCOUNTER — Ambulatory Visit (INDEPENDENT_AMBULATORY_CARE_PROVIDER_SITE_OTHER): Payer: Medicare Other | Admitting: Gastroenterology

## 2018-09-03 DIAGNOSIS — K219 Gastro-esophageal reflux disease without esophagitis: Secondary | ICD-10-CM | POA: Diagnosis not present

## 2018-09-03 DIAGNOSIS — R1013 Epigastric pain: Secondary | ICD-10-CM | POA: Diagnosis not present

## 2018-09-03 NOTE — Patient Instructions (Addendum)
EAT TO LIVE AND THINKS OF FOOD AS MEDICINE.   DRINK WATER TO KEEP YOUR URINE LIGHT YELLOW.   FOLLOW A HIGH FIBER DIET.    TO INCREASE ENERGY, AND REDUCE GI SYMPTOMS, I RECOMMEND YOU READ AND FOLLOW RECOMMENDATIONS BY DR. MARK HYMAN, "10-DAY DETOX DIET".   EAT EZEKIEL BREAD. IT IS IN THE FROZEN SECTION OF THE GROCERY STORE.   Do not drink SODA, GATORADE, ENERGY DRINKS, OR DIET SODA. AVOID HIGH FRUCTOSE CORN SYRUP. DO NOT chew SUGAR FREE GUM, OR USE ARTIFICIAL SWEETENERS. USE STEVIA AS A SWEETENER.   DO NOT EAT ENRICHED WHEAT FLOUR, PASTA, OR CEREAL.   DO NOT EAT FARM RAISED MEAT OR FISH. ONLY EAT GRASS FED BEEF OR CHICKEN AND EGGS.     FOLLOW UP IN 6 MOS.

## 2018-09-03 NOTE — Assessment & Plan Note (Signed)
SYMPTOMS CONTROLLED/RESOLVED.  CONTINUE DEXILANT FOLLOW UP IN 6 MOS.  

## 2018-09-03 NOTE — Assessment & Plan Note (Signed)
SYMPTOMS FAIRLY WELL CONTROLLED.  EAT TO LIVE AND THINKS OF FOOD AS MEDICINE. DRINK WATER TO KEEP YOUR URINE LIGHT YELLOW. FOLLOW A HIGH FIBER DIET.  AVOID ITEMS THAT CAUSE BLOATING & GAS. TO IMPROVE ENERGY AND IMPROVE PHYSICAL AND MENTAL HEALTH, I RECOMMEND YOU READ AND FOLLOW RECOMMENDATIONS BY DR. MARK HYMAN, "10-DAY DETOX DIET". EAT EZEKIEL BREAD. IT IS IN THE FROZEN SECTION OF THE GROCERY STORE. WHEN YOU TAKE VITAMIN D PILLS, GET 8 OZ WATER AND ADD FIBER PILLS OR POWDER WITH EACH GLASS OF WATER.  Do not drink SODA, GATORADE, ENERGY DRINKS OR DIET SODA. AVOID HIGH FRUCTOSE CORN SYRUP. DO NOT chew SUGAR FREE GUM, OR USE ARTIFICIAL SWEETENERS. USE STEVIA AS A SWEETENER. DO NOT EAT ENRICHED WHEAT FLOUR, PASTA, OR CEREAL. DO NOT FARM RAISED MEAT OR FISH. ONLY WANT TO EAT GRASS FED BEEF OR CHICKEN.  FOLLOW UP IN 6 MOS.

## 2018-09-03 NOTE — Progress Notes (Signed)
Subjective:    Patient ID: Kelly Hebert, female    DOB: February 05, 1940, 78 y.o.   MRN: [id number]  [name], NP   HPI HAVING DULL UPPER ABDOMINAL PAIN(CENTERAL AND MOVE TO RUQ). LAST WEEK. IF SHE WOULD TAKE A DEEP BREATH IT WOULD BE SHARP. LASTED ALL DAY THEN IT WOULD COME AND GO. NO CHANGE IN BOWEL HABITS. NAUSEA UNCHANGED BUT CAN GET IT AFTER HER PILLS. NOW GETTING BETTER SINCE MON. HAD TROUBLE WITH ACID REFLUX LAST WEEK: [place] PETE SAUCE ON CHICKEN WINGS(BAKED)(ON SAT). RARELY FEEL SPILL STOP IN ESOPHAGUS. CONSTIPATION WHNE STARTED QUARANTINE BUT NO LINZESS SINCE LAST WEEK AND HAS NL BM. IF TAKES TWO SHE GETS DIARRHEA.  PT DENIES FEVER, CHILLS, HEMATOCHEZIA, HEMATEMESIS, vomiting, melena, CHEST PAIN, SHORTNESS OF BREATH, OR CHANGE IN BOWEL IN HABITS.  Past Medical History:  Diagnosis Date  . Acid reflux   . Anxiety   . Cancer [facility])    endometrial cancer  . Constipation   . COPD (chronic obstructive pulmonary disease) ([place])   . Depression   . Gout   . Heart murmur   . Hx of pyloric stenosis 11/14/2011  . Hypercholesteremia   . Hyperglycemia   . Hypertension   . Hypothyroidism   . Renal disorder    pelvic kidney  . S/P colonoscopy 2003   Dr. Nicolasa Ducking: normal colon, normal path.   . Vitamin D deficiency disease    Past Surgical History:  Procedure Laterality Date  . ABDOMINAL HYSTERECTOMY     complete  . BREAST SURGERY     breast biopsy   . COLONOSCOPY  03/09/2011   [facility]:[id number] diverticula, small internal hemorrhoids.   . ESOPHAGOGASTRODUODENOSCOPY   03/09/2011   [facility]:[id number] in the distal esophagus/ Stenosis at the pylorus/ Mild gastritis/ Hiatal hernia. gastric bx showed gastritis but no H.pylori  . ESOPHAGOGASTRODUODENOSCOPY  03/20/11   SLF: stricture distal esophgus and stenosis at pyloris likely from NSAIDs. s/p dialtion.   . ESOPHAGOGASTRODUODENOSCOPY N/A 01/24/2017   Procedure: ESOPHAGOGASTRODUODENOSCOPY (EGD);  Surgeon: Danie Binder, MD;  [facility];  Service: Endoscopy;  Laterality: N/A;  10:45am  . ESOPHAGOGASTRODUODENOSCOPY N/A 02/08/2017   Procedure: ESOPHAGOGASTRODUODENOSCOPY (EGD);  Surgeon: Danie Binder, MD;  [facility];  Service: Endoscopy;  Laterality: N/A;  12:45pm  . ESOPHAGOGASTRODUODENOSCOPY N/A 04/12/2017   Procedure: ESOPHAGOGASTRODUODENOSCOPY (EGD);  Surgeon: Danie Binder, MD;  [facility];  Service: Endoscopy;  Laterality: N/A;  1:30pm  . ESOPHAGOGASTRODUODENOSCOPY (EGD) WITH ESOPHAGEAL DILATION  11/23/2011   SLF: Stricture was found at the gastroesophageal junction/ Small hiatal hernia/  Sessile polyp was found in the gastric body and gastric fundus/ Stenosis was found at the pylorus/ The duodenal mucosa showed no abnormalities in the bulb and second portion of the duodenum  . ESOPHAGOGASTRODUODENOSCOPY (EGD) WITH ESOPHAGEAL DILATION  12/03/2011   XQJ:JHERD was found in the entire examined stomach/Stricture was found at the gastroesophageal junction  . LAPAROSCOPY  2000   [place]-stomach pain-pt reports negative  . ROTATOR CUFF REPAIR    . SAVORY DILATION N/A 01/24/2017   Procedure: SAVORY DILATION;  Surgeon: Danie Binder, MD;  [facility];  Service: Endoscopy;  Laterality: N/A;  . SAVORY DILATION N/A 02/08/2017   Procedure: SAVORY DILATION;  Surgeon: Danie Binder, MD;  [facility];  Service: Endoscopy;  Laterality: N/A;  . SAVORY DILATION N/A 04/12/2017   Procedure: SAVORY DILATION;  Surgeon: Danie Binder, MD;  [facility];  Service: Endoscopy;  Laterality: N/A;   Allergies  Allergen Reactions  . Ivp Dye [Iodinated Diagnostic Agents] Hives, Itching and Other (See Comments)    Pt. States this happened 4 or 5 years ago in [place].    . Nifedipine Nausea And Vomiting and Other (See Comments)    unknown    Current Outpatient Medications  Medication Sig    . acetaminophen (TYLENOL) 500 MG tablet Take 500 mg by mouth every 6 (six)  hours as needed.    Marland Kitchen albuterol (PROVENTIL HFA;VENTOLIN HFA) 108 (90 BASE) MCG/ACT inhaler Inhale 2 puffs into the lungs every 6 (six) hours as needed for wheezing or shortness of breath.     Marland Kitchen amitriptyline (ELAVIL) 10 MG tablet Take 10 mg by mouth at bedtime.     . NORVASC 10 MG tablet Take 10 mg by mouth daily.    Marland Kitchen aspirin EC 81 MG tablet Take 81 mg by mouth daily.    Marland Kitchen atorvastatin (LIPITOR) 40 MG tablet Take 40 mg by mouth every evening.     . benazepril-hydrochlorthiazide (LOTENSIN HCT) 20-25 MG tablet Take 1 tablet by mouth daily.     . citalopram (CELEXA) 40 MG tablet Take 40 mg by mouth daily.      Marland Kitchen DEXILANT 60 MG capsule TAKE 1 CAPSULE BY MOUTH EVERY DAY    . fluticasone (FLOVENT HFA) 110 MCG/ACT inhaler Inhale 2 puffs into the lungs 2 (two) times daily as needed (for shortness of breath).     . SYNTHROID 50 MCG tablet Take 50 mcg by mouth daily before breakfast.     . LINZESS 145 MCG CAPS capsule  TAKE 1 CAPSULE BY MOUTH EVERY DAY AS NEEDED FOR CONSTIPATION)    . LORazepam (ATIVAN) 1 MG tablet Take 1 mg by mouth 2 (two) times daily as needed for anxiety.     . Vitamin D, Ergocalciferol, (DRISDOL) 1.25 MG (50000 UT) CAPS capsule Take 1 capsule by mouth once a week.    . cholecalciferol (VITAMIN D) 1000 units tablet Take 2,000 Units by mouth daily.      Review of Systems PER HPI OTHERWISE ALL SYSTEMS ARE NEGATIVE.    Objective:   Physical Exam Vitals signs reviewed.  Constitutional:      General: She is not in acute distress.    Appearance: She is well-developed.  HENT:     Head: Normocephalic and atraumatic.     Mouth/Throat:     Pharynx: No oropharyngeal exudate.  Eyes:     General: No scleral icterus.    Pupils: Pupils are equal, round, and reactive to light.  Neck:     Musculoskeletal: Normal range of motion and neck supple.  Cardiovascular:     Rate and Rhythm: Normal rate and regular rhythm.     Heart sounds: Normal heart sounds.  Pulmonary:     Effort:  Pulmonary effort is normal. No respiratory distress.     Breath sounds: Normal breath sounds.  Abdominal:     General: Bowel sounds are normal. There is no distension.     Palpations: Abdomen is soft.     Tenderness: There is no abdominal tenderness.  Lymphadenopathy:     Cervical: No cervical adenopathy.  Neurological:     Mental Status: She is alert and oriented to person, place, and time.       Assessment & Plan:

## 2018-09-04 NOTE — Progress Notes (Signed)
ON RECALL  °

## 2018-10-21 ENCOUNTER — Encounter: Payer: Self-pay | Admitting: Gastroenterology

## 2019-01-24 ENCOUNTER — Other Ambulatory Visit: Payer: Self-pay | Admitting: Gastroenterology

## 2019-01-27 ENCOUNTER — Other Ambulatory Visit: Payer: Self-pay | Admitting: Gastroenterology

## 2019-01-27 NOTE — Telephone Encounter (Signed)
Dexilant sent to pharmacy

## 2019-02-24 ENCOUNTER — Encounter: Payer: Self-pay | Admitting: Gastroenterology

## 2019-04-08 ENCOUNTER — Telehealth: Payer: Self-pay | Admitting: Obstetrics and Gynecology

## 2019-04-08 NOTE — Telephone Encounter (Signed)

## 2019-04-09 ENCOUNTER — Other Ambulatory Visit: Payer: Self-pay

## 2019-04-09 ENCOUNTER — Encounter: Payer: Self-pay | Admitting: Obstetrics and Gynecology

## 2019-04-09 ENCOUNTER — Ambulatory Visit (INDEPENDENT_AMBULATORY_CARE_PROVIDER_SITE_OTHER): Payer: Medicare HMO | Admitting: Obstetrics and Gynecology

## 2019-04-09 VITALS — BP 149/77 | HR 71 | Ht 62.0 in | Wt 142.4 lb

## 2019-04-09 DIAGNOSIS — R1030 Lower abdominal pain, unspecified: Secondary | ICD-10-CM

## 2019-04-09 DIAGNOSIS — Z1211 Encounter for screening for malignant neoplasm of colon: Secondary | ICD-10-CM | POA: Diagnosis not present

## 2019-04-09 LAB — POCT URINALYSIS DIPSTICK
Blood, UA: NEGATIVE
Glucose, UA: NEGATIVE
Ketones, UA: NEGATIVE
Leukocytes, UA: NEGATIVE
Nitrite, UA: NEGATIVE
Protein, UA: NEGATIVE

## 2019-04-09 LAB — HEMOCCULT GUIAC POC 1CARD (OFFICE): Fecal Occult Blood, POC: NEGATIVE

## 2019-04-09 NOTE — Patient Instructions (Signed)
Abdominal or Pelvic Ultrasound An ultrasound is a test that uses sound waves to take pictures of the inside of the body. An abdominal ultrasound takes pictures of the inside of your belly (abdomen). A pelvic ultrasound takes pictures of the inside of the area between your hip bones (pelvis). An ultrasound may be done to check an organ or look for problems. This is a safe test that does not hurt. It is done by placing a handheld device called a transducer on the outside of your belly or pelvis and moving it around. Tell your doctor about:  Any allergies you have.  All medicines you are taking.  Any surgeries you have had.  Any medical conditions you have.  Whether you are pregnant or may be pregnant. What are the risks? There are no known risks from having this test. What happens before the procedure?  Follow instructions from your doctor about eating or drinking before the test.  Wear clothing that is easy to wash. Gel from the test might get on your clothes. What happens during the procedure?   You will lie on an exam table.  Your clothes will be moved so your belly and pelvis are showing.  A gel will be put on your skin. It may feel cool.  The transducer device will be put on your skin. It will be moved back and forth over the area being looked at.  The device will take pictures. They will show on small TV screens.  You may be asked to change your position.  After the exam, the gel will be cleaned off. What happens after the procedure?  It is up to you to get the results of your test. Ask your doctor, or the department that is doing the test, when your results will be ready.  Keep all follow-up visits as told by your doctor. This is important. Summary  An ultrasound is a test that uses sound waves to take pictures of the inside of the body.  An ultrasound may be done to check an organ or look for problems.  The test is done by moving a handheld device around on the  outside of your belly or pelvis.  It is up to you to get the results of your test. Be sure to ask when your results will be ready. This information is not intended to replace advice given to you by your health care provider. Make sure you discuss any questions you have with your health care provider. Document Revised: 08/05/2017 Document Reviewed: 08/05/2017 Elsevier Patient Education  2020 [employer]  

## 2019-04-09 NOTE — Progress Notes (Signed)
[facility] Visit  @DATE @            Patient name: Kelly Hebert MRN [id number]  Date of birth: Jun 04, 1940  CC & HPI:  Kelly Hebert is a 79 y.o. female presenting today for evaluation of intermittent pelvic pain in the suprapubic area.  This 79 year old female is now 10 years status post hysterectomy where she describes "everything was removed through abdominal incision.  No records currently available  ROS:  ROS negative for weight loss weight gain, negative for bleeding per bowel movement No fever chills.  Bowel movements normal 3-4 times per week.  She old records have shown her to have a tendency toward constipation if she takes Bentyl  Pertinent History Reviewed:   Reviewed: Significant for irritable bowel on [place]         Past Medical History:  Diagnosis Date  . Acid reflux   . Anxiety   . Cancer [facility])    endometrial cancer  . Constipation   . COPD (chronic obstructive pulmonary disease) ([place])   . Depression   . Gout   . Heart murmur   . Hx of pyloric stenosis 11/14/2011  . Hypercholesteremia   . Hyperglycemia   . Hypertension   . Hypothyroidism   . Renal disorder    pelvic kidney  . S/P colonoscopy 2003   Dr. Nicolasa Ducking: normal colon, normal path.   . Vitamin D deficiency disease                               Surgical Hx:    Past Surgical History:  Procedure Laterality Date  . ABDOMINAL HYSTERECTOMY     complete  . BREAST SURGERY     breast biopsy   . COLONOSCOPY  03/09/2011   [id number] diverticula, small internal hemorrhoids.   . ESOPHAGOGASTRODUODENOSCOPY   03/09/2011   [id number] in the distal esophagus/ Stenosis at the pylorus/ Mild gastritis/ Hiatal hernia. gastric bx showed gastritis but no H.pylori  . ESOPHAGOGASTRODUODENOSCOPY  03/20/11   SLF: stricture distal esophgus and stenosis at pyloris likely from NSAIDs. s/p dialtion.   . ESOPHAGOGASTRODUODENOSCOPY N/A 01/24/2017   Procedure: ESOPHAGOGASTRODUODENOSCOPY (EGD);  Surgeon:  Danie Binder, MD;  [facility];  Service: Endoscopy;  Laterality: N/A;  10:45am  . ESOPHAGOGASTRODUODENOSCOPY N/A 02/08/2017   Procedure: ESOPHAGOGASTRODUODENOSCOPY (EGD);  Surgeon: Danie Binder, MD;  [facility];  Service: Endoscopy;  Laterality: N/A;  12:45pm  . ESOPHAGOGASTRODUODENOSCOPY N/A 04/12/2017   Procedure: ESOPHAGOGASTRODUODENOSCOPY (EGD);  Surgeon: Danie Binder, MD;  [facility];  Service: Endoscopy;  Laterality: N/A;  1:30pm  . ESOPHAGOGASTRODUODENOSCOPY (EGD) WITH ESOPHAGEAL DILATION  11/23/2011   SLF: Stricture was found at the gastroesophageal junction/ Small hiatal hernia/  Sessile polyp was found in the gastric body and gastric fundus/ Stenosis was found at the pylorus/ The duodenal mucosa showed no abnormalities in the bulb and second portion of the duodenum  . ESOPHAGOGASTRODUODENOSCOPY (EGD) WITH ESOPHAGEAL DILATION  12/03/2011   [id number] was found in the entire examined stomach/Stricture was found at the gastroesophageal junction  . LAPAROSCOPY  2000   [facility]-stomach pain-pt reports negative  . ROTATOR CUFF REPAIR    . SAVORY DILATION N/A 01/24/2017   Procedure: SAVORY DILATION;  Surgeon: Danie Binder, MD;  [facility];  Service: Endoscopy;  Laterality: N/A;  . SAVORY DILATION N/A 02/08/2017   Procedure: SAVORY DILATION;  Surgeon:  Fields, Marga Melnick, MD;  [facility];  Service: Endoscopy;  Laterality: N/A;  . SAVORY DILATION N/A 04/12/2017   Procedure: SAVORY DILATION;  Surgeon: Danie Binder, MD;  [facility];  Service: Endoscopy;  Laterality: N/A;   Medications: Reviewed & Updated - see associated section                       Current Outpatient Medications:  .  acetaminophen (TYLENOL) 500 MG tablet, Take 500 mg by mouth every 6 (six) hours as needed., Disp: , Rfl:  .  albuterol (PROVENTIL HFA;VENTOLIN HFA) 108 (90 BASE) MCG/ACT inhaler, Inhale 2 puffs into the lungs every 6 (six) hours as  needed for wheezing or shortness of breath. , Disp: , Rfl:  .  amitriptyline (ELAVIL) 10 MG tablet, Take 10 mg by mouth at bedtime. , Disp: , Rfl:  .  amLODipine (NORVASC) 10 MG tablet, Take 10 mg by mouth daily., Disp: , Rfl:  .  aspirin EC 81 MG tablet, Take 81 mg by mouth daily., Disp: , Rfl:  .  atorvastatin (LIPITOR) 40 MG tablet, Take 40 mg by mouth every evening. , Disp: , Rfl: 5 .  benazepril-hydrochlorthiazide (LOTENSIN HCT) 20-25 MG tablet, Take 1 tablet by mouth daily. , Disp: , Rfl:  .  buPROPion (WELLBUTRIN XL) 150 MG 24 hr tablet, , Disp: , Rfl:  .  cholecalciferol (VITAMIN D) 1000 units tablet, Take 2,000 Units by mouth daily. , Disp: , Rfl:  .  citalopram (CELEXA) 40 MG tablet, Take 40 mg by mouth daily.  , Disp: , Rfl:  .  DEXILANT 60 MG capsule, TAKE 1 CAPSULE BY MOUTH EVERY DAY, Disp: 30 capsule, Rfl: 5 .  fluticasone (FLOVENT HFA) 110 MCG/ACT inhaler, Inhale 2 puffs into the lungs 2 (two) times daily as needed (for shortness of breath). , Disp: , Rfl:  .  gabapentin (NEURONTIN) 300 MG capsule, Take 300 mg by mouth at bedtime., Disp: , Rfl:  .  levothyroxine (SYNTHROID, LEVOTHROID) 50 MCG tablet, Take 50 mcg by mouth daily before breakfast. , Disp: , Rfl:  .  LINZESS 145 MCG CAPS capsule, TAKE 1 CAPSULE BY MOUTH EVERY DAY (Patient taking differently: TAKE 1 CAPSULE BY MOUTH EVERY DAY AS NEEDED FOR CONSTIPATION), Disp: 90 capsule, Rfl: 3 .  LORazepam (ATIVAN) 1 MG tablet, Take 1 mg by mouth 2 (two) times daily as needed for anxiety. , Disp: , Rfl:  .  potassium chloride (KLOR-CON) 20 MEQ packet, Take 20 mEq by mouth once., Disp: , Rfl:  .  Vitamin D, Ergocalciferol, (DRISDOL) 1.25 MG (50000 UT) CAPS capsule, Take 1 capsule by mouth once a week., Disp: , Rfl:    Social History: Reviewed -  reports that she has never smoked. She has never used smokeless tobacco.  Objective Findings:  Vitals: Blood pressure (!) 149/77, pulse 71, height 5\' 2"  (1.575 m), weight 142 lb 6.4 oz (64.6  kg).  PHYSICAL EXAMINATION General appearance - alert, well appearing, and in no distress and normal appearing weight Mental status - alert, oriented to person, place, and time, normal mood, behavior, speech, dress, motor activity, and thought processes Chest - no tachypnea, retractions or cyanosis Heart - normal rate, regular rhythm, normal S1, S2, no murmurs, rubs, clicks or gallops, normal rate and regular rhythm Abdomen - soft, nontender, nondistended, no masses or organomegaly Breasts - pe breast exam:[id number]::"breasts appear normal, no suspicious masses, no skin or nipple changes or axillary nodes  Skin - normal coloration and turgor, no rashes, no suspicious skin lesions noted No lesions noted PELVIC External genitalia -normal appearance for age vulva -postmenopausal appearance Vagina -atrophic good support Cervix surgically absent Uterus - ent} Adnexa -some stiffness and vaginal cuff area just to the left of the midline.  There is a thin fibrous band in the midline shortening the affected vaginal length.  (Patient not sexually active) [place] -  Rectal - normal rectal, no masses, guaiac negative stool obtained    Assessment & Plan:   A:  1. Pelvic pain, reproduced by palpation of vaginal cuff 2. Vaginal cuff firmness and thickness on the left side of midline  P:  1. Transvaginal ultrasound pelvis complete in this office, 1 week 2. Return after ultrasound for results discussion 3.

## 2019-04-21 ENCOUNTER — Other Ambulatory Visit: Payer: Self-pay | Admitting: Obstetrics and Gynecology

## 2019-04-21 ENCOUNTER — Telehealth: Payer: Self-pay | Admitting: Obstetrics and Gynecology

## 2019-04-21 DIAGNOSIS — R1030 Lower abdominal pain, unspecified: Secondary | ICD-10-CM

## 2019-04-21 NOTE — Telephone Encounter (Signed)

## 2019-04-23 ENCOUNTER — Ambulatory Visit (INDEPENDENT_AMBULATORY_CARE_PROVIDER_SITE_OTHER): Payer: Medicare HMO | Admitting: Obstetrics and Gynecology

## 2019-04-23 ENCOUNTER — Other Ambulatory Visit: Payer: Self-pay

## 2019-04-23 ENCOUNTER — Encounter: Payer: Self-pay | Admitting: Obstetrics and Gynecology

## 2019-04-23 ENCOUNTER — Ambulatory Visit (INDEPENDENT_AMBULATORY_CARE_PROVIDER_SITE_OTHER): Payer: Medicare HMO

## 2019-04-23 ENCOUNTER — Other Ambulatory Visit: Payer: Self-pay | Admitting: Obstetrics and Gynecology

## 2019-04-23 VITALS — BP 160/77 | HR 92 | Ht 62.0 in | Wt 142.0 lb

## 2019-04-23 DIAGNOSIS — R1030 Lower abdominal pain, unspecified: Secondary | ICD-10-CM

## 2019-04-23 DIAGNOSIS — G8929 Other chronic pain: Secondary | ICD-10-CM

## 2019-04-23 DIAGNOSIS — R1031 Right lower quadrant pain: Secondary | ICD-10-CM | POA: Diagnosis not present

## 2019-04-23 DIAGNOSIS — R1032 Left lower quadrant pain: Secondary | ICD-10-CM | POA: Diagnosis not present

## 2019-04-23 NOTE — Progress Notes (Signed)
Patient ID: DONESHIA CATER, female   DOB: Oct 31, 1940, 79 y.o.   MRN: [id number]    [facility] Visit  @DATE @            Patient name: Kelly Hebert MRN [id number]  Date of birth: 06-25-1940  CC & HPI:  Kelly Hebert is a 79 y.o. female presenting today for review of TV vaginal ultrasound for intermittent pelvic pain in the suprapubic area.  She reports the abdominal pain is been more noticeable intermittently for the last 6 to 8 months.  She is taken Tylenol for the discomfort which works for a couple of hours.  She is unable to take ibuprofen due to stomach discomfort  She notes that back when she used to work in [employer], she had to lift 25-30 lbs over her head.   She states that Dr. Kathlen Mody at the [facility], who did her hysterectomy, did not believe her pain was due to her kidney,  There is no mention of weight loss or weight gain nausea or vomiting   ROS:  Review of Systems  Constitutional: Negative for diaphoresis, fever, malaise/fatigue and weight loss.  HENT: Negative for congestion and sore throat.   Eyes: Negative for blurred vision and double vision.  Respiratory: Negative for cough and shortness of breath.   Cardiovascular: Negative for chest pain, palpitations and leg swelling.  Gastrointestinal: Positive for abdominal pain (6-8 months). Negative for constipation, diarrhea, nausea and vomiting.  Genitourinary: Negative for frequency and urgency.  Musculoskeletal: Negative for back pain, falls and myalgias.  Skin: Negative for rash.  Neurological: Negative for dizziness, weakness and headaches.  Psychiatric/Behavioral: Negative for depression. The patient is not nervous/anxious.    TV vaginal ultrasound on 04/23/2019 revealed thickened vaginal cuff, ovaries not visualized, left pelvic kidney that is within the pelvis only a couple centimeters from the vaginal cuff, there is some vaginal cuff pain during ultrasound, no free fluid.   Pertinent History  Reviewed:   Reviewed: Significant for  Medical         Past Medical History:  Diagnosis Date  . Acid reflux   . Anxiety   . Cancer [facility])    endometrial cancer  . Constipation   . COPD (chronic obstructive pulmonary disease) ([place])   . Depression   . Gout   . Heart murmur   . Hx of pyloric stenosis 11/14/2011  . Hypercholesteremia   . Hyperglycemia   . Hypertension   . Hypothyroidism   . Renal disorder    pelvic kidney  . S/P colonoscopy 2003   Dr. Nicolasa Ducking: normal colon, normal path.   . Vitamin D deficiency disease                               Surgical Hx:    Past Surgical History:  Procedure Laterality Date  . ABDOMINAL HYSTERECTOMY     complete  . BREAST SURGERY     breast biopsy   . COLONOSCOPY  03/09/2011   [id number] diverticula, small internal hemorrhoids.   . ESOPHAGOGASTRODUODENOSCOPY   03/09/2011   [id number] in the distal esophagus/ Stenosis at the pylorus/ Mild gastritis/ Hiatal hernia. gastric bx showed gastritis but no H.pylori  . ESOPHAGOGASTRODUODENOSCOPY  03/20/11   SLF: stricture distal esophgus and stenosis at pyloris likely from NSAIDs. s/p dialtion.   . ESOPHAGOGASTRODUODENOSCOPY N/A 01/24/2017   Procedure: ESOPHAGOGASTRODUODENOSCOPY (EGD);  Surgeon: Danie Binder, MD;  [facility];  Service: Endoscopy;  Laterality: N/A;  10:45am  . ESOPHAGOGASTRODUODENOSCOPY N/A 02/08/2017   Procedure: ESOPHAGOGASTRODUODENOSCOPY (EGD);  Surgeon: Danie Binder, MD;  [facility];  Service: Endoscopy;  Laterality: N/A;  12:45pm  . ESOPHAGOGASTRODUODENOSCOPY N/A 04/12/2017   Procedure: ESOPHAGOGASTRODUODENOSCOPY (EGD);  Surgeon: Danie Binder, MD;  [facility];  Service: Endoscopy;  Laterality: N/A;  1:30pm  . ESOPHAGOGASTRODUODENOSCOPY (EGD) WITH ESOPHAGEAL DILATION  11/23/2011   SLF: Stricture was found at the gastroesophageal junction/ Small hiatal hernia/  Sessile polyp was found in the gastric body and gastric fundus/ Stenosis was  found at the pylorus/ The duodenal mucosa showed no abnormalities in the bulb and second portion of the duodenum  . ESOPHAGOGASTRODUODENOSCOPY (EGD) WITH ESOPHAGEAL DILATION  12/03/2011   [id number] was found in the entire examined stomach/Stricture was found at the gastroesophageal junction  . LAPAROSCOPY  2000   [facility]-stomach pain-pt reports negative  . ROTATOR CUFF REPAIR    . SAVORY DILATION N/A 01/24/2017   Procedure: SAVORY DILATION;  Surgeon: Danie Binder, MD;  [facility];  Service: Endoscopy;  Laterality: N/A;  . SAVORY DILATION N/A 02/08/2017   Procedure: SAVORY DILATION;  Surgeon: Danie Binder, MD;  [facility];  Service: Endoscopy;  Laterality: N/A;  . SAVORY DILATION N/A 04/12/2017   Procedure: SAVORY DILATION;  Surgeon: Danie Binder, MD;  [facility];  Service: Endoscopy;  Laterality: N/A;   Medications: Reviewed & Updated - see associated section                       Current Outpatient Medications:  .  acetaminophen (TYLENOL) 500 MG tablet, Take 500 mg by mouth every 6 (six) hours as needed., Disp: , Rfl:  .  albuterol (PROVENTIL HFA;VENTOLIN HFA) 108 (90 BASE) MCG/ACT inhaler, Inhale 2 puffs into the lungs every 6 (six) hours as needed for wheezing or shortness of breath. , Disp: , Rfl:  .  amitriptyline (ELAVIL) 10 MG tablet, Take 10 mg by mouth at bedtime. , Disp: , Rfl:  .  amLODipine (NORVASC) 10 MG tablet, Take 10 mg by mouth daily., Disp: , Rfl:  .  aspirin EC 81 MG tablet, Take 81 mg by mouth daily., Disp: , Rfl:  .  atorvastatin (LIPITOR) 40 MG tablet, Take 40 mg by mouth every evening. , Disp: , Rfl: 5 .  benazepril-hydrochlorthiazide (LOTENSIN HCT) 20-25 MG tablet, Take 1 tablet by mouth daily. , Disp: , Rfl:  .  buPROPion (WELLBUTRIN XL) 150 MG 24 hr tablet, , Disp: , Rfl:  .  cholecalciferol (VITAMIN D) 1000 units tablet, Take 2,000 Units by mouth daily. , Disp: , Rfl:  .  citalopram (CELEXA) 40 MG tablet, Take 40 mg  by mouth daily.  , Disp: , Rfl:  .  DEXILANT 60 MG capsule, TAKE 1 CAPSULE BY MOUTH EVERY DAY, Disp: 30 capsule, Rfl: 5 .  fluticasone (FLOVENT HFA) 110 MCG/ACT inhaler, Inhale 2 puffs into the lungs 2 (two) times daily as needed (for shortness of breath). , Disp: , Rfl:  .  gabapentin (NEURONTIN) 300 MG capsule, Take 300 mg by mouth at bedtime., Disp: , Rfl:  .  levothyroxine (SYNTHROID, LEVOTHROID) 50 MCG tablet, Take 50 mcg by mouth daily before breakfast. , Disp: , Rfl:  .  LINZESS 145 MCG CAPS capsule, TAKE 1 CAPSULE BY MOUTH EVERY DAY (Patient taking differently: TAKE 1 CAPSULE BY MOUTH EVERY DAY AS NEEDED  FOR CONSTIPATION), Disp: 90 capsule, Rfl: 3 .  LORazepam (ATIVAN) 1 MG tablet, Take 1 mg by mouth 2 (two) times daily as needed for anxiety. , Disp: , Rfl:  .  potassium chloride (KLOR-CON) 20 MEQ packet, Take 20 mEq by mouth once., Disp: , Rfl:  .  Vitamin D, Ergocalciferol, (DRISDOL) 1.25 MG (50000 UT) CAPS capsule, Take 1 capsule by mouth once a week., Disp: , Rfl:    Social History: Reviewed -  reports that she has never smoked. She has never used smokeless tobacco.  Objective Findings:  Vitals: There were no vitals taken for this visit.  PHYSICAL EXAMINATION General appearance - alert, well appearing, and in no distress, oriented to person, place, and time and normal appearing weight Mental status - alert, oriented to person, place, and time Chest - not examined Heart - not examined Abdomen - not examined Breasts - not examined Skin - normal coloration and turgor, no rashes, no suspicious skin lesions noted Pelvic exam, done with chaperone and ultrasound technician present during the pelvic ultrasound shows well supported vaginal cuff with some thickening in firmness to the cuff particular on the left side of the vaginal cuff.  Assessment & Plan:   A:  1. Pelvic sensitivity at vaginal cuff many years status post hysterectomy (complete hysterectomy per patient  description) 2. Pelvic kidney 3. No acute findings to explain the low-grade discomfort over the past few months 4. Follow-up 6 weeks to see how she is done on the Tylenol 3  P:  1. Prescribe Tylenol #3 x 20 tablets to use as needed moderate pain  2. Follow up with PCP, Ahmed Prima, Laurita Quint, NP     I personally performed the services described in this documentation, which was SCRIBED by Jacqualyn Posey  in my presence. The recorded information has been reviewed and considered accurate. It has been edited as necessary during review. Jonnie Kind, MD

## 2019-04-23 NOTE — Progress Notes (Signed)
PELVIC US TA/TV:thickened vaginal cuff,ovaries not visualized,left pelvic kidney,vaginal cuff pain during ultrasound,no free fluid,Dr Glo Herring reviewed images during ultrasound

## 2019-05-14 ENCOUNTER — Telehealth: Payer: Self-pay | Admitting: Obstetrics and Gynecology

## 2019-05-14 NOTE — Telephone Encounter (Signed)
Please review my call note .

## 2019-05-14 NOTE — Telephone Encounter (Signed)
Daughter called said mom is in a lot of pain in med in med has appt with [facility] for follow up but needs refill on pain meds to last. Daughter said she is needing today please . [facility] in [place]

## 2019-05-14 NOTE — Telephone Encounter (Signed)
Phone call to patient who has complaints of the same sort of pelvic discomfort that she has had for for a long time . She has not been seen by urologist for evaluation of the pelvic kidney, to see if it has anything to do with the pain. I am not certain that gyn issues are contributing to her pelvic discomfort.   She had her hysterectomy years ago, and while there has been some chronic pelvic relaxation, she does not have evidence of a change in her body that I can explain as the cause of her discomfort.   Pt is to try ibuprofen PRN, intermittently, and in limited dosing, and reach out to primary care, for a possible referral to urology. I could try fitting a pessary to see if pelvic support is a contribution to the discomfort, but I don't have any other suggestions.

## 2019-06-02 NOTE — Progress Notes (Signed)
PATIENT ID: Kelly Hebert, female     DOB: [birth year], 79 y.o.     MRN: [id number]   [facility] Visit  06/02/19     PATIENT NAME: Kelly Hebert     MRN [id number]     DOB: [birth year]  CC & HPI:  Kelly Hebert is a 79 y.o. female presenting today for chronic bilateral lower abdominal pain. She was prescribed Tylenol #3 x20 at the last visit for the pain, and is unable to take ibuprofen due to stomach discomfort.  She has had pelvic and lower abdominal discomfort which I do not think is GYN in origin.  The patient is wanting to make sure we have done all to rule out GYN solutions.  She has had a hysterectomy.  Were going to put in a pessary and see if it gives her any improved support.  Ring pessary will be sulfated  ROS:  Review of Systems  Constitutional: Negative for diaphoresis, fever, malaise/fatigue and weight loss.  HENT: Negative for congestion and sore throat.   Eyes: Negative for blurred vision and double vision.  Respiratory: Negative for cough and shortness of breath.   Cardiovascular: Negative for chest pain, palpitations and leg swelling.  Gastrointestinal: Positive for abdominal pain (6-8 months). Negative for constipation, diarrhea, nausea and vomiting.  Genitourinary: Negative for frequency and urgency.  Musculoskeletal: Negative for back pain, falls and myalgias.  Skin: Negative for rash.  Neurological: Negative for dizziness, weakness and headaches.  Psychiatric/Behavioral: Negative for depression. The patient is not nervous/anxious.     Pertinent History Reviewed:  Reviewed: Significant for recent ultrasound which was negative.  Exam shows good vaginal cuff support, with l with asymmetric support on the patient's left Medical         Past Medical History:  Diagnosis Date  . Acid reflux   . Anxiety   . Cancer [facility])    endometrial cancer  . Constipation   . COPD (chronic obstructive pulmonary disease) ([place])   . Depression   . Gout   . Heart murmur    . Hx of pyloric stenosis 11/14/2011  . Hypercholesteremia   . Hyperglycemia   . Hypertension   . Hypothyroidism   . Renal disorder    pelvic kidney  . S/P colonoscopy 2003   Dr. Nicolasa Ducking: normal colon, normal path.   . Vitamin D deficiency disease                               Surgical Hx:    Past Surgical History:  Procedure Laterality Date  . ABDOMINAL HYSTERECTOMY     complete  . BREAST SURGERY     breast biopsy   . COLONOSCOPY  03/09/2011   [id number] diverticula, small internal hemorrhoids.   . ESOPHAGOGASTRODUODENOSCOPY   03/09/2011   [id number] in the distal esophagus/ Stenosis at the pylorus/ Mild gastritis/ Hiatal hernia. gastric bx showed gastritis but no H.pylori  . ESOPHAGOGASTRODUODENOSCOPY  03/20/11   SLF: stricture distal esophgus and stenosis at pyloris likely from NSAIDs. s/p dialtion.   . ESOPHAGOGASTRODUODENOSCOPY N/A 01/24/2017   Procedure: ESOPHAGOGASTRODUODENOSCOPY (EGD);  Surgeon: Danie Binder, MD;  [facility];  Service: Endoscopy;  Laterality: N/A;  10:45am  . ESOPHAGOGASTRODUODENOSCOPY N/A 02/08/2017   Procedure: ESOPHAGOGASTRODUODENOSCOPY (EGD);  Surgeon: Danie Binder, MD;  [facility];  Service: Endoscopy;  Laterality: N/A;  12:45pm  . ESOPHAGOGASTRODUODENOSCOPY N/A 04/12/2017   Procedure:  ESOPHAGOGASTRODUODENOSCOPY (EGD);  Surgeon: Danie Binder, MD;  [facility];  Service: Endoscopy;  Laterality: N/A;  1:30pm  . ESOPHAGOGASTRODUODENOSCOPY (EGD) WITH ESOPHAGEAL DILATION  11/23/2011   SLF: Stricture was found at the gastroesophageal junction/ Small hiatal hernia/  Sessile polyp was found in the gastric body and gastric fundus/ Stenosis was found at the pylorus/ The duodenal mucosa showed no abnormalities in the bulb and second portion of the duodenum  . ESOPHAGOGASTRODUODENOSCOPY (EGD) WITH ESOPHAGEAL DILATION  12/03/2011   [id number] was found in the entire examined stomach/Stricture was found at the gastroesophageal  junction  . LAPAROSCOPY  2000   [place]-stomach pain-pt reports negative  . ROTATOR CUFF REPAIR    . SAVORY DILATION N/A 01/24/2017   Procedure: SAVORY DILATION;  Surgeon: Danie Binder, MD;  [facility];  Service: Endoscopy;  Laterality: N/A;  . SAVORY DILATION N/A 02/08/2017   Procedure: SAVORY DILATION;  Surgeon: Danie Binder, MD;  [facility];  Service: Endoscopy;  Laterality: N/A;  . SAVORY DILATION N/A 04/12/2017   Procedure: SAVORY DILATION;  Surgeon: Danie Binder, MD;  [facility];  Service: Endoscopy;  Laterality: N/A;   Medications: Reviewed & Updated - see associated section                       Current Outpatient Medications:  .  acetaminophen (TYLENOL) 500 MG tablet, Take 500 mg by mouth every 6 (six) hours as needed., Disp: , Rfl:  .  albuterol (PROVENTIL HFA;VENTOLIN HFA) 108 (90 BASE) MCG/ACT inhaler, Inhale 2 puffs into the lungs every 6 (six) hours as needed for wheezing or shortness of breath. , Disp: , Rfl:  .  amitriptyline (ELAVIL) 10 MG tablet, Take 10 mg by mouth at bedtime. , Disp: , Rfl:  .  amLODipine (NORVASC) 10 MG tablet, Take 10 mg by mouth daily., Disp: , Rfl:  .  aspirin EC 81 MG tablet, Take 81 mg by mouth daily., Disp: , Rfl:  .  atorvastatin (LIPITOR) 40 MG tablet, Take 40 mg by mouth every evening. , Disp: , Rfl: 5 .  benazepril-hydrochlorthiazide (LOTENSIN HCT) 20-25 MG tablet, Take 1 tablet by mouth daily. , Disp: , Rfl:  .  buPROPion (WELLBUTRIN XL) 150 MG 24 hr tablet, , Disp: , Rfl:  .  cholecalciferol (VITAMIN D) 1000 units tablet, Take 2,000 Units by mouth daily. , Disp: , Rfl:  .  citalopram (CELEXA) 40 MG tablet, Take 40 mg by mouth daily.  , Disp: , Rfl:  .  DEXILANT 60 MG capsule, TAKE 1 CAPSULE BY MOUTH EVERY DAY, Disp: 30 capsule, Rfl: 5 .  fluticasone (FLOVENT HFA) 110 MCG/ACT inhaler, Inhale 2 puffs into the lungs 2 (two) times daily as needed (for shortness of breath). , Disp: , Rfl:  .   gabapentin (NEURONTIN) 300 MG capsule, Take 300 mg by mouth at bedtime., Disp: , Rfl:  .  levothyroxine (SYNTHROID, LEVOTHROID) 50 MCG tablet, Take 50 mcg by mouth daily before breakfast. , Disp: , Rfl:  .  LINZESS 145 MCG CAPS capsule, TAKE 1 CAPSULE BY MOUTH EVERY DAY (Patient taking differently: TAKE 1 CAPSULE BY MOUTH EVERY DAY AS NEEDED FOR CONSTIPATION), Disp: 90 capsule, Rfl: 3 .  LORazepam (ATIVAN) 1 MG tablet, Take 1 mg by mouth 2 (two) times daily as needed for anxiety. , Disp: , Rfl:  .  potassium chloride (KLOR-CON) 20 MEQ packet, Take 20 mEq by mouth once., Disp: ,  Rfl:  .  Vitamin D, Ergocalciferol, (DRISDOL) 1.25 MG (50000 UT) CAPS capsule, Take 1 capsule by mouth once a week., Disp: , Rfl:    Social History: Reviewed -  reports that she has never smoked. She has never used smokeless tobacco.  Objective Findings:  Vitals: There were no vitals taken for this visit.  PHYSICAL EXAMINATION General appearance - alert, well appearing, and in no distress, oriented to person, place, and time and normal appearing weight Mental status - alert, oriented to person, place, and time Chest - clear to auscultation, no wheezes, rales or rhonchi, symmetric air entry Heart - normal rate and regular rhythm Abdomen - soft, nontender, nondistended, no masses or organomegaly Breasts -  Skin - normal coloration and turgor, no rashes, no suspicious skin lesions noted  PELVIC External genitalia -normal for age Vulva -good perineal support Vagina -short vaginal length, fitted with 2 inch pessary ring pessary with drain sites Cervix - surgically absent} Uterus - surgically absent} Adnexa -no masses [place] -  Rectal - rectal exam not indicated  Assessment & Plan:   A:  1. Pessary fitting for evaluation due to chronic lower abdominal and pelvic discomfort  P:  1. Follow-up 24 hours for recheck.  Will wear test pessary home   By signing my name below, I, [employer], attest that this  documentation has been prepared under the direction and in the presence of Jonnie Kind, MD. Electronically Signed: [place]. 06/02/19. 4:38 PM.  I personally performed the services described in this documentation, which was SCRIBED in my presence. The recorded information has been reviewed and considered accurate. It has been edited as necessary during review. Jonnie Kind, MD

## 2019-06-03 ENCOUNTER — Telehealth: Payer: Self-pay | Admitting: Obstetrics and Gynecology

## 2019-06-03 NOTE — Telephone Encounter (Signed)

## 2019-06-04 ENCOUNTER — Other Ambulatory Visit: Payer: Self-pay

## 2019-06-04 ENCOUNTER — Ambulatory Visit (INDEPENDENT_AMBULATORY_CARE_PROVIDER_SITE_OTHER): Payer: Medicare HMO | Admitting: Obstetrics and Gynecology

## 2019-06-04 ENCOUNTER — Encounter: Payer: Self-pay | Admitting: Obstetrics and Gynecology

## 2019-06-04 VITALS — BP 156/75 | HR 81 | Ht 62.0 in | Wt 144.2 lb

## 2019-06-04 DIAGNOSIS — Z4689 Encounter for fitting and adjustment of other specified devices: Secondary | ICD-10-CM

## 2019-06-04 DIAGNOSIS — Z466 Encounter for fitting and adjustment of urinary device: Secondary | ICD-10-CM

## 2019-06-04 DIAGNOSIS — R1030 Lower abdominal pain, unspecified: Secondary | ICD-10-CM | POA: Diagnosis not present

## 2019-06-04 DIAGNOSIS — G8929 Other chronic pain: Secondary | ICD-10-CM

## 2019-06-05 ENCOUNTER — Ambulatory Visit (INDEPENDENT_AMBULATORY_CARE_PROVIDER_SITE_OTHER): Payer: Medicare HMO | Admitting: Obstetrics and Gynecology

## 2019-06-05 ENCOUNTER — Other Ambulatory Visit: Payer: Self-pay

## 2019-06-05 ENCOUNTER — Telehealth: Payer: Self-pay | Admitting: Obstetrics and Gynecology

## 2019-06-05 ENCOUNTER — Encounter: Payer: Self-pay | Admitting: Obstetrics and Gynecology

## 2019-06-05 VITALS — BP 175/79 | HR 86 | Ht 62.0 in | Wt 143.2 lb

## 2019-06-05 DIAGNOSIS — R1030 Lower abdominal pain, unspecified: Secondary | ICD-10-CM | POA: Diagnosis not present

## 2019-06-05 DIAGNOSIS — Z4689 Encounter for fitting and adjustment of other specified devices: Secondary | ICD-10-CM

## 2019-06-05 NOTE — Telephone Encounter (Signed)
error 

## 2019-06-05 NOTE — Progress Notes (Signed)
Patient ID: Kelly Hebert, female   DOB: 1940-11-23, 79 y.o.   MRN: [id number]    [facility] Visit  @DATE @            Patient name: Kelly Hebert MRN [id number]  Date of birth: 19-Jun-1940  CC & HPI:  Kelly Hebert is a 79 y.o. female presenting today for pessary removal that was inserted yesterday. She states that the pessary did not improve her abdominal pain.  ROS:  ROS + abdominal pain lower abdomen symmetric midline.  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  The hysterectomy was many years ago  Pertinent History Reviewed:   Reviewed Medical         Past Medical History:  Diagnosis Date  . Acid reflux   . Anxiety   . Cancer [facility])    endometrial cancer  . Constipation   . COPD (chronic obstructive pulmonary disease) ([place])   . Depression   . Gout   . Heart murmur   . Hx of pyloric stenosis 11/14/2011  . Hypercholesteremia   . Hyperglycemia   . Hypertension   . Hypothyroidism   . Renal disorder    pelvic kidney  . S/P colonoscopy 2003   Dr. Nicolasa Ducking: normal colon, normal path.   . Vitamin D deficiency disease                               Surgical Hx:    Past Surgical History:  Procedure Laterality Date  . ABDOMINAL HYSTERECTOMY     complete  . BREAST SURGERY     breast biopsy   . COLONOSCOPY  03/09/2011   [id number] diverticula, small internal hemorrhoids.   . ESOPHAGOGASTRODUODENOSCOPY   03/09/2011   [id number] in the distal esophagus/ Stenosis at the pylorus/ Mild gastritis/ Hiatal hernia. gastric bx showed gastritis but no H.pylori  . ESOPHAGOGASTRODUODENOSCOPY  03/20/11   SLF: stricture distal esophgus and stenosis at pyloris likely from NSAIDs. s/p dialtion.   . ESOPHAGOGASTRODUODENOSCOPY N/A 01/24/2017   Procedure: ESOPHAGOGASTRODUODENOSCOPY (EGD);  Surgeon: Danie Binder, MD;  [facility];  Service: Endoscopy;  Laterality: N/A;  10:45am  . ESOPHAGOGASTRODUODENOSCOPY N/A 02/08/2017   Procedure: ESOPHAGOGASTRODUODENOSCOPY (EGD);   Surgeon: Danie Binder, MD;  [facility];  Service: Endoscopy;  Laterality: N/A;  12:45pm  . ESOPHAGOGASTRODUODENOSCOPY N/A 04/12/2017   Procedure: ESOPHAGOGASTRODUODENOSCOPY (EGD);  Surgeon: Danie Binder, MD;  [facility];  Service: Endoscopy;  Laterality: N/A;  1:30pm  . ESOPHAGOGASTRODUODENOSCOPY (EGD) WITH ESOPHAGEAL DILATION  11/23/2011   SLF: Stricture was found at the gastroesophageal junction/ Small hiatal hernia/  Sessile polyp was found in the gastric body and gastric fundus/ Stenosis was found at the pylorus/ The duodenal mucosa showed no abnormalities in the bulb and second portion of the duodenum  . ESOPHAGOGASTRODUODENOSCOPY (EGD) WITH ESOPHAGEAL DILATION  12/03/2011   [id number] was found in the entire examined stomach/Stricture was found at the gastroesophageal junction  . LAPAROSCOPY  2000   [place]-stomach pain-pt reports negative  . ROTATOR CUFF REPAIR    . SAVORY DILATION N/A 01/24/2017   Procedure: SAVORY DILATION;  Surgeon: Danie Binder, MD;  [facility];  Service: Endoscopy;  Laterality: N/A;  . SAVORY DILATION N/A 02/08/2017   Procedure: SAVORY DILATION;  Surgeon: Danie Binder, MD;  [facility];  Service: Endoscopy;  Laterality: N/A;  . SAVORY DILATION N/A 04/12/2017  Procedure: SAVORY DILATION;  Surgeon: Danie Binder, MD;  [facility];  Service: Endoscopy;  Laterality: N/A;   Medications: Reviewed & Updated - see associated section                       Current Outpatient Medications:  .  acetaminophen (TYLENOL) 500 MG tablet, Take 500 mg by mouth every 6 (six) hours as needed., Disp: , Rfl:  .  albuterol (PROVENTIL HFA;VENTOLIN HFA) 108 (90 BASE) MCG/ACT inhaler, Inhale 2 puffs into the lungs every 6 (six) hours as needed for wheezing or shortness of breath. , Disp: , Rfl:  .  amitriptyline (ELAVIL) 10 MG tablet, Take 10 mg by mouth at bedtime. , Disp: , Rfl:  .  amLODipine (NORVASC) 10 MG tablet,  Take 10 mg by mouth daily., Disp: , Rfl:  .  aspirin EC 81 MG tablet, Take 81 mg by mouth daily., Disp: , Rfl:  .  atorvastatin (LIPITOR) 40 MG tablet, Take 40 mg by mouth every evening. , Disp: , Rfl: 5 .  benazepril-hydrochlorthiazide (LOTENSIN HCT) 20-25 MG tablet, Take 1 tablet by mouth daily. , Disp: , Rfl:  .  buPROPion (WELLBUTRIN XL) 150 MG 24 hr tablet, , Disp: , Rfl:  .  cholecalciferol (VITAMIN D) 1000 units tablet, Take 2,000 Units by mouth daily. , Disp: , Rfl:  .  citalopram (CELEXA) 40 MG tablet, Take 40 mg by mouth daily.  , Disp: , Rfl:  .  DEXILANT 60 MG capsule, TAKE 1 CAPSULE BY MOUTH EVERY DAY, Disp: 30 capsule, Rfl: 5 .  fluticasone (FLOVENT HFA) 110 MCG/ACT inhaler, Inhale 2 puffs into the lungs 2 (two) times daily as needed (for shortness of breath). , Disp: , Rfl:  .  gabapentin (NEURONTIN) 300 MG capsule, Take 300 mg by mouth at bedtime., Disp: , Rfl:  .  ibuprofen (ADVIL) 200 MG tablet, Take 200 mg by mouth every 6 (six) hours as needed., Disp: , Rfl:  .  levothyroxine (SYNTHROID, LEVOTHROID) 50 MCG tablet, Take 50 mcg by mouth daily before breakfast. , Disp: , Rfl:  .  LINZESS 145 MCG CAPS capsule, TAKE 1 CAPSULE BY MOUTH EVERY DAY (Patient taking differently: TAKE 1 CAPSULE BY MOUTH EVERY DAY AS NEEDED FOR CONSTIPATION), Disp: 90 capsule, Rfl: 3 .  LORazepam (ATIVAN) 1 MG tablet, Take 1 mg by mouth 2 (two) times daily as needed for anxiety. , Disp: , Rfl:  .  potassium chloride (KLOR-CON) 20 MEQ packet, Take 20 mEq by mouth once., Disp: , Rfl:  .  Vitamin D, Ergocalciferol, (DRISDOL) 1.25 MG (50000 UT) CAPS capsule, Take 1 capsule by mouth once a week., Disp: , Rfl:    Social History: Reviewed -  reports that she has never smoked. She has never used smokeless tobacco.  Objective Findings:  Vitals: There were no vitals taken for this visit.  PHYSICAL EXAMINATION General appearance - alert, well appearing, and in no distress Mental status - normal mood, behavior,  speech, dress, motor activity, and thought processes, affect appropriate to mood  PELVIC Pessary was removed without complication. Patient tolerated well.    Assessment & Plan:   A:  1.  Pessary removal;, I do not find any GYN source for the abdominal pain the possibility that the kidney is contributing to the pain is a possibility.  Patient is encouraged to reestablish urology connection and bring this up for discussion with primary care about possible referral  P:  1.  Do not believe that the patient's symptoms are GYN related. Advised follow-up with PCP and referral to urologist for further evaluation.  By signing my name below, I, Clerance Lav, attest that this documentation has been prepared under the direction and in the presence of Jonnie Kind, MD. Electronically Signed: [place]. 06/05/19. 10:31 AM.  I personally performed the services described in this documentation, which was SCRIBED in my presence. The recorded information has been reviewed and considered accurate. It has been edited as necessary during review. Jonnie Kind, MD

## 2019-06-08 DIAGNOSIS — Z4689 Encounter for fitting and adjustment of other specified devices: Secondary | ICD-10-CM | POA: Insufficient documentation

## 2019-08-04 ENCOUNTER — Encounter: Payer: Self-pay | Admitting: Urology

## 2019-08-04 ENCOUNTER — Ambulatory Visit (INDEPENDENT_AMBULATORY_CARE_PROVIDER_SITE_OTHER): Payer: Medicare HMO | Admitting: Urology

## 2019-08-04 ENCOUNTER — Other Ambulatory Visit: Payer: Self-pay

## 2019-08-04 VITALS — BP 134/71 | HR 76 | Temp 97.0°F | Ht 62.0 in | Wt 142.0 lb

## 2019-08-04 DIAGNOSIS — Q632 Ectopic kidney: Secondary | ICD-10-CM

## 2019-08-04 DIAGNOSIS — R1030 Lower abdominal pain, unspecified: Secondary | ICD-10-CM | POA: Diagnosis not present

## 2019-08-04 DIAGNOSIS — R300 Dysuria: Secondary | ICD-10-CM | POA: Diagnosis not present

## 2019-08-04 LAB — POCT URINALYSIS DIPSTICK
Blood, UA: NEGATIVE
Glucose, UA: NEGATIVE
Ketones, UA: NEGATIVE
Leukocytes, UA: NEGATIVE
Nitrite, UA: NEGATIVE
Protein, UA: NEGATIVE
Spec Grav, UA: 1.01 (ref 1.010–1.025)
Urobilinogen, UA: 1 E.U./dL
pH, UA: 8.5 — AB (ref 5.0–8.0)

## 2019-08-04 NOTE — Consult Note (Signed)
H&P  Chief Complaint: Abdominal pain, pelvic kidney  History of Present Illness: Kelly Hebert is a 79 y.o. year old female [facility] abdominal pain.  She has been seen also by Dr. Mallory Hebert from a gynecologic standpoint.  She has had a pessary for pelvic prolapse.  Apparently, this was to try to relieve her lower abdominal pain.  It was felt that this did not help her abdominal pain, and perhaps the pelvic kidney was causing this pain.  The patient is 79 years of age, and this pain has not been a lifelong issue.  Her pain is in the lower abdomen bilaterally.  Pain is sharp typically.  It is made worse by moving around, it is made better by taking Tylenol or ibuprofen.  She denies any significant change in lower urinary tract symptoms.  It does not get better with voiding, but sometimes gets better with having a bowel movement.  Past Medical History:  Diagnosis Date  . Acid reflux   . Anxiety   . Cancer [facility])    endometrial cancer  . Constipation   . COPD (chronic obstructive pulmonary disease) ([place])   . Depression   . Gout   . Heart murmur   . Hx of pyloric stenosis 11/14/2011  . Hypercholesteremia   . Hyperglycemia   . Hypertension   . Hypothyroidism   . Renal disorder    pelvic kidney  . S/P colonoscopy 2003   Dr. Nicolasa Hebert: normal colon, normal path.   . Vitamin D deficiency disease     Past Surgical History:  Procedure Laterality Date  . ABDOMINAL HYSTERECTOMY     complete  . BREAST SURGERY     breast biopsy   . COLONOSCOPY  03/09/2011   [id number] diverticula, small internal hemorrhoids.   . ESOPHAGOGASTRODUODENOSCOPY   03/09/2011   [id number] in the distal esophagus/ Stenosis at the pylorus/ Mild gastritis/ Hiatal hernia. gastric bx showed gastritis but no H.pylori  . ESOPHAGOGASTRODUODENOSCOPY  03/20/11   SLF: stricture distal esophgus and stenosis at pyloris likely from NSAIDs. s/p dialtion.   . ESOPHAGOGASTRODUODENOSCOPY N/A 01/24/2017    Procedure: ESOPHAGOGASTRODUODENOSCOPY (EGD);  Surgeon: Kelly Binder, MD;  [facility];  Service: Endoscopy;  Laterality: N/A;  10:45am  . ESOPHAGOGASTRODUODENOSCOPY N/A 02/08/2017   Procedure: ESOPHAGOGASTRODUODENOSCOPY (EGD);  Surgeon: Kelly Binder, MD;  [facility];  Service: Endoscopy;  Laterality: N/A;  12:45pm  . ESOPHAGOGASTRODUODENOSCOPY N/A 04/12/2017   Procedure: ESOPHAGOGASTRODUODENOSCOPY (EGD);  Surgeon: Kelly Binder, MD;  [facility];  Service: Endoscopy;  Laterality: N/A;  1:30pm  . ESOPHAGOGASTRODUODENOSCOPY (EGD) WITH ESOPHAGEAL DILATION  11/23/2011   SLF: Stricture was found at the gastroesophageal junction/ Small hiatal hernia/  Sessile polyp was found in the gastric body and gastric fundus/ Stenosis was found at the pylorus/ The duodenal mucosa showed no abnormalities in the bulb and second portion of the duodenum  . ESOPHAGOGASTRODUODENOSCOPY (EGD) WITH ESOPHAGEAL DILATION  12/03/2011   [id number] was found in the entire examined stomach/Stricture was found at the gastroesophageal junction  . LAPAROSCOPY  2000   [place]-stomach pain-pt reports negative  . ROTATOR CUFF REPAIR    . SAVORY DILATION N/A 01/24/2017   Procedure: SAVORY DILATION;  Surgeon: Kelly Binder, MD;  [facility];  Service: Endoscopy;  Laterality: N/A;  . SAVORY DILATION N/A 02/08/2017   Procedure: SAVORY DILATION;  Surgeon: Kelly Binder, MD;  [facility];  Service: Endoscopy;  Laterality:  N/A;  . SAVORY DILATION N/A 04/12/2017   Procedure: SAVORY DILATION;  Surgeon: Kelly Binder, MD;  [facility];  Service: Endoscopy;  Laterality: N/A;    Home Medications:  (Not in a hospital admission)   Allergies:  Allergies  Allergen Reactions  . Ivp Dye [Iodinated Diagnostic Agents] Hives, Itching and Other (See Comments)    Pt. States this happened 4 or 5 years ago in [place].    . Nifedipine Nausea And Vomiting and Other  (See Comments)    unknown    Family History  Problem Relation Age of Onset  . Kidney disease Sister   . Cirrhosis Brother        ?etoh  . Diabetes Brother   . Heart attack Brother   . Heart disease Mother   . Heart attack Mother   . Heart disease Father   . Heart attack Father   . Diabetes Brother   . Cirrhosis Sister        Non-etoh  . Colon cancer Neg Hx     Social History:  reports that she has never smoked. She has never used smokeless tobacco. She reports that she does not drink alcohol and does not use drugs.  ROS: A complete review of systems was performed.  All systems are negative except for pertinent findings as noted.  Physical Exam:  Vital signs in last 24 hours: @VSRANGES @ General:  Alert and oriented, No acute distress HEENT: Normocephalic, atraumatic Neck: No JVD or lymphadenopathy Cardiovascular: Regular rate  Lungs: Normal inspiratory/expiratory excursion Abdomen: Soft, nontender, nondistended, no abdominal masses.  Mild bilateral lower quadrant tenderness without rebound or guarding.  No masses. Back: No CVA tenderness Extremities: No edema Neurologic: Grossly intact   I have reviewed prior pt notes  I have reviewed notes from referring/previous physicians  I have reviewed urinalysis results  I have independently reviewed prior imaging  Radiologic Imaging: I reviewed the patient's pelvic ultrasound.  She does have a pelvic kidney that is without hydronephrosis.  Impression/Assessment:  Bilateral lower abdominal pain.  I do not think it is of GU etiology.  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  Perhaps this is of GI etiology.  Plan:  I have reassured the patient that I do not think her pelvic kidney is causing any symptoms  I do recommend that she increase fiber in her diet  I recommend that she follow-up with a gastroenterologist  She will return as needed  Kelly Hebert 08/04/2019, 8:48 AM  Kelly Boxer. Chinedu Agustin MD

## 2019-08-04 NOTE — Progress Notes (Signed)
Urological Symptom Review  Patient is experiencing the following symptoms: Hard to postpone urination Burning/pain with urination Get up at night to urinate Leakage of urine Stream starts and stops Injury to kidneys/bladder Weak stream   Review of Systems  Gastrointestinal (upper)  : Nausea Indigestion/heartburn  Gastrointestinal (lower) : Constipation  Constitutional : Night Sweats Weight loss Fatigue  Skin: Itching  Eyes: Blurred vision Double vision  Ear/Nose/Throat : Sinus problems  Hematologic/Lymphatic: Swollen glands  Cardiovascular : Negative for cardiovascular symptoms  Respiratory : Cough  Endocrine: Excessive thirst  Musculoskeletal: Joint pain  Neurological: Headaches Dizziness  Psychologic: Depression Anxiety

## 2020-02-17 ENCOUNTER — Other Ambulatory Visit: Payer: Self-pay | Admitting: Gastroenterology

## 2020-02-18 ENCOUNTER — Other Ambulatory Visit: Payer: Self-pay | Admitting: Gastroenterology

## 2020-04-20 ENCOUNTER — Ambulatory Visit (HOSPITAL_COMMUNITY): Payer: Medicare HMO | Attending: Obstetrics and Gynecology | Admitting: Physical Therapy

## 2020-06-28 ENCOUNTER — Ambulatory Visit (HOSPITAL_COMMUNITY): Payer: Medicare Other | Attending: Obstetrics and Gynecology | Admitting: Physical Therapy

## 2020-06-28 ENCOUNTER — Telehealth (HOSPITAL_COMMUNITY): Payer: Self-pay | Admitting: Physical Therapy

## 2020-06-28 ENCOUNTER — Other Ambulatory Visit: Payer: Self-pay

## 2020-06-28 DIAGNOSIS — M6281 Muscle weakness (generalized): Secondary | ICD-10-CM | POA: Insufficient documentation

## 2020-06-28 DIAGNOSIS — R102 Pelvic and perineal pain: Secondary | ICD-10-CM | POA: Diagnosis present

## 2020-06-28 NOTE — Therapy (Signed)
[place], [place], [address] Phone: [phone]   Fax:  [phone]  Physical Therapy Evaluation  Patient Details  Name: Kelly Hebert MRN: [id number] Date of Birth: April 13, 1940 Referring Provider (PT): Carmel Sacramento   Encounter Date: 06/28/2020   PT End of Session - 06/28/20 1013    Visit Number 1    Number of Visits 12    Date for PT Re-Evaluation 08/09/20    Authorization Type UHC medicare/medicaid    PT Start Time 0920    PT Stop Time 1000    PT Time Calculation (min) 40 min           Past Medical History:  Diagnosis Date  . Acid reflux   . Anxiety   . Arthritis   . Asthma   . Cancer [facility])    endometrial cancer  . Constipation   . COPD (chronic obstructive pulmonary disease) ([place])   . Depression   . Gout   . Heart murmur   . Hx of pyloric stenosis 11/14/2011  . Hypercholesteremia   . Hyperglycemia   . Hypertension   . Hypothyroidism   . Renal disorder    pelvic kidney  . Retinitis pigmentosa   . S/P colonoscopy 2003   Dr. Nicolasa Ducking: normal colon, normal path.   . Stomach ulcer   . Vitamin D deficiency disease     Past Surgical History:  Procedure Laterality Date  . ABDOMINAL HYSTERECTOMY     complete  . BREAST SURGERY     breast biopsy   . COLONOSCOPY  03/09/2011   SAY:TKZSWFU diverticula, small internal hemorrhoids.   . ESOPHAGOGASTRODUODENOSCOPY   03/09/2011   XNA:[id number] in the distal esophagus/ Stenosis at the pylorus/ Mild gastritis/ Hiatal hernia. gastric bx showed gastritis but no H.pylori  . ESOPHAGOGASTRODUODENOSCOPY  03/20/11   SLF: stricture distal esophgus and stenosis at pyloris likely from NSAIDs. s/p dialtion.   . ESOPHAGOGASTRODUODENOSCOPY N/A 01/24/2017   Procedure: ESOPHAGOGASTRODUODENOSCOPY (EGD);  Surgeon: Danie Binder, MD;  [facility];  Service: Endoscopy;  Laterality: N/A;  10:45am  . ESOPHAGOGASTRODUODENOSCOPY N/A 02/08/2017   Procedure:  ESOPHAGOGASTRODUODENOSCOPY (EGD);  Surgeon: Danie Binder, MD;  [facility];  Service: Endoscopy;  Laterality: N/A;  12:45pm  . ESOPHAGOGASTRODUODENOSCOPY N/A 04/12/2017   Procedure: ESOPHAGOGASTRODUODENOSCOPY (EGD);  Surgeon: Danie Binder, MD;  [facility];  Service: Endoscopy;  Laterality: N/A;  1:30pm  . ESOPHAGOGASTRODUODENOSCOPY (EGD) WITH ESOPHAGEAL DILATION  11/23/2011   SLF: Stricture was found at the gastroesophageal junction/ Small hiatal hernia/  Sessile polyp was found in the gastric body and gastric fundus/ Stenosis was found at the pylorus/ The duodenal mucosa showed no abnormalities in the bulb and second portion of the duodenum  . ESOPHAGOGASTRODUODENOSCOPY (EGD) WITH ESOPHAGEAL DILATION  12/03/2011   RKY:HCWCB was found in the entire examined stomach/Stricture was found at the gastroesophageal junction  . LAPAROSCOPY  2000   [place]-stomach pain-pt reports negative  . ROTATOR CUFF REPAIR    . SAVORY DILATION N/A 01/24/2017   Procedure: SAVORY DILATION;  Surgeon: Danie Binder, MD;  [facility];  Service: Endoscopy;  Laterality: N/A;  . SAVORY DILATION N/A 02/08/2017   Procedure: SAVORY DILATION;  Surgeon: Danie Binder, MD;  [facility];  Service: Endoscopy;  Laterality: N/A;  . SAVORY DILATION N/A 04/12/2017   Procedure: SAVORY DILATION;  Surgeon: Danie Binder, MD;  [facility];  Service: Endoscopy;  Laterality: N/A;    There were no vitals filed for this visit.    Subjective Assessment - 06/28/20 0923    Subjective Kelly Hebert states that she has been having pain at the bottom of her stomach and the pain goes all the way around to her back and into both of her legs.  The MD thought that the pain might be coming from scar tissue from her hysterectomy.  She was sent to an MD in [facility] who referred her to this clinic.  Her pain is chronic from 2009, the pain is getting worse, the pain is constant.  The  more active she is the more pain she has.    Pertinent History hysterectomy    How long can you sit comfortably? sitting is ok unless she is sitting more than 2 hours she will start having increased pain.    How long can you stand comfortably? She will have increased pain after 10-15 minutes.    How long can you walk comfortably? Pain begins after 10-15 minutes    Diagnostic tests ultarsound,    Patient Stated Goals less pain    Currently in Pain? Yes    Pain Score 8    lowest 0; highest 10 rarely out of pain   Pain Location Pelvis    Pain Orientation Anterior    Pain Descriptors / Indicators Tightness    Pain Type Chronic pain    Pain Onset More than a month ago    Pain Frequency Intermittent    Aggravating Factors  activity.    Pain Relieving Factors nothing    Effect of Pain on Daily Activities limits housekeeping .              [facility] PT Assessment - 06/28/20 0001      Assessment   Medical Diagnosis pelvic pain    Referring Provider (PT) Carmel Sacramento    Onset Date/Surgical Date --   chronic 2009   Prior Therapy yes in [place]      Precautions   Precautions None      Balance Screen   Has the patient fallen in the past 6 months No    Has the patient had a decrease in activity level because of a fear of falling?  Yes    Is the patient reluctant to leave their home because of a fear of falling?  No      ROM / Strength   AROM / PROM / Strength --   weak lower abdominal mm 2+/5                     Objective measurements completed on examination: See above findings.       [place] Adult PT Treatment/Exercise - 06/28/20 0001      Exercises   Exercises Lumbar      Lumbar Exercises: Supine   Ab Set 10 reps    Other Supine Lumbar Exercises marble roll x 10      Modalities   Modalities Ultrasound      Ultrasound   Ultrasound Location Laser at 60J/cm2 for pain and stivfness to pelvic area 4 treatments at 1:24/treatment.                  PT  Education - 06/28/20 1012    Education Details initial pelvic strengthening exercises    Person(s) Educated Patient    Methods Explanation;Verbal cues;Handout;Tactile cues    Comprehension Verbalized understanding;Returned demonstration  PT Short Term Goals - 06/28/20 1027      PT SHORT TERM GOAL #1   Title Pt to be I in HEP to improve pelvic mm strength.    Time 3    Period Weeks    Status New    Target Date 07/19/20      PT SHORT TERM GOAL #2   Title Pt to state that her pain is decreased to no greater than a 6/10.    Time 3    Period Weeks    Status New      PT SHORT TERM GOAL #3   Title Pt to state that her pelvic pain frequency has decreased by 30%    Time 3    Period Weeks    Status New             PT Long Term Goals - 06/28/20 1029      PT LONG TERM GOAL #1   Title Pt to be I in an advanced HEP to improve pelvic strength.    Time 6    Period Weeks    Status New    Target Date 08/09/20      PT LONG TERM GOAL #2   Title PT pain to be no greater than a 4/10 throughout the day to allow pt to be able to complete her housework without taking breaks.    Time 6    Period Weeks    Status New      PT LONG TERM GOAL #3   Title PT to state that the frequency of her pain has decreased by 50%    Time 6    Period Weeks    Status New                  Plan - 06/28/20 1015    Clinical Impression Statement Kelly Hebert is an 80 yo female who has had pelvic pain since 2009.  The pt states that this was approximately two years following her hysterectomy.  She has had therapy at the [facility] in [place] which helped for awhile.  She is almost always in pain and has to take breaks in her day to day activity due to the pain that she is experiencing.  The therapist explained that she does not complete internal work and would mainly work on external myofascial relase, pelvic strengthening exercises and using a laser to decrease her pain.  The pt  prefers to be transfered to the [place] clinic at this time. Kelly Hebert will benefit from skilled PT for therapeutic exercises to improve pelvic strength and posture, manual to decrease adhesions and pain.    Personal Factors and Comorbidities Age;Comorbidity 2;Time since onset of injury/illness/exacerbation    Comorbidities hysterectomy, constipation    Examination-Activity Limitations Locomotion Level;Other    Examination-Participation Restrictions Cleaning;Yard Work;Shop;Community Activity    Stability/Clinical Decision Making Evolving/Moderate complexity    Clinical Decision Making Moderate    Rehab Potential Good    PT Frequency 2x / week    PT Duration 6 weeks    PT Treatment/Interventions ADLs/Self Care Home Management;Therapeutic activities;Therapeutic exercise;Patient/family education;Manual techniques;Scar mobilization;Other (comment)   laser   PT Next Visit Plan Continue with manual to decrease adhesion, pelvic and postural strengthening exercises.    PT Home Exercise Plan transverse abdominal set, rolling marble from belly button to pelvic bone and back 10 x each x 3x/day.           Patient will benefit from skilled  therapeutic intervention in order to improve the following deficits and impairments:  Pain,Increased fascial restricitons,Decreased strength,Postural dysfunction  Visit Diagnosis: Pelvic pain  Muscle weakness (generalized)     Problem List Patient Active Problem List   Diagnosis Date Noted  . Encounter for fitting and adjustment of pessary 06/08/2019  . Hoarseness or changing voice 05/29/2017  . Early satiety   . Dyspepsia 05/17/2016  . Constipation 04/17/2012  . Dysuria 11/14/2011  . Dysphagia 02/19/2011  . Asthma 01/07/2011  . Benign hypertension 01/07/2011  . Hyperlipidemia 01/07/2011  . Anxiety 01/07/2011  . Hypothyroidism 01/07/2011  . GERD (gastroesophageal reflux disease) 01/07/2011  Rayetta Humphrey, PT CLT [phone] 06/28/2020, 10:35  AM  [place], [place], [address] Phone: [phone]   Fax:  [phone]  Name: Kelly Hebert MRN: [id number] Date of Birth: 24-Apr-1940

## 2020-06-28 NOTE — Telephone Encounter (Signed)
Requested a return phone call to transfer this patient to their PT Pelvic therapist. Jenny Reichmann did the evaluation today and pt agrees to go to [facility] for tx.

## 2020-06-28 NOTE — Telephone Encounter (Signed)
Melissa from [facility] called back she will call Luciano Cutter to schedule for pelvic therapy.

## 2020-07-06 ENCOUNTER — Other Ambulatory Visit: Payer: Self-pay

## 2020-07-06 ENCOUNTER — Ambulatory Visit: Payer: Medicare Other | Attending: Obstetrics and Gynecology

## 2020-07-06 DIAGNOSIS — R278 Other lack of coordination: Secondary | ICD-10-CM | POA: Diagnosis not present

## 2020-07-06 DIAGNOSIS — N3946 Mixed incontinence: Secondary | ICD-10-CM | POA: Insufficient documentation

## 2020-07-06 DIAGNOSIS — M6281 Muscle weakness (generalized): Secondary | ICD-10-CM

## 2020-07-06 DIAGNOSIS — R102 Pelvic and perineal pain: Secondary | ICD-10-CM | POA: Insufficient documentation

## 2020-07-06 NOTE — Patient Instructions (Signed)
1) Spine extension lying down: bend knees to support back. Bend elbow and keep them at your side while arms are by your head. Hold for 30 sec. X3 reps, twice a day.

## 2020-07-06 NOTE — Therapy (Signed)
[place] [facility] SERVICES [address] [place], [place], [address] Phone: [phone]   Fax:  [phone]  Physical Therapy Treatment  Patient Details  Name: Kelly Hebert MRN: [id number] Date of Birth: December 01, 1940 Referring Provider (PT): Carmel Sacramento   Encounter Date: 07/06/2020   PT End of Session - 07/06/20 1112     Visit Number 2    Number of Visits 12    Date for PT Re-Evaluation 10/04/20    Authorization Type UHC medicare/medicaid    PT Start Time 1000    PT Stop Time 1102    PT Time Calculation (min) 62 min    Activity Tolerance Patient tolerated treatment well;No increased pain    Behavior During Therapy [facility] for tasks assessed/performed             Past Medical History:  Diagnosis Date   Acid reflux    Anxiety    Arthritis    Asthma    Cancer ([place])    endometrial cancer   Constipation    COPD (chronic obstructive pulmonary disease) ([place])    Depression    Gout    Heart murmur    Hx of pyloric stenosis 11/14/2011   Hypercholesteremia    Hyperglycemia    Hypertension    Hypothyroidism    Renal disorder    pelvic kidney   Retinitis pigmentosa    S/P colonoscopy 2003   Dr. Nicolasa Ducking: normal colon, normal path.    Stomach ulcer    Vitamin D deficiency disease     Past Surgical History:  Procedure Laterality Date   ABDOMINAL HYSTERECTOMY     complete   BREAST SURGERY     breast biopsy    COLONOSCOPY  03/09/2011   RFF:MBWGYKZ diverticula, small internal hemorrhoids.    ESOPHAGOGASTRODUODENOSCOPY   03/09/2011   LDJ:[id number] in the distal esophagus/ Stenosis at the pylorus/ Mild gastritis/ Hiatal hernia. gastric bx showed gastritis but no H.pylori   ESOPHAGOGASTRODUODENOSCOPY  03/20/11   SLF: stricture distal esophgus and stenosis at pyloris likely from NSAIDs. s/p dialtion.    ESOPHAGOGASTRODUODENOSCOPY N/A 01/24/2017   Procedure: ESOPHAGOGASTRODUODENOSCOPY (EGD);  Surgeon: Danie Binder, MD;  [facility];  Service: Endoscopy;  Laterality: N/A;  10:45am   ESOPHAGOGASTRODUODENOSCOPY N/A 02/08/2017   Procedure: ESOPHAGOGASTRODUODENOSCOPY (EGD);  Surgeon: Danie Binder, MD;  [facility];  Service: Endoscopy;  Laterality: N/A;  12:45pm   ESOPHAGOGASTRODUODENOSCOPY N/A 04/12/2017   Procedure: ESOPHAGOGASTRODUODENOSCOPY (EGD);  Surgeon: Danie Binder, MD;  [facility];  Service: Endoscopy;  Laterality: N/A;  1:30pm   ESOPHAGOGASTRODUODENOSCOPY (EGD) WITH ESOPHAGEAL DILATION  11/23/2011   SLF: Stricture was found at the gastroesophageal junction/ Small hiatal hernia/  Sessile polyp was found in the gastric body and gastric fundus/ Stenosis was found at the pylorus/ The duodenal mucosa showed no abnormalities in the bulb and second portion of the duodenum   ESOPHAGOGASTRODUODENOSCOPY (EGD) WITH ESOPHAGEAL DILATION  12/03/2011   QZE:SPQZR was found in the entire examined stomach/Stricture was found at the gastroesophageal junction   LAPAROSCOPY  2000   [place]-stomach pain-pt reports negative   ROTATOR CUFF REPAIR     SAVORY DILATION N/A 01/24/2017   Procedure: SAVORY DILATION;  Surgeon: Danie Binder, MD;  [facility];  Service: Endoscopy;  Laterality: N/A;   SAVORY DILATION N/A 02/08/2017   Procedure: SAVORY DILATION;  Surgeon: Danie Binder, MD;  [facility];  Service: Endoscopy;  Laterality: N/A;  SAVORY DILATION N/A 04/12/2017   Procedure: SAVORY DILATION;  Surgeon: Danie Binder, MD;  [facility];  Service: Endoscopy;  Laterality: N/A;    There were no vitals filed for this visit.   Subjective Assessment - 07/06/20 1004     Subjective Pt denied any changes since last visit. Pt reported the exercises (pelvic floor contractions) incr. pull and pain in back and hips/pelvis.  Urinary: leakage while coughing, sneezing, laughing, lifting and urge incontinence (less often ). Pt goes through approx. 5 pads per day. Bowel: no  leakage. Pt takes a pill every other day to have bowel movement, pt has a bowel movement daily to every other day. Sexual function: not currently sexually active but has pain with speculum insertion. Core stability: hx of back pain that wraps around to hips/groin radiating to BLEs (ant and post). Pt has hx of hysterectomy with abdominal scar, pt reported scar healed well.    Pertinent History hysterectomy    Patient Stated Goals less pain, "do my housework and other thing"    Currently in Pain? Yes    Pain Score 5     Pain Location Hip    Pain Orientation Left;Right    Pain Descriptors / Indicators Aching;Tightness    Pain Type Chronic pain    Pain Onset More than a month ago    Pain Frequency Constant   intensity fluctuates   Aggravating Factors  activity    Pain Relieving Factors repositioning                  Pelvic Floor Physical Therapy Evaluation and Assessment  Screenings: Red Flags: no Have you had any night sweats? Yes, after menopause Unexplained weight loss? no Saddle anesthesia? no Unexplained changes in bowel or bladder changes? no     NMR:  OBJECTIVE  Posture/Observations:  Sitting:rounded shoulders, FHP, LEs not crossed Standing: rounded shoulders. [facility]   Palpation/Segmental Motion/Joint Play: Tx spine hypomobility. TTP over R piriformis, R ischial tub., R QL. Pt also reported R shoulder pain/pulling 2/2 hx of R rotator cuff repair many years ago. Pt limited in Lumbar spine ext (with lightheadedness reported), and B sidebending, B rotation (tx spine).    Range of Motion/Flexibilty:  Spine: Lx ext and B sidebending limited. Pt reported lightheadedness looking up with Lx ext and states this happens during sit to stand. Will assess orthostatics next session Hips: B hip ext/flex limited 2/2 decr. Strength. Will formally assess next visit (ROM wise)   Strength/MMT:  LE MMT  LE MMT Left Right  Hip flex:  (L2) 2+/5 2+/5  Hip ext: 2/5 2/5  Hip abd:  2+/5 2+/5  Hip add: /5 /5  Hip IR /5 /5  Hip ER /5 /5    B knee flex: 3+/5, knee ext: 4/5, ankle DF: 3+/5    Pelvic Floor External Exam: Introitus Appears:  Skin integrity:  Palpation: Cough: Prolapse visible?: Scar mobility: Through clothing: Ischial tuberosities: TTP over R medial IT Palpation for pelvic floor contraction: difficulty isolating PF musculature without engaging LEs. Coccyx: no pain noted   Gait Analysis: decr. Stride length, decr. B DF, decr. Trunk rotation.     INTERVENTIONS THIS SESSION:   HEP 1) Spine extension lying down: bend knees to support back. Bend elbow and keep them at your side while arms are by your head. Hold for 30 sec. X3 reps, twice a day.   Pt required extensive cues and demo to perform correctly, pt unable to perform supine angels  at this time 2/2 R shoulder limitation 2/2 hx of R rotator cuff repair years ago.                 SELF CARE:  PT Education - 07/06/20 1109     Education Details PT educated pt on exam finding, new PT POC, frequency and duration. PT had pt cease current pelvic floor HEP 2/2 incr. of pulling and back/hip pain mostly likely 2/2 pelvic alignment impairments (L iliac crest elevated in stance and LLE 0.5" in supine). PT educated pt on the importance of wearing proper shoes with strap or back around heel. Pt was told by podiatrist to wear slip on shoes 2/2 heel issues? However, pt stated if she doesn't wear supportive shoes she notice incr. in back and foot pain after approx. 10 minutes of amb. PT provided pt with thoracic spine ext. static hold vs. angel 2/2 R rotator cuff (pt with hx of surgery years ago-unsure of what year).    Person(s) Educated Patient    Methods Explanation;Demonstration;Tactile cues;Verbal cues;Handout    Comprehension Returned demonstration;Verbalized understanding;Need further instruction              PT Short Term Goals - 07/06/20 1124       PT SHORT TERM GOAL #1    Title Pt to be I in initial HEP to improve pelvic mm strength. TARGET DATE FOR ALL STGS: 7/6/2    Time 4    Period Weeks    Status New    Target Date 07/19/20      PT SHORT TERM GOAL #2   Title Pt to state that her pain is decreased to no greater than a 6/10.    Time 4    Period Weeks    Status New      PT SHORT TERM GOAL #3   Title Pt to state that her pelvic pain frequency has decreased by 30%    Time 4    Period Weeks    Status New      PT SHORT TERM GOAL #4   Title Pt will report she uses </=3 pads per day 2/2 urinary leakage.    Baseline 5 pads/day    Time 4    Period Weeks    Status New               PT Long Term Goals - 07/06/20 1125       PT LONG TERM GOAL #1   Title Pt to be I in an advanced HEP to improve pelvic strength. TARGET DATE FOR ALL LTGS: 08/31/20    Time 10    Period Weeks    Status New      PT LONG TERM GOAL #2   Title PT pain to be no greater than a 4/10 throughout the day to allow pt to be able to complete her housework without taking breaks.    Time 10    Period Weeks    Status New      PT LONG TERM GOAL #3   Title PT to state that the frequency of her pain has decreased by 50%    Time 10    Period Weeks    Status New      PT LONG TERM GOAL #4   Title Pt will report using </=1 pad/day 2/2 urinary leakage.    Baseline 5 pads/day    Time 10    Period Weeks    Status New  PT LONG TERM GOAL #5   Title Pt will demonstrate pelvic alignment over the last 3 weeks to improve ability to contract pelvic floor and decr. pain.    Time 10    Period Weeks    Status New                   Plan - 07/06/20 1114     Clinical Impression Statement Skilled session focused on exam of pt's pelvic alignment, spinal mobility, strengthen of LE/hips and palpation of hip/back musculature 2/2 pt's c/o pain. The following impairments were noted upon exam: gait deviations, postural dysfunction, decr. strength, TTP along R medial ischial  tuberosity, quad. lumb. and R piriformis, Tx spine hypomobililty and pain. Pt's L iliac crest elevated in stance and LLE 0.5" longer than RLE in supine, with difference decreasing to 0.25" after manual therapy to R QL and R piriformis. Pt unable to isolate pelvic floor contraction in supine or sidelying, therefore PT had pt cease pelvic floor contraction HEP and will add back on once alignment improves and pt able to properly perform. Pt would continue to benefit from skilled PT to improve pain and incontinence during all ADLs.    Personal Factors and Comorbidities Age;Comorbidity 2;Time since onset of injury/illness/exacerbation    Comorbidities hysterectomy, constipation    Examination-Activity Limitations Locomotion Level;Other    Examination-Participation Restrictions Cleaning;Yard Work;Shop;Community Activity    Stability/Clinical Decision Making Evolving/Moderate complexity    Rehab Potential Good    PT Frequency 1x / week    PT Duration Other (comment)   10 weeks   PT Treatment/Interventions ADLs/Self Care Home Management;Therapeutic activities;Therapeutic exercise;Patient/family education;Manual techniques;Scar mobilization;Other (comment);Biofeedback;Neuromuscular re-education;Dry needling;Spinal Manipulations;Joint Manipulations;Gait training;Stair training;Functional mobility training;DME Instruction   laser   PT Next Visit Plan Assess for orthostatic hypotension at beginning of session. Assess for DR and diaphragm. Continue with manual to decrease adhesion, pelvic and postural strengthening exercises. Review HEP, try open book, piriformis stretch. Sacrum PA with PF contraction.    PT Home Exercise Plan Provided Tx spine ext. in supine with elbows flexed. Ceased: transverse abdominal set, rolling marble from belly button to pelvic bone and back 10 x each x 3x/day.    Consulted and Agree with Plan of Care Patient             Patient will benefit from skilled therapeutic intervention in  order to improve the following deficits and impairments:  Pain, Increased fascial restricitons, Decreased strength, Postural dysfunction, Decreased coordination, Abnormal gait, Decreased balance, Decreased endurance, Hypomobility, Impaired flexibility, Impaired UE functional use  Visit Diagnosis: Other lack of coordination - Plan: PT plan of care cert/re-cert  Mixed incontinence - Plan: PT plan of care cert/re-cert  Pelvic pain - Plan: PT plan of care cert/re-cert  Muscle weakness (generalized) - Plan: PT plan of care cert/re-cert     Problem List Patient Active Problem List   Diagnosis Date Noted   Encounter for fitting and adjustment of pessary 06/08/2019   Hoarseness or changing voice 05/29/2017   Early satiety    Dyspepsia 05/17/2016   Constipation 04/17/2012   Dysuria 11/14/2011   Dysphagia 02/19/2011   Asthma 01/07/2011   Benign hypertension 01/07/2011   Hyperlipidemia 01/07/2011   Anxiety 01/07/2011   Hypothyroidism 01/07/2011   GERD (gastroesophageal reflux disease) 01/07/2011    Shaynah Hund L 07/06/2020, 11:30 AM  [place] [address] [place], [place], [address] Phone: [phone]   Fax:  [phone]  Name: Hanifa Antonetti  [place] MRN: [id number] Date of Birth: Jun 17, 1940   Geoffry Paradise, PT,DPT 07/06/20 11:35 AM Phone: [phone] Fax: [phone]

## 2020-07-13 ENCOUNTER — Other Ambulatory Visit: Payer: Self-pay

## 2020-07-13 ENCOUNTER — Ambulatory Visit: Payer: Medicare Other

## 2020-07-13 DIAGNOSIS — M6281 Muscle weakness (generalized): Secondary | ICD-10-CM

## 2020-07-13 DIAGNOSIS — R278 Other lack of coordination: Secondary | ICD-10-CM

## 2020-07-13 DIAGNOSIS — N3946 Mixed incontinence: Secondary | ICD-10-CM

## 2020-07-13 DIAGNOSIS — R102 Pelvic and perineal pain: Secondary | ICD-10-CM

## 2020-07-13 NOTE — Therapy (Signed)
[place] MAIN [facility] SERVICES [address] [place], [place], [address] Phone: [phone]   Fax:  [phone]  Physical Therapy Treatment  Patient Details  Name: Kelly Hebert MRN: [id number] Date of Birth: 1940-10-18 Referring Provider (PT): Carmel Sacramento   Encounter Date: 07/13/2020   PT End of Session - 07/13/20 1115     Visit Number 3    Number of Visits 12    Date for PT Re-Evaluation 10/04/20    Authorization Type UHC medicare/medicaid    Progress Note Due on Visit 10    PT Start Time 1008    PT Stop Time 1109    PT Time Calculation (min) 61 min    Activity Tolerance Patient tolerated treatment well    Behavior During Therapy [facility] for tasks assessed/performed             Past Medical History:  Diagnosis Date   Acid reflux    Anxiety    Arthritis    Asthma    Cancer ([place])    endometrial cancer   Constipation    COPD (chronic obstructive pulmonary disease) ([place])    Depression    Gout    Heart murmur    Hx of pyloric stenosis 11/14/2011   Hypercholesteremia    Hyperglycemia    Hypertension    Hypothyroidism    Renal disorder    pelvic kidney   Retinitis pigmentosa    S/P colonoscopy 2003   Dr. Nicolasa Ducking: normal colon, normal path.    Stomach ulcer    Vitamin D deficiency disease     Past Surgical History:  Procedure Laterality Date   ABDOMINAL HYSTERECTOMY     complete   BREAST SURGERY     breast biopsy    COLONOSCOPY  03/09/2011   LYY:TKPTWSF diverticula, small internal hemorrhoids.    ESOPHAGOGASTRODUODENOSCOPY   03/09/2011   KCL:EXNTZGYFV in the distal esophagus/ Stenosis at the pylorus/ Mild gastritis/ Hiatal hernia. gastric bx showed gastritis but no H.pylori   ESOPHAGOGASTRODUODENOSCOPY  03/20/11   SLF: stricture distal esophgus and stenosis at pyloris likely from NSAIDs. s/p dialtion.    ESOPHAGOGASTRODUODENOSCOPY N/A 01/24/2017   Procedure: ESOPHAGOGASTRODUODENOSCOPY (EGD);  Surgeon: Danie Binder,  MD;  [facility];  Service: Endoscopy;  Laterality: N/A;  10:45am   ESOPHAGOGASTRODUODENOSCOPY N/A 02/08/2017   Procedure: ESOPHAGOGASTRODUODENOSCOPY (EGD);  Surgeon: Danie Binder, MD;  [facility];  Service: Endoscopy;  Laterality: N/A;  12:45pm   ESOPHAGOGASTRODUODENOSCOPY N/A 04/12/2017   Procedure: ESOPHAGOGASTRODUODENOSCOPY (EGD);  Surgeon: Danie Binder, MD;  [facility];  Service: Endoscopy;  Laterality: N/A;  1:30pm   ESOPHAGOGASTRODUODENOSCOPY (EGD) WITH ESOPHAGEAL DILATION  11/23/2011   SLF: Stricture was found at the gastroesophageal junction/ Small hiatal hernia/  Sessile polyp was found in the gastric body and gastric fundus/ Stenosis was found at the pylorus/ The duodenal mucosa showed no abnormalities in the bulb and second portion of the duodenum   ESOPHAGOGASTRODUODENOSCOPY (EGD) WITH ESOPHAGEAL DILATION  12/03/2011   CBS:WHQPR was found in the entire examined stomach/Stricture was found at the gastroesophageal junction   LAPAROSCOPY  2000   -stomach pain-pt reports negative   ROTATOR CUFF REPAIR     SAVORY DILATION N/A 01/24/2017   Procedure: SAVORY DILATION;  Surgeon: Danie Binder, MD;  [facility];  Service: Endoscopy;  Laterality: N/A;   SAVORY DILATION N/A 02/08/2017   Procedure: SAVORY DILATION;  Surgeon: Danie Binder, MD;  [facility];  Service: Endoscopy;  Laterality: N/A;   SAVORY DILATION N/A 04/12/2017   Procedure: SAVORY DILATION;  Surgeon: Danie Binder, MD;  [facility];  Service: Endoscopy;  Laterality: N/A;    There were no vitals filed for this visit.   Subjective Assessment - 07/13/20 1012     Subjective Pt reported she has more pain since the weekend when she attempted to perform HEP.    Patient Stated Goals less pain, "do my housework and other thing"    Currently in Pain? Yes    Pain Score 5     Pain Location Hip    Pain Orientation Right    Pain Descriptors /  Indicators Aching;Other (Comment)   grabbing   Pain Type Chronic pain    Pain Radiating Towards LBP, R groin    Pain Onset More than a month ago    Pain Frequency Constant    Aggravating Factors  activity    Pain Relieving Factors reposiitioning                     Vestibular Assessment - 07/13/20 1023       Orthostatics   BP supine (x 5 minutes) 149/59    HR supine (x 5 minutes) 59    BP sitting 148/59   no lightheadedness   HR sitting 59    BP standing (after 1 minute) 141/62   no lightheadedness   HR standing (after 1 minute) 68    Orthostatics Comment Pt did not take BP meds this morning (she forgot).                      [facility] Adult PT Treatment/Exercise - 07/13/20 1121       Manual Therapy   Manual Therapy Joint mobilization;Soft tissue mobilization    Manual therapy comments Pt reported 2-3/10 pain after manual therapy vs. 5-6/10.    Joint Mobilization PT performed long axis distraction at RLE and short arm distraction at R hip to decr. pain, 3x30 sec/axis. PT provided sustained PA to sacrum with pt performing PF contractions in L sidelying. R iliac PA 2x30 sec with pt in sidleying.    Soft tissue mobilization Diaphragm: L trigger point release in sup/medial portion. R QL and paraspinal massage.                  SELF CARE:  PT Education - 07/13/20 1113     Education Details PT reviewed HEP with pt and demonstrated proper technique, which is likley why pt had pain after performing HEP over the weekend. PT discussed LLD and how proper alignment will help decr. pain. PT explained the benefits of an internal pelvic floor muscle assessment next visit. PT added diaphragmatic breathing to HEP. PT educated pt that orthostatics were negative today but it might be 2/2 pt forgetting to take BP meds today. PT educated pt on waiting prior to standing and walking when she feels lightheaded upon txfs from supine to sit and stand, to ensure safety and decr.  falls risk.    Person(s) Educated Patient    Methods Explanation;Demonstration;Tactile cues;Verbal cues;Handout    Comprehension Returned demonstration;Verbalized understanding;Need further instruction            NMR: Access Code: [email or web address] URL: [email or web address] Date: 07/13/2020 Prepared by: Geoffry Paradise  Exercises Supine Angels - 1 x daily - 7 x weekly - 1 sets - 3 reps - 30 hold Supine Diaphragmatic Breathing - 1  x daily - 7 x weekly - 1 sets - 5 reps  Pt required cues and demo for all HEP for proper technique.   PT Short Term Goals - 07/06/20 1124       PT SHORT TERM GOAL #1   Title Pt to be I in initial HEP to improve pelvic mm strength. TARGET DATE FOR ALL STGS: 7/6/2    Time 4    Period Weeks    Status New    Target Date 07/19/20      PT SHORT TERM GOAL #2   Title Pt to state that her pain is decreased to no greater than a 6/10.    Time 4    Period Weeks    Status New      PT SHORT TERM GOAL #3   Title Pt to state that her pelvic pain frequency has decreased by 30%    Time 4    Period Weeks    Status New      PT SHORT TERM GOAL #4   Title Pt will report she uses </=3 pads per day 2/2 urinary leakage.    Baseline 5 pads/day    Time 4    Period Weeks    Status New               PT Long Term Goals - 07/06/20 1125       PT LONG TERM GOAL #1   Title Pt to be I in an advanced HEP to improve pelvic strength. TARGET DATE FOR ALL LTGS: 08/31/20    Time 10    Period Weeks    Status New      PT LONG TERM GOAL #2   Title PT pain to be no greater than a 4/10 throughout the day to allow pt to be able to complete her housework without taking breaks.    Time 10    Period Weeks    Status New      PT LONG TERM GOAL #3   Title PT to state that the frequency of her pain has decreased by 50%    Time 10    Period Weeks    Status New      PT LONG TERM GOAL #4   Title Pt will report using </=1 pad/day 2/2 urinary leakage.    Baseline  5 pads/day    Time 10    Period Weeks    Status New      PT LONG TERM GOAL #5   Title Pt will demonstrate pelvic alignment over the last 3 weeks to improve ability to contract pelvic floor and decr. pain.    Time 10    Period Weeks    Status New                   Plan - 07/13/20 1116     Clinical Impression Statement Orthostatic testing negative today, however, pt did not take BP meds prior to session as she forgot. Pt demonstrated progress during session as no LLD noted after manual therapy and pt reported decr. pain at end of session. Pt continues to require cues for proper pelvic floor technique and to relax musculature during manual therapy. Pt would benefit from internal pelvic musculature assessment next session in order to ensure pt can isolate PF muscles and to assess for trigger points in pelvic floor which could be contributing to pain. Pt would continue to benefit from skilled PT to improve pain, incontence, strength and  balance and safety during all ADLs.    Personal Factors and Comorbidities Age;Comorbidity 2;Time since onset of injury/illness/exacerbation    Comorbidities hysterectomy, constipation    Examination-Activity Limitations Locomotion Level;Other    Examination-Participation Restrictions Cleaning;Yard Work;Shop;Community Activity    Stability/Clinical Decision Making Evolving/Moderate complexity    Rehab Potential Good    PT Frequency 1x / week    PT Duration Other (comment)   10 weeks   PT Treatment/Interventions ADLs/Self Care Home Management;Therapeutic activities;Therapeutic exercise;Patient/family education;Manual techniques;Scar mobilization;Other (comment);Biofeedback;Neuromuscular re-education;Dry needling;Spinal Manipulations;Joint Manipulations;Gait training;Stair training;Functional mobility training;DME Instruction   laser   PT Next Visit Plan Assess for orthostatic hypotension at beginning of session. Assess for DR and diaphragm. Continue with  manual to decrease adhesion, pelvic and postural strengthening exercises. Review HEP, try open book, piriformis stretch. Sacrum PA with PF contraction.    PT Home Exercise Plan Provided Tx spine ext. in supine with elbows flexed. Ceased: transverse abdominal set, rolling marble from belly button to pelvic bone and back 10 x each x 3x/day.    Consulted and Agree with Plan of Care Patient             Patient will benefit from skilled therapeutic intervention in order to improve the following deficits and impairments:  Pain, Increased fascial restricitons, Decreased strength, Postural dysfunction, Decreased coordination, Abnormal gait, Decreased balance, Decreased endurance, Hypomobility, Impaired flexibility, Impaired UE functional use  Visit Diagnosis: Pelvic pain  Other lack of coordination  Mixed incontinence  Muscle weakness (generalized)     Problem List Patient Active Problem List   Diagnosis Date Noted   Encounter for fitting and adjustment of pessary 06/08/2019   Hoarseness or changing voice 05/29/2017   Early satiety    Dyspepsia 05/17/2016   Constipation 04/17/2012   Dysuria 11/14/2011   Dysphagia 02/19/2011   Asthma 01/07/2011   Benign hypertension 01/07/2011   Hyperlipidemia 01/07/2011   Anxiety 01/07/2011   Hypothyroidism 01/07/2011   GERD (gastroesophageal reflux disease) 01/07/2011    Bexleigh Theriault L 07/13/2020, 11:24 AM  [place] MAIN [facility] SERVICES [address] [place], [place], [address] Phone: [phone]   Fax:  [phone]  Name: Kelly Hebert MRN: [id number] Date of Birth: Jun 15, 1940  Geoffry Paradise, PT,DPT 07/13/20 11:25 AM Phone: [phone] Fax: [phone]

## 2020-07-20 ENCOUNTER — Ambulatory Visit: Payer: Medicare Other

## 2020-08-03 ENCOUNTER — Other Ambulatory Visit: Payer: Self-pay

## 2020-08-03 ENCOUNTER — Ambulatory Visit: Payer: Medicare Other | Attending: Obstetrics and Gynecology

## 2020-08-03 DIAGNOSIS — N3946 Mixed incontinence: Secondary | ICD-10-CM | POA: Insufficient documentation

## 2020-08-03 DIAGNOSIS — M6281 Muscle weakness (generalized): Secondary | ICD-10-CM | POA: Insufficient documentation

## 2020-08-03 DIAGNOSIS — R102 Pelvic and perineal pain: Secondary | ICD-10-CM | POA: Diagnosis not present

## 2020-08-03 DIAGNOSIS — R278 Other lack of coordination: Secondary | ICD-10-CM | POA: Diagnosis present

## 2020-08-03 NOTE — Patient Instructions (Signed)
Toileting posture:  1) Peeing posture: feet flat, lean forward and forearms on legs to relax pelvic floor.  2) Pooping posture: feet up on squatty potty (or stool so knees are higher than hips), lean forward to relax pelvic floor.  

## 2020-08-03 NOTE — Therapy (Signed)
[place] MAIN [facility] SERVICES [address] [place], [place], [address] Phone: [phone]   Fax:  [phone]  Physical Therapy Treatment  Patient Details  Name: Kelly Hebert MRN: [id number] Date of Birth: [birth year] Referring Provider (PT): Carmel Sacramento   Encounter Date: 08/03/2020   PT End of Session - 08/03/20 1515     Visit Number 4    Number of Visits 12    Date for PT Re-Evaluation 10/04/20    Authorization Type UHC medicare/medicaid    Progress Note Due on Visit 10    PT Start Time 6283    PT Stop Time 1505    PT Time Calculation (min) 63 min    Activity Tolerance Patient tolerated treatment well    Behavior During Therapy [facility] for tasks assessed/performed             Past Medical History:  Diagnosis Date   Acid reflux    Anxiety    Arthritis    Asthma    Cancer ([place])    endometrial cancer   Constipation    COPD (chronic obstructive pulmonary disease) ([place])    Depression    Gout    Heart murmur    Hx of pyloric stenosis 11/14/2011   Hypercholesteremia    Hyperglycemia    Hypertension    Hypothyroidism    Renal disorder    pelvic kidney   Retinitis pigmentosa    S/P colonoscopy 2003   Dr. Nicolasa Ducking: normal colon, normal path.    Stomach ulcer    Vitamin D deficiency disease     Past Surgical History:  Procedure Laterality Date   ABDOMINAL HYSTERECTOMY     complete   BREAST SURGERY     breast biopsy    COLONOSCOPY  03/09/2011   MOQ:HUTMLYY diverticula, small internal hemorrhoids.    ESOPHAGOGASTRODUODENOSCOPY   03/09/2011   TKP:TWSFKCLEX in the distal esophagus/ Stenosis at the pylorus/ Mild gastritis/ Hiatal hernia. gastric bx showed gastritis but no H.pylori   ESOPHAGOGASTRODUODENOSCOPY  03/20/11   SLF: stricture distal esophgus and stenosis at pyloris likely from NSAIDs. s/p dialtion.    ESOPHAGOGASTRODUODENOSCOPY N/A 01/24/2017   Procedure: ESOPHAGOGASTRODUODENOSCOPY (EGD);  Surgeon: Danie Binder,  MD;  [facility];  Service: Endoscopy;  Laterality: N/A;  10:45am   ESOPHAGOGASTRODUODENOSCOPY N/A 02/08/2017   Procedure: ESOPHAGOGASTRODUODENOSCOPY (EGD);  Surgeon: Danie Binder, MD;  [facility];  Service: Endoscopy;  Laterality: N/A;  12:45pm   ESOPHAGOGASTRODUODENOSCOPY N/A 04/12/2017   Procedure: ESOPHAGOGASTRODUODENOSCOPY (EGD);  Surgeon: Danie Binder, MD;  [facility];  Service: Endoscopy;  Laterality: N/A;  1:30pm   ESOPHAGOGASTRODUODENOSCOPY (EGD) WITH ESOPHAGEAL DILATION  11/23/2011   SLF: Stricture was found at the gastroesophageal junction/ Small hiatal hernia/  Sessile polyp was found in the gastric body and gastric fundus/ Stenosis was found at the pylorus/ The duodenal mucosa showed no abnormalities in the bulb and second portion of the duodenum   ESOPHAGOGASTRODUODENOSCOPY (EGD) WITH ESOPHAGEAL DILATION  12/03/2011   NTZ:GYFVC was found in the entire examined stomach/Stricture was found at the gastroesophageal junction   LAPAROSCOPY  2000   [place]-stomach pain-pt reports negative   ROTATOR CUFF REPAIR     SAVORY DILATION N/A 01/24/2017   Procedure: SAVORY DILATION;  Surgeon: Danie Binder, MD;  [facility];  Service: Endoscopy;  Laterality: N/A;   SAVORY DILATION N/A 02/08/2017   Procedure: SAVORY DILATION;  Surgeon: Danie Binder, MD;  [facility];  Service: Endoscopy;  Laterality: N/A;   SAVORY DILATION N/A 04/12/2017   Procedure: SAVORY DILATION;  Surgeon: Danie Binder, MD;  [facility];  Service: Endoscopy;  Laterality: N/A;    There were no vitals filed for this visit.   Subjective Assessment - 08/03/20 1407     Subjective Pt reported she is still experiencing SUI. MD gave her prednisone taper because she was congested. Pt reported incr. groin and abdominal pain after cooking (standing up) for one hour. She's also experienced some back pain.    Pertinent History hysterectomy    Patient  Stated Goals less pain, "do my housework and other thing"    Currently in Pain? No/denies                     Vestibular Assessment - 08/03/20 1416       Orthostatics   BP supine (x 5 minutes) 152/65   no dizziness   HR supine (x 5 minutes) 83    BP sitting 152/63   no dizziness   HR sitting 84    BP standing (after 1 minute) 147/57    HR standing (after 1 minute) 83    Orthostatics Comment Pt took BP medication today.                   Pelvic Floor Special Questions - 08/03/20 1519     Pelvic Floor Internal Exam Yes    Exam Type Vaginal    Sensation intact    Strength fair squeeze, definite lift    Strength # of reps 2    Strength # of seconds --   1-2 seconds.   Tone Hypertonicity               [facility] Adult PT Treatment/Exercise - 08/03/20 1516       Manual Therapy   Manual Therapy Internal Pelvic Floor    Manual therapy comments Pt reported less tension after manual therapy.    Internal Pelvic Floor Pt reported 3/10 cramping pain during palpation of R levator ani and obturator internus and 1/10 tenderness during L levator ani and OI palpation. PT performed trigger point release with pt focusing on breath to relax pelvic floor during manual therapy. Cues to reduce pelvic floor contraction and tension during inhalation, pt progressed to relaxing pelvic floor during manual therapy.                  NMR: Access Code: [email or web address] URL: [email or web address] Date: 08/03/2020 Prepared by: Geoffry Paradise  Exercises Supine Angels - 1 x daily - 7 x weekly - 1 sets - 3 reps - 30 hold Supine Diaphragmatic Breathing - 1 x daily - 7 x weekly - 1 sets - 5 reps Supine Hip Adductor Stretch - 2 x daily - 7 x weekly - 1 sets - 3 reps - 30-60 hold Reviewed HEP and added stretch. Pt attempted happy baby and child's pose to relax pelvic floor but unable to perform. PT provided cues and demo for proper technique and breathing during  stretch.  SELF CARE:  PT Education - 08/03/20 1513     Education Details PT educated pt on internal pelvic floor muscle assessment and the need to relax PF muscles prior to attempting pelvic floor contractions in order to improve strength, as tight muscles do not allow efficient contraction. PT educated pt on proper toileting technique to reduce strain during bowel movements and emptying bladder fully by relaxing pelvic  floor. Pt unable to perform happy baby or child's pose to relax pelvic floor, therefore, PT modified to supine B hip add stretch with breathing.    Person(s) Educated Patient    Methods Explanation;Demonstration;Verbal cues;Handout;Tactile cues    Comprehension Verbalized understanding;Returned demonstration;Need further instruction              PT Short Term Goals - 07/06/20 1124       PT SHORT TERM GOAL #1   Title Pt to be I in initial HEP to improve pelvic mm strength. TARGET DATE FOR ALL STGS: 7/6/2    Time 4    Period Weeks    Status New    Target Date 07/19/20      PT SHORT TERM GOAL #2   Title Pt to state that her pain is decreased to no greater than a 6/10.    Time 4    Period Weeks    Status New      PT SHORT TERM GOAL #3   Title Pt to state that her pelvic pain frequency has decreased by 30%    Time 4    Period Weeks    Status New      PT SHORT TERM GOAL #4   Title Pt will report she uses </=3 pads per day 2/2 urinary leakage.    Baseline 5 pads/day    Time 4    Period Weeks    Status New               PT Long Term Goals - 07/06/20 1125       PT LONG TERM GOAL #1   Title Pt to be I in an advanced HEP to improve pelvic strength. TARGET DATE FOR ALL LTGS: 08/31/20    Time 10    Period Weeks    Status New      PT LONG TERM GOAL #2   Title PT pain to be no greater than a 4/10 throughout the day to allow pt to be able to complete her housework without taking breaks.    Time 10    Period Weeks    Status New      PT LONG TERM GOAL  #3   Title PT to state that the frequency of her pain has decreased by 50%    Time 10    Period Weeks    Status New      PT LONG TERM GOAL #4   Title Pt will report using </=1 pad/day 2/2 urinary leakage.    Baseline 5 pads/day    Time 10    Period Weeks    Status New      PT LONG TERM GOAL #5   Title Pt will demonstrate pelvic alignment over the last 3 weeks to improve ability to contract pelvic floor and decr. pain.    Time 10    Period Weeks    Status New                   Plan - 08/03/20 1519     Clinical Impression Statement PT performed internal pelvic floor muscle assessment today, with pt able to contract and hold for 1-2 seconds, 2 reps, 3/5 MMT. Incr. tone noted B (R>L) with more pain on R side of pelvic floor. Pt was able to relax with cues and cease pelvic muscle contraction with inhalation with cues. Pt was negative for orthostatics today, after taking BP meds this morning. PT modified pelvic/hip relaxation stretch  to accommodate pt in order to [facility] gains made during session. Pt would continue to benefit from skilled PT to improve safety, leakage, strength and balance during all ADLs. Goals pushed back one week 2/2 PT not being here last week.    Personal Factors and Comorbidities Age;Comorbidity 2;Time since onset of injury/illness/exacerbation    Comorbidities hysterectomy, constipation    Examination-Activity Limitations Locomotion Level;Other    Examination-Participation Restrictions Cleaning;Yard Work;Shop;Community Activity    Stability/Clinical Decision Making Evolving/Moderate complexity    Rehab Potential Good    PT Frequency 1x / week    PT Duration Other (comment)   10 weeks   PT Treatment/Interventions ADLs/Self Care Home Management;Therapeutic activities;Therapeutic exercise;Patient/family education;Manual techniques;Scar mobilization;Other (comment);Biofeedback;Neuromuscular re-education;Dry needling;Spinal Manipulations;Joint Manipulations;Gait  training;Stair training;Functional mobility training;DME Instruction   laser   PT Next Visit Plan Check STGs. Assess for DR and diaphragm. Continue with manual to decrease adhesion, pelvic and postural strengthening exercises. Review HEP, try open book, piriformis stretch. Sacrum PA with PF contraction.    PT Home Exercise Plan Provided Tx spine ext. in supine with elbows flexed. Ceased: transverse abdominal set, rolling marble from belly button to pelvic bone and back 10 x each x 3x/day.    Consulted and Agree with Plan of Care Patient             Patient will benefit from skilled therapeutic intervention in order to improve the following deficits and impairments:  Pain, Increased fascial restricitons, Decreased strength, Postural dysfunction, Decreased coordination, Abnormal gait, Decreased balance, Decreased endurance, Hypomobility, Impaired flexibility, Impaired UE functional use  Visit Diagnosis: Pelvic pain  Mixed incontinence  Other lack of coordination  Muscle weakness (generalized)     Problem List Patient Active Problem List   Diagnosis Date Noted   Encounter for fitting and adjustment of pessary 06/08/2019   Hoarseness or changing voice 05/29/2017   Early satiety    Dyspepsia 05/17/2016   Constipation 04/17/2012   Dysuria 11/14/2011   Dysphagia 02/19/2011   Asthma 01/07/2011   Benign hypertension 01/07/2011   Hyperlipidemia 01/07/2011   Anxiety 01/07/2011   Hypothyroidism 01/07/2011   GERD (gastroesophageal reflux disease) 01/07/2011    Buelah Rennie L 08/03/2020, 3:24 PM  [place] MAIN [facility] SERVICES [place], [place], [address] Phone: [phone]   Fax:  [phone]  Name: EMNET MONK MRN: [id number] Date of Birth: [birth year]   Geoffry Paradise, PT,DPT 08/03/20 3:26 PM Phone: [phone] Fax: [phone]

## 2020-08-10 ENCOUNTER — Ambulatory Visit: Payer: Medicare Other

## 2020-08-17 ENCOUNTER — Ambulatory Visit: Payer: Medicare Other

## 2020-08-17 ENCOUNTER — Other Ambulatory Visit: Payer: Self-pay

## 2020-08-17 DIAGNOSIS — R278 Other lack of coordination: Secondary | ICD-10-CM

## 2020-08-17 DIAGNOSIS — N3946 Mixed incontinence: Secondary | ICD-10-CM

## 2020-08-17 DIAGNOSIS — R102 Pelvic and perineal pain: Secondary | ICD-10-CM | POA: Diagnosis not present

## 2020-08-17 DIAGNOSIS — M6281 Muscle weakness (generalized): Secondary | ICD-10-CM

## 2020-08-17 NOTE — Therapy (Signed)
[place] [facility] SERVICES [address] [place], [place], [address] Phone: [phone]   Fax:  [phone]  Physical Therapy Treatment  Patient Details  Name: Kelly Hebert MRN: [id number] Date of Birth: 1940-09-23 Referring Provider (PT): Carmel Sacramento   Encounter Date: 08/17/2020   PT End of Session - 08/17/20 1357     Visit Number 5    Number of Visits 12    Date for PT Re-Evaluation 10/04/20    Authorization Type [employer] medicare/medicaid    Progress Note Due on Visit 10    PT Start Time 1401   d/c   PT Stop Time 7253    PT Time Calculation (min) 44 min    Activity Tolerance Patient tolerated treatment well    Behavior During Therapy [facility] for tasks assessed/performed             Past Medical History:  Diagnosis Date   Acid reflux    Anxiety    Arthritis    Asthma    Cancer ([place])    endometrial cancer   Constipation    COPD (chronic obstructive pulmonary disease) ([place])    Depression    Gout    Heart murmur    Hx of pyloric stenosis 11/14/2011   Hypercholesteremia    Hyperglycemia    Hypertension    Hypothyroidism    Renal disorder    pelvic kidney   Retinitis pigmentosa    S/P colonoscopy 2003   Dr. Nicolasa Ducking: normal colon, normal path.    Stomach ulcer    Vitamin D deficiency disease     Past Surgical History:  Procedure Laterality Date   ABDOMINAL HYSTERECTOMY     complete   BREAST SURGERY     breast biopsy    COLONOSCOPY  03/09/2011   GUY:QIHKVQQ diverticula, small internal hemorrhoids.    ESOPHAGOGASTRODUODENOSCOPY   03/09/2011   VZD:GLOVFIEPP in the distal esophagus/ Stenosis at the pylorus/ Mild gastritis/ Hiatal hernia. gastric bx showed gastritis but no H.pylori   ESOPHAGOGASTRODUODENOSCOPY  03/20/11   SLF: stricture distal esophgus and stenosis at pyloris likely from NSAIDs. s/p dialtion.    ESOPHAGOGASTRODUODENOSCOPY N/A 01/24/2017   Procedure: ESOPHAGOGASTRODUODENOSCOPY (EGD);  Surgeon: Danie Binder, MD;  [facility];  Service: Endoscopy;  Laterality: N/A;  10:45am   ESOPHAGOGASTRODUODENOSCOPY N/A 02/08/2017   Procedure: ESOPHAGOGASTRODUODENOSCOPY (EGD);  Surgeon: Danie Binder, MD;  [facility];  Service: Endoscopy;  Laterality: N/A;  12:45pm   ESOPHAGOGASTRODUODENOSCOPY N/A 04/12/2017   Procedure: ESOPHAGOGASTRODUODENOSCOPY (EGD);  Surgeon: Danie Binder, MD;  [facility]: [facility];  Service: Endoscopy;  Laterality: N/A;  1:30pm   ESOPHAGOGASTRODUODENOSCOPY (EGD) WITH ESOPHAGEAL DILATION  11/23/2011   SLF: Stricture was found at the gastroesophageal junction/ Small hiatal hernia/  Sessile polyp was found in the gastric body and gastric fundus/ Stenosis was found at the pylorus/ The duodenal mucosa showed no abnormalities in the bulb and second portion of the duodenum   ESOPHAGOGASTRODUODENOSCOPY (EGD) WITH ESOPHAGEAL DILATION  12/03/2011   IRJ:JOACZ was found in the entire examined stomach/Stricture was found at the gastroesophageal junction   LAPAROSCOPY  2000   [place]-stomach pain-pt reports negative   ROTATOR CUFF REPAIR     SAVORY DILATION N/A 01/24/2017   Procedure: SAVORY DILATION;  Surgeon: Danie Binder, MD;  [facility];  Service: Endoscopy;  Laterality: N/A;   SAVORY DILATION N/A 02/08/2017   Procedure: SAVORY DILATION;  Surgeon: Danie Binder, MD;  Location:  [facility];  Service: Endoscopy;  Laterality: N/A;   SAVORY DILATION N/A 04/12/2017   Procedure: SAVORY DILATION;  Surgeon: Danie Binder, MD;  [facility]: [facility];  Service: Endoscopy;  Laterality: N/A;    There were no vitals filed for this visit.   Subjective Assessment - 08/17/20 1305     Subjective Pt reported she would like today to be her last visit 2/2 cost, as she pays someone $50 to get here. Pt has been feeling well re: pelvic floor. She did get a cramp when mopping on Saturday. Pt reported stress 2/2 grandchildren staying with her, as they fight often.     Pertinent History hysterectomy    Patient Stated Goals less pain, "do my housework and other thing"    Currently in Pain? No/denies                    NMR: Access Code: [email or web address] URL: [email or web address] Date: 08/17/2020 Prepared by: Geoffry Paradise  Exercises Supine Angels - 1 x daily - 7 x weekly - 1 sets - 3 reps - 30 hold Supine Diaphragmatic Breathing - 1 x daily - 7 x weekly - 1 sets - 5 reps Supine Hip Adductor Stretch - 2 x daily - 7 x weekly - 1 sets - 3 reps - 30-60 hold Supine Anterior Pelvic Tilt - 1 x daily - 7 x weekly - 1 sets - 10 reps Supine Posterior Pelvic Tilt - 1 x daily - 7 x weekly - 1 sets - 10 reps Supine Piriformis Stretch with Leg Straight - 1 x daily - 7 x weekly - 1 sets - 10 reps - 30-45 hold Cues and demo during all activities for proper technique. Performed with S to ensure safety. No pain reported.   Patient Education Household Activities (no rotating during mopping as pt reported incr. R groin pain with mopping but she was rotating at the spine).                SELF CARE:   PT Education - 08/17/20 1354     Education Details PT reviewed goal progress and HEP-adding to HEP as indicated. PT educated pt that continuing PT would be best 2/2 pelvic floor hypertonicity incr. pelvic pain and decr. strength. However, pt unable to afford additional visits, therefore, PT reviewed and progressed HEP. PT also encouraged pt to find a therapist for her and her family; PT reported her family starts therapy next week. PT educated pt on how stress can affect tension in pelvic floor and the importance of taking care of herself. PT educated pt on proper posture and technique to avoid back/pelvic pain during ADLs.    Person(s) Educated Patient    Methods Explanation;Demonstration;Verbal cues;Handout    Comprehension Returned demonstration;Verbalized understanding              PT Short Term Goals - 08/17/20 1308       PT  SHORT TERM GOAL #1   Title Pt to be I in initial HEP to improve pelvic mm strength. TARGET DATE FOR ALL STGS: 7/6/2    Time 4    Period Weeks    Status Partially Met    Target Date 07/19/20      PT SHORT TERM GOAL #2   Title Pt to state that her pain is decreased to no greater than a 6/10.    Baseline 5/10 at worst on 08/17/20    Time 4    Period Weeks  Status Achieved      PT SHORT TERM GOAL #3   Title Pt to state that her pelvic pain frequency has decreased by 30%    Baseline Decr by 50% on 08/17/20    Time 4    Period Weeks    Status Achieved      PT SHORT TERM GOAL #4   Title Pt will report she uses </=3 pads per day 2/2 urinary leakage.    Baseline 5 pads/day baseline, 08/17/20: 3 pads per day    Time 4    Period Weeks    Status Achieved               PT Long Term Goals - 08/17/20 1312       PT LONG TERM GOAL #1   Title Pt to be I in an advanced HEP to improve pelvic strength. TARGET DATE FOR ALL LTGS: 08/31/20    Time 10    Period Weeks    Status New      PT LONG TERM GOAL #2   Title PT pain to be no greater than a 4/10 throughout the day to allow pt to be able to complete her housework without taking breaks.    Time 10    Period Weeks    Status New      PT LONG TERM GOAL #3   Title PT to state that the frequency of her pain has decreased by 50%    Baseline 50% on 08/17/20    Time 10    Period Weeks    Status Achieved      PT LONG TERM GOAL #4   Title Pt will report using </=1 pad/day 2/2 urinary leakage.    Baseline 5 pads/day    Time 10    Period Weeks    Status Partially Met      PT LONG TERM GOAL #5   Title Pt will demonstrate pelvic alignment over the last 3 weeks to improve ability to contract pelvic floor and decr. pain.    Time 10    Period Weeks    Status New                   Plan - 08/17/20 1359     Clinical Impression Statement Today's skilled session focused on assessing goals as pt requested today be her last visit,  as she could no longer afford transportation to appointments. Pt demonstrated progress as she met STGs 2, 3, 4 and LTG 3. Pt partially met STG 1 and LTG 4. Please d/c summary for details.    Personal Factors and Comorbidities Age;Comorbidity 2;Time since onset of injury/illness/exacerbation    Comorbidities hysterectomy, constipation    Examination-Activity Limitations Locomotion Level;Other    Examination-Participation Restrictions Cleaning;Yard Work;Shop;Community Activity    Stability/Clinical Decision Making Evolving/Moderate complexity    Rehab Potential Good    PT Frequency 1x / week    PT Duration Other (comment)   10 weeks   PT Treatment/Interventions ADLs/Self Care Home Management;Therapeutic activities;Therapeutic exercise;Patient/family education;Manual techniques;Scar mobilization;Other (comment);Biofeedback;Neuromuscular re-education;Dry needling;Spinal Manipulations;Joint Manipulations;Gait training;Stair training;Functional mobility training;DME Instruction   laser   PT Next Visit Plan d/c    PT Home Exercise Plan Provided Tx spine ext. in supine with elbows flexed. Ceased: transverse abdominal set, rolling marble from belly button to pelvic bone and back 10 x each x 3x/day.    Consulted and Agree with Plan of Care Patient  Patient will benefit from skilled therapeutic intervention in order to improve the following deficits and impairments:  Pain, Increased fascial restricitons, Decreased strength, Postural dysfunction, Decreased coordination, Abnormal gait, Decreased balance, Decreased endurance, Hypomobility, Impaired flexibility, Impaired UE functional use  Visit Diagnosis: Pelvic pain  Mixed incontinence  Muscle weakness (generalized)  Other lack of coordination     Problem List Patient Active Problem List   Diagnosis Date Noted   Encounter for fitting and adjustment of pessary 06/08/2019   Hoarseness or changing voice 05/29/2017   Early satiety     Dyspepsia 05/17/2016   Constipation 04/17/2012   Dysuria 11/14/2011   Dysphagia 02/19/2011   Asthma 01/07/2011   Benign hypertension 01/07/2011   Hyperlipidemia 01/07/2011   Anxiety 01/07/2011   Hypothyroidism 01/07/2011   GERD (gastroesophageal reflux disease) 01/07/2011    Micaella Gitto L 08/17/2020, 2:01 PM  [place] [facility] SERVICES [address] [place], [place], [address] Phone: [phone]   Fax:  [phone]  Name: Kelly Hebert MRN: [id number] Date of Birth: 01-21-1941   PHYSICAL THERAPY DISCHARGE SUMMARY  Visits from Start of Care: 5  Current functional level related to goals / functional outcomes:  PT Short Term Goals - 08/17/20 1308       PT SHORT TERM GOAL #1   Title Pt to be I in initial HEP to improve pelvic mm strength. TARGET DATE FOR ALL STGS: 7/6/2    Time 4    Period Weeks    Status Partially Met    Target Date 07/19/20      PT SHORT TERM GOAL #2   Title Pt to state that her pain is decreased to no greater than a 6/10.    Baseline 5/10 at worst on 08/17/20    Time 4    Period Weeks    Status Achieved      PT SHORT TERM GOAL #3   Title Pt to state that her pelvic pain frequency has decreased by 30%    Baseline Decr by 50% on 08/17/20    Time 4    Period Weeks    Status Achieved      PT SHORT TERM GOAL #4   Title Pt will report she uses </=3 pads per day 2/2 urinary leakage.    Baseline 5 pads/day baseline, 08/17/20: 3 pads per day    Time 4    Period Weeks    Status Achieved             PT Long Term Goals - 08/17/20 1312       PT LONG TERM GOAL #1   Title Pt to be I in an advanced HEP to improve pelvic strength. TARGET DATE FOR ALL LTGS: 08/31/20    Time 10    Period Weeks    Status New      PT LONG TERM GOAL #2   Title PT pain to be no greater than a 4/10 throughout the day to allow pt to be able to complete her housework without taking breaks.    Time 10    Period Weeks     Status New      PT LONG TERM GOAL #3   Title PT to state that the frequency of her pain has decreased by 50%    Baseline 50% on 08/17/20    Time 10    Period Weeks    Status Achieved      PT LONG TERM GOAL #4   Title  Pt will report using </=1 pad/day 2/2 urinary leakage.    Baseline 5 pads/day    Time 10    Period Weeks    Status Partially Met      PT LONG TERM GOAL #5   Title Pt will demonstrate pelvic alignment over the last 3 weeks to improve ability to contract pelvic floor and decr. pain.    Time 10    Period Weeks    Status New               Remaining deficits: Decr. Strength, flexibility, impaired posture and pain.   Education / Equipment: HEP   Patient agrees to discharge. Patient goals were partially met. Patient is being discharged due to financial reasons.  Geoffry Paradise, PT,DPT 08/17/20 2:04 PM Phone: [phone] Fax: [phone]

## 2020-08-24 ENCOUNTER — Ambulatory Visit: Payer: Medicare Other

## 2020-08-31 ENCOUNTER — Ambulatory Visit: Payer: Medicare Other

## 2020-12-05 ENCOUNTER — Encounter: Payer: Self-pay | Admitting: *Deleted

## 2020-12-06 ENCOUNTER — Ambulatory Visit: Payer: Medicare Other | Admitting: Cardiology

## 2020-12-06 NOTE — Progress Notes (Incomplete)
Clinical Summary Kelly Hebert is a 80 y.o.female    1. Carotid stenosis - 03/2016 carotid US with LICA 29-92%, large plaque RICA without significant stenosis - 04/2016 CTA neck bulkly plaque LICA 42% stenosis, RICA 35%   - denies any neuro symptoms.      2. Chest pain - occasional left sided, pinching pain. Tends to occur with activity. No other associated symptoms - lasts just a few seconds. Occurs about every other month - highest level of activity is housework which she tolerates    3. HTN - has not taken meds yet today.   4. Hyperlipidemia - compliant with statin.      Past Medical History:  Diagnosis Date   Acid reflux    Anxiety    Arthritis    Asthma    Cancer ([place])    endometrial cancer   Constipation    COPD (chronic obstructive pulmonary disease) ([facility])    Depression    Gout    Heart murmur    Hx of pyloric stenosis 11/14/2011   Hypercholesteremia    Hyperglycemia    Hypertension    Hypothyroidism    Renal disorder    pelvic kidney   Retinitis pigmentosa    S/P colonoscopy 2003   Dr. Nicolasa Ducking: normal colon, normal path.    Stomach ulcer    Vitamin D deficiency disease      Allergies  Allergen Reactions   Ivp Dye [Iodinated Diagnostic Agents] Hives, Itching and Other (See Comments)    Pt. States this happened 4 or 5 years ago in [place].     Nifedipine Nausea And Vomiting and Other (See Comments)    unknown     Current Outpatient Medications  Medication Sig Dispense Refill   acetaminophen (TYLENOL) 500 MG tablet Take 500 mg by mouth every 6 (six) hours as needed.     albuterol (PROVENTIL HFA;VENTOLIN HFA) 108 (90 BASE) MCG/ACT inhaler Inhale 2 puffs into the lungs every 6 (six) hours as needed for wheezing or shortness of breath.      amitriptyline (ELAVIL) 10 MG tablet Take 10 mg by mouth at bedtime.      amLODipine (NORVASC) 10 MG tablet Take 10 mg by mouth daily.     aspirin EC 81 MG tablet Take 81 mg by mouth daily.     atorvastatin  (LIPITOR) 40 MG tablet Take 40 mg by mouth every evening.   5   benazepril-hydrochlorthiazide (LOTENSIN HCT) 20-25 MG tablet Take 1 tablet by mouth daily.      buPROPion (WELLBUTRIN XL) 150 MG 24 hr tablet      cholecalciferol (VITAMIN D) 1000 units tablet Take 2,000 Units by mouth daily.  (Patient not taking: Reported on 08/04/2019)     citalopram (CELEXA) 40 MG tablet Take 40 mg by mouth daily.       DEXILANT 60 MG capsule TAKE 1 CAPSULE BY MOUTH EVERY DAY 30 capsule 5   DEXILANT 60 MG capsule TAKE 1 CAPSULE BY MOUTH EVERY DAY 30 capsule 5   fluticasone (FLOVENT HFA) 110 MCG/ACT inhaler Inhale 2 puffs into the lungs 2 (two) times daily as needed (for shortness of breath).      gabapentin (NEURONTIN) 300 MG capsule Take 300 mg by mouth at bedtime. (Patient not taking: Reported on 08/04/2019)     ibuprofen (ADVIL) 200 MG tablet Take 200 mg by mouth every 6 (six) hours as needed.     levothyroxine (SYNTHROID, LEVOTHROID) 50 MCG tablet Take 50 mcg  by mouth daily before breakfast.      LINZESS 145 MCG CAPS capsule TAKE 1 CAPSULE BY MOUTH EVERY DAY (Patient taking differently: TAKE 1 CAPSULE BY MOUTH EVERY DAY AS NEEDED FOR CONSTIPATION) 90 capsule 3   LORazepam (ATIVAN) 1 MG tablet Take 1 mg by mouth 2 (two) times daily as needed for anxiety.      potassium chloride (KLOR-CON) 20 MEQ packet Take 20 mEq by mouth once.     predniSONE (STERAPRED UNI-PAK 21 TAB) 5 MG (21) TBPK tablet Take 5 mg by mouth daily.     Vitamin D, Ergocalciferol, (DRISDOL) 1.25 MG (50000 UT) CAPS capsule Take 1 capsule by mouth once a week.     No current facility-administered medications for this visit.     Past Surgical History:  Procedure Laterality Date   ABDOMINAL HYSTERECTOMY     complete   BREAST SURGERY     breast biopsy    COLONOSCOPY  03/09/2011   TZG:[id number] diverticula, small internal hemorrhoids.    ESOPHAGOGASTRODUODENOSCOPY   03/09/2011   HQP:RFFMBWGYK in the distal esophagus/ Stenosis at the pylorus/  Mild gastritis/ Hiatal hernia. gastric bx showed gastritis but no H.pylori   ESOPHAGOGASTRODUODENOSCOPY  03/20/11   SLF: stricture distal esophgus and stenosis at pyloris likely from NSAIDs. s/p dialtion.    ESOPHAGOGASTRODUODENOSCOPY N/A 01/24/2017   Procedure: ESOPHAGOGASTRODUODENOSCOPY (EGD);  Surgeon: Danie Binder, MD;  [facility];  Service: Endoscopy;  Laterality: N/A;  10:45am   ESOPHAGOGASTRODUODENOSCOPY N/A 02/08/2017   Procedure: ESOPHAGOGASTRODUODENOSCOPY (EGD);  Surgeon: Danie Binder, MD;  [facility];  Service: Endoscopy;  Laterality: N/A;  12:45pm   ESOPHAGOGASTRODUODENOSCOPY N/A 04/12/2017   Procedure: ESOPHAGOGASTRODUODENOSCOPY (EGD);  Surgeon: Danie Binder, MD;  [facility];  Service: Endoscopy;  Laterality: N/A;  1:30pm   ESOPHAGOGASTRODUODENOSCOPY (EGD) WITH ESOPHAGEAL DILATION  11/23/2011   SLF: Stricture was found at the gastroesophageal junction/ Small hiatal hernia/  Sessile polyp was found in the gastric body and gastric fundus/ Stenosis was found at the pylorus/ The duodenal mucosa showed no abnormalities in the bulb and second portion of the duodenum   ESOPHAGOGASTRODUODENOSCOPY (EGD) WITH ESOPHAGEAL DILATION  12/03/2011   ZLD:JTTSV was found in the entire examined stomach/Stricture was found at the gastroesophageal junction   LAPAROSCOPY  2000   [place]-stomach pain-pt reports negative   ROTATOR CUFF REPAIR     SAVORY DILATION N/A 01/24/2017   Procedure: SAVORY DILATION;  Surgeon: Danie Binder, MD;  [facility];  Service: Endoscopy;  Laterality: N/A;   SAVORY DILATION N/A 02/08/2017   Procedure: SAVORY DILATION;  Surgeon: Danie Binder, MD;  [facility];  Service: Endoscopy;  Laterality: N/A;   SAVORY DILATION N/A 04/12/2017   Procedure: SAVORY DILATION;  Surgeon: Danie Binder, MD;  [facility];  Service: Endoscopy;  Laterality: N/A;     Allergies  Allergen Reactions   Ivp Dye  [Iodinated Diagnostic Agents] Hives, Itching and Other (See Comments)    Pt. States this happened 4 or 5 years ago in [place].     Nifedipine Nausea And Vomiting and Other (See Comments)    unknown      Family History  Problem Relation Age of Onset   Kidney disease Sister    Cirrhosis Brother        ?etoh   Diabetes Brother    Heart attack Brother    Heart disease Mother    Heart attack Mother  Heart disease Father    Heart attack Father    Diabetes Brother    Cirrhosis Sister        Non-etoh   Colon cancer Neg Hx      Social History Kelly Hebert reports that she has never smoked. She has never used smokeless tobacco. Kelly Hebert reports no history of alcohol use.   Review of Systems CONSTITUTIONAL: No weight loss, fever, chills, weakness or fatigue.  HEENT: Eyes: No visual loss, blurred vision, double vision or yellow sclerae.No hearing loss, sneezing, congestion, runny nose or sore throat.  SKIN: No rash or itching.  CARDIOVASCULAR:  RESPIRATORY: No shortness of breath, cough or sputum.  GASTROINTESTINAL: No anorexia, nausea, vomiting or diarrhea. No abdominal pain or blood.  GENITOURINARY: No burning on urination, no polyuria NEUROLOGICAL: No headache, dizziness, syncope, paralysis, ataxia, numbness or tingling in the extremities. No change in bowel or bladder control.  MUSCULOSKELETAL: No muscle, back pain, joint pain or stiffness.  LYMPHATICS: No enlarged nodes. No history of splenectomy.  PSYCHIATRIC: No history of depression or anxiety.  ENDOCRINOLOGIC: No reports of sweating, cold or heat intolerance. No polyuria or polydipsia.  Marland Kitchen   Physical Examination There were no vitals filed for this visit. There were no vitals filed for this visit.  Gen: resting comfortably, no acute distress HEENT: no scleral icterus, pupils equal round and reactive, no palptable cervical adenopathy,  CV Resp: Clear to auscultation bilaterally GI: abdomen is soft, non-tender,  non-distended, normal bowel sounds, no hepatosplenomegaly MSK: extremities are warm, no edema.  Skin: warm, no rash Neuro:  no focal deficits Psych: appropriate affect   Diagnostic Studies  06/2016 echo Study Conclusions   - Left ventricle: The cavity size was normal. Systolic function was    normal. The estimated ejection fraction was in the range of 60%    to 65%. Wall motion was normal; there were no regional wall    motion abnormalities. Left ventricular diastolic function    parameters were normal.  - Aortic valve: Valve area (VTI): 1.78 cm^2. Valve area (Vmax):    1.61 cm^2. Valve area (Vmean): 1.71 cm^2.  - Left atrium: Some shadowing artifact in LA on subcostal images.  - Atrial septum: No defect or patent foramen ovale was identified.   Assessment and Plan  1. Carotid stenosis - moderate left and mild right ICA stenosis - no significant symptoms - continue to monitor at this time. Continue risk factor modication   2.Chest pain - atypical, continue to monitor at this time   3. HTN - elevated in clinic, has not taken meds yet today - continue to monitor   4. Hyperlipidemia - request labs from pcp   5. Heart murmur - obtain echo      Arnoldo Lenis, M.D., F.A.C.C.

## 2020-12-14 ENCOUNTER — Ambulatory Visit: Payer: Medicare Other | Admitting: Internal Medicine

## 2020-12-26 ENCOUNTER — Ambulatory Visit: Payer: Medicare Other

## 2021-02-15 ENCOUNTER — Ambulatory Visit: Payer: Medicare Other | Admitting: Cardiology

## 2021-02-24 ENCOUNTER — Other Ambulatory Visit: Payer: Self-pay | Admitting: Nurse Practitioner

## 2021-02-24 NOTE — Telephone Encounter (Signed)
I have refilled but needs office visit.

## 2021-02-27 ENCOUNTER — Encounter: Payer: Self-pay | Admitting: Internal Medicine

## 2021-03-06 ENCOUNTER — Encounter: Payer: Self-pay | Admitting: *Deleted

## 2021-03-21 ENCOUNTER — Ambulatory Visit: Payer: Medicaid Other | Admitting: Cardiology

## 2021-04-11 ENCOUNTER — Encounter: Payer: Self-pay | Admitting: Internal Medicine

## 2021-06-15 ENCOUNTER — Ambulatory Visit: Payer: 59 | Admitting: Gastroenterology

## 2021-07-31 ENCOUNTER — Telehealth: Payer: Self-pay

## 2021-07-31 NOTE — Telephone Encounter (Signed)
Documentation on pt from [facility] regarding a Rx you sent in for this pt. Paperwork in yellow folder on your desk

## 2021-08-03 NOTE — Telephone Encounter (Signed)
Completed and given back.

## 2021-08-16 ENCOUNTER — Encounter: Payer: Self-pay | Admitting: Internal Medicine

## 2021-08-30 ENCOUNTER — Encounter: Payer: Self-pay | Admitting: Cardiology

## 2021-08-30 ENCOUNTER — Ambulatory Visit (INDEPENDENT_AMBULATORY_CARE_PROVIDER_SITE_OTHER): Payer: 59 | Admitting: Cardiology

## 2021-08-30 VITALS — BP 138/70 | HR 82 | Ht 62.0 in | Wt 138.2 lb

## 2021-08-30 DIAGNOSIS — I10 Essential (primary) hypertension: Secondary | ICD-10-CM

## 2021-08-30 DIAGNOSIS — I6523 Occlusion and stenosis of bilateral carotid arteries: Secondary | ICD-10-CM

## 2021-08-30 DIAGNOSIS — E782 Mixed hyperlipidemia: Secondary | ICD-10-CM

## 2021-08-30 MED ORDER — ROSUVASTATIN CALCIUM 5 MG PO TABS: 5.0000 mg | ORAL_TABLET | Freq: Every day | ORAL | 3 refills | Status: DC

## 2021-08-30 NOTE — Progress Notes (Signed)
Clinical Summary Kelly Hebert is a 81 y.o.female seen today as a new patient for the following medical problems. Last seen in our clinic in 2018.   1. Carotid stenosis - 03/2016 carotid US with LICA 54-56%, large plaque RICA without significant stenosis - 04/2016 CTA neck bulkly plaque LICA 25% stenosis, RICA 35%   - denies any neuro symptoms.  - reports pcp has been following carotid studies, done in [place].      2. Chest pain -atypical symptoms reported at our 2018 visit, no recent symptoms    3. HTN - did not take meds yet today    4. Hyperlipidemia 11/2020 TC 187 TG 127 HDL 48 LDL 115 - taking atorvastatin '40mg'$  every other day due to stomach upset. Ongoing symptoms despite med chagne. Reports she had stopepd taking at one point and symptoms did improve.  Past Medical History:  Diagnosis Date   Acid reflux    Anxiety    Arthritis    Asthma    Cancer ([place])    endometrial cancer   Constipation    COPD (chronic obstructive pulmonary disease) (HCC)    Depression    Gout    Heart murmur    Hx of pyloric stenosis 11/14/2011   Hypercholesteremia    Hyperglycemia    Hypertension    Hypothyroidism    Renal disorder    pelvic kidney   Retinitis pigmentosa    S/P colonoscopy 2003   Dr. Nicolasa Ducking: normal colon, normal path.    Stomach ulcer    Vitamin D deficiency disease      Allergies  Allergen Reactions   Ivp Dye [Iodinated Contrast Media] Hives, Itching and Other (See Comments)    Pt. States this happened 4 or 5 years ago in [place].     Nifedipine Nausea And Vomiting and Other (See Comments)    unknown     Current Outpatient Medications  Medication Sig Dispense Refill   acetaminophen (TYLENOL) 500 MG tablet Take 500 mg by mouth every 6 (six) hours as needed.     albuterol (PROVENTIL HFA;VENTOLIN HFA) 108 (90 BASE) MCG/ACT inhaler Inhale 2 puffs into the lungs every 6 (six) hours as needed for wheezing or shortness of breath.      amitriptyline  (ELAVIL) 10 MG tablet Take 10 mg by mouth at bedtime.      amLODipine (NORVASC) 10 MG tablet Take 10 mg by mouth daily.     aspirin EC 81 MG tablet Take 81 mg by mouth daily.     atorvastatin (LIPITOR) 40 MG tablet Take 40 mg by mouth every evening.   5   benazepril-hydrochlorthiazide (LOTENSIN HCT) 20-25 MG tablet Take 1 tablet by mouth daily.      buPROPion (WELLBUTRIN XL) 150 MG 24 hr tablet      cholecalciferol (VITAMIN D) 1000 units tablet Take 2,000 Units by mouth daily.  (Patient not taking: Reported on 08/04/2019)     citalopram (CELEXA) 40 MG tablet Take 40 mg by mouth daily.       DEXILANT 60 MG capsule TAKE 1 CAPSULE BY MOUTH EVERY DAY 30 capsule 5   dexlansoprazole (DEXILANT) 60 MG capsule TAKE 1 CAPSULE BY MOUTH EVERY DAY 30 capsule 2   fluticasone (FLOVENT HFA) 110 MCG/ACT inhaler Inhale 2 puffs into the lungs 2 (two) times daily as needed (for shortness of breath).      gabapentin (NEURONTIN) 300 MG capsule Take 300 mg by mouth at bedtime. (Patient not taking: Reported on  08/04/2019)     ibuprofen (ADVIL) 200 MG tablet Take 200 mg by mouth every 6 (six) hours as needed.     levothyroxine (SYNTHROID, LEVOTHROID) 50 MCG tablet Take 50 mcg by mouth daily before breakfast.      LINZESS 145 MCG CAPS capsule TAKE 1 CAPSULE BY MOUTH EVERY DAY (Patient taking differently: TAKE 1 CAPSULE BY MOUTH EVERY DAY AS NEEDED FOR CONSTIPATION) 90 capsule 3   LORazepam (ATIVAN) 1 MG tablet Take 1 mg by mouth 2 (two) times daily as needed for anxiety.      potassium chloride (KLOR-CON) 20 MEQ packet Take 20 mEq by mouth once.     predniSONE (STERAPRED UNI-PAK 21 TAB) 5 MG (21) TBPK tablet Take 5 mg by mouth daily.     Vitamin D, Ergocalciferol, (DRISDOL) 1.25 MG (50000 UT) CAPS capsule Take 1 capsule by mouth once a week.     No current facility-administered medications for this visit.     Past Surgical History:  Procedure Laterality Date   ABDOMINAL HYSTERECTOMY     complete   BREAST SURGERY      breast biopsy    COLONOSCOPY  03/09/2011   TOI:[id number] diverticula, small internal hemorrhoids.    ESOPHAGOGASTRODUODENOSCOPY   03/09/2011   DXI:[id number] in the distal esophagus/ Stenosis at the pylorus/ Mild gastritis/ Hiatal hernia. gastric bx showed gastritis but no H.pylori   ESOPHAGOGASTRODUODENOSCOPY  03/20/11   SLF: stricture distal esophgus and stenosis at pyloris likely from NSAIDs. s/p dialtion.    ESOPHAGOGASTRODUODENOSCOPY N/A 01/24/2017   Procedure: ESOPHAGOGASTRODUODENOSCOPY (EGD);  Surgeon: Danie Binder, MD;  [facility];  Service: Endoscopy;  Laterality: N/A;  10:45am   ESOPHAGOGASTRODUODENOSCOPY N/A 02/08/2017   Procedure: ESOPHAGOGASTRODUODENOSCOPY (EGD);  Surgeon: Danie Binder, MD;  [facility];  Service: Endoscopy;  Laterality: N/A;  12:45pm   ESOPHAGOGASTRODUODENOSCOPY N/A 04/12/2017   Procedure: ESOPHAGOGASTRODUODENOSCOPY (EGD);  Surgeon: Danie Binder, MD;  [facility];  Service: Endoscopy;  Laterality: N/A;  1:30pm   ESOPHAGOGASTRODUODENOSCOPY (EGD) WITH ESOPHAGEAL DILATION  11/23/2011   SLF: Stricture was found at the gastroesophageal junction/ Small hiatal hernia/  Sessile polyp was found in the gastric body and gastric fundus/ Stenosis was found at the pylorus/ The duodenal mucosa showed no abnormalities in the bulb and second portion of the duodenum   ESOPHAGOGASTRODUODENOSCOPY (EGD) WITH ESOPHAGEAL DILATION  12/03/2011   JQB:HALPF was found in the entire examined stomach/Stricture was found at the gastroesophageal junction   LAPAROSCOPY  2000   [place]-stomach pain-pt reports negative   ROTATOR CUFF REPAIR     SAVORY DILATION N/A 01/24/2017   Procedure: SAVORY DILATION;  Surgeon: Danie Binder, MD;  [facility];  Service: Endoscopy;  Laterality: N/A;   SAVORY DILATION N/A 02/08/2017   Procedure: SAVORY DILATION;  Surgeon: Danie Binder, MD;  [facility];  Service: Endoscopy;  Laterality: N/A;    SAVORY DILATION N/A 04/12/2017   Procedure: SAVORY DILATION;  Surgeon: Danie Binder, MD;  [facility];  Service: Endoscopy;  Laterality: N/A;     Allergies  Allergen Reactions   Ivp Dye [Iodinated Contrast Media] Hives, Itching and Other (See Comments)    Pt. States this happened 4 or 5 years ago in [place].     Nifedipine Nausea And Vomiting and Other (See Comments)    unknown      Family History  Problem Relation Age of Onset   Kidney disease Sister    Cirrhosis  Brother        ?etoh   Diabetes Brother    Heart attack Brother    Heart disease Mother    Heart attack Mother    Heart disease Father    Heart attack Father    Diabetes Brother    Cirrhosis Sister        Non-etoh   Colon cancer Neg Hx      Social History Ms. Evitt reports that she has never smoked. She has never used smokeless tobacco. Ms. Orsini reports no history of alcohol use.   Review of Systems CONSTITUTIONAL: No weight loss, fever, chills, weakness or fatigue.  HEENT: Eyes: No visual loss, blurred vision, double vision or yellow sclerae.No hearing loss, sneezing, congestion, runny nose or sore throat.  SKIN: No rash or itching.  CARDIOVASCULAR: per hpi RESPIRATORY: No shortness of breath, cough or sputum.  GASTROINTESTINAL: No anorexia, nausea, vomiting or diarrhea. No abdominal pain or blood.  GENITOURINARY: No burning on urination, no polyuria NEUROLOGICAL: No headache, dizziness, syncope, paralysis, ataxia, numbness or tingling in the extremities. No change in bowel or bladder control.  MUSCULOSKELETAL: No muscle, back pain, joint pain or stiffness.  LYMPHATICS: No enlarged nodes. No history of splenectomy.  PSYCHIATRIC: No history of depression or anxiety.  ENDOCRINOLOGIC: No reports of sweating, cold or heat intolerance. No polyuria or polydipsia.  Marland Kitchen   Physical Examination Today's Vitals   08/30/21 1039 08/30/21 1053  BP: (!) 156/66 138/70  Pulse: 82   SpO2: 98%    Weight: 138 lb 3.2 oz (62.7 kg)   Height: '5\' 2"'$  (1.575 m)    Body mass index is 25.28 kg/m.  Gen: resting comfortably, no acute distress HEENT: no scleral icterus, pupils equal round and reactive, no palptable cervical adenopathy,  CV: RRR, 2/6 systolic murmur rusb, no jvd Resp: Clear to auscultation bilaterally GI: abdomen is soft, non-tender, non-distended, normal bowel sounds, no hepatosplenomegaly MSK: extremities are warm, no edema.  Skin: warm, no rash Neuro:  no focal deficits Psych: appropriate affect   Diagnostic Studies  06/2016 echo Study Conclusions   - Left ventricle: The cavity size was normal. Systolic function was    normal. The estimated ejection fraction was in the range of 60%    to 65%. Wall motion was normal; there were no regional wall    motion abnormalities. Left ventricular diastolic function    parameters were normal.  - Aortic valve: Valve area (VTI): 1.78 cm^2. Valve area (Vmax):    1.61 cm^2. Valve area (Vmean): 1.71 cm^2.  - Left atrium: Some shadowing artifact in LA on subcostal images.  - Atrial septum: No defect or patent foramen ovale was identified.    Assessment and Plan  1. Carotid stenosis - moderate left and mild right ICA stenosis - request most recent carotid US from pcp - continue medical therapy.     2. HTN - above goal today, has not taken meds yet - call us Friday with home bp's. Room to titrate her lotensin HCT is needed   3. Hyperlipidemia - stomach upset on atorva, will change to crestor '5mg'$  daily.      EKG today SR, no ischemic changes    Arnoldo Lenis, M.D.

## 2021-08-30 NOTE — Patient Instructions (Addendum)
Medication Instructions:  our physician has recommended you make the following change in your medication:  Stop Lipitor Start Crestor 5 mg tablets daily   Labwork: None  Testing/Procedures: None  Follow-Up: Follow up with Dr. Harl Bowie in 6 months.   Any Other Special Instructions Will Be Listed Below (If Applicable).  Call us with your blood pressure on Friday.    If you need a refill on your cardiac medications before your next appointment, please call your pharmacy.

## 2021-09-21 ENCOUNTER — Encounter: Payer: Self-pay | Admitting: Internal Medicine

## 2021-09-21 ENCOUNTER — Ambulatory Visit (INDEPENDENT_AMBULATORY_CARE_PROVIDER_SITE_OTHER): Payer: 59 | Admitting: Internal Medicine

## 2021-09-21 ENCOUNTER — Telehealth: Payer: Self-pay | Admitting: *Deleted

## 2021-09-21 ENCOUNTER — Other Ambulatory Visit: Payer: Self-pay | Admitting: *Deleted

## 2021-09-21 VITALS — BP 158/75 | HR 75 | Temp 97.8°F | Ht 62.0 in | Wt 136.6 lb

## 2021-09-21 DIAGNOSIS — R1319 Other dysphagia: Secondary | ICD-10-CM | POA: Diagnosis not present

## 2021-09-21 DIAGNOSIS — K5904 Chronic idiopathic constipation: Secondary | ICD-10-CM

## 2021-09-21 DIAGNOSIS — R1031 Right lower quadrant pain: Secondary | ICD-10-CM

## 2021-09-21 DIAGNOSIS — K219 Gastro-esophageal reflux disease without esophagitis: Secondary | ICD-10-CM | POA: Diagnosis not present

## 2021-09-21 DIAGNOSIS — R109 Unspecified abdominal pain: Secondary | ICD-10-CM

## 2021-09-21 MED ORDER — DIPHENHYDRAMINE HCL 50 MG PO TABS: 50.0000 mg | ORAL_TABLET | Freq: Once | ORAL | 0 refills | Status: DC

## 2021-09-21 MED ORDER — PREDNISONE 50 MG PO TABS: ORAL_TABLET | ORAL | 0 refills | Status: DC

## 2021-09-21 NOTE — Progress Notes (Signed)
Primary Care Physician:  [facility] Gastroenterologist:  Dr. Abbey Chatters  Chief Complaint  Patient presents with   Colonoscopy    Last colonoscopy was 2013   Dysphagia    States pills get stuck in the middle   Abdominal Pain    Lower abdominal pain   Gastroesophageal Reflux    Takes medicine, works most of the time    HPI:   Kelly Hebert is a 81 y.o. female who presents with multiple GI complaints.  Last seen over 3 years ago.  Has chronic reflux for which she takes Dexilant 60 mg daily.  History of dysphagia in the past status post esophageal dilation due to esophageal stricture March 2019.  States this has progressively worsened from a dysphagia standpoint.  Notes food and pills getting stuck in her substernal region.  Reflux is relatively well controlled on Dexilant.  No epigastric or chest pain.  Sometimes regurgitation occurs.  Also with chronic constipation.  Currently maintained on Linzess 145 mcg daily.  Feels as though this helps some though still has issues with constipation.  Main complaint for me today is right lower quadrant abdominal pain.  This is constant, dull, sometimes moderately severe.  Does not radiate.  No melena hematochezia.  Patient is concerned about diverticulitis.    Past Medical History:  Diagnosis Date   Acid reflux    Anxiety    Arthritis    Asthma    Cancer ([place])    endometrial cancer   Constipation    COPD (chronic obstructive pulmonary disease) (HCC)    Depression    Gout    Heart murmur    Hx of pyloric stenosis 11/14/2011   Hypercholesteremia    Hyperglycemia    Hypertension    Hypothyroidism    Renal disorder    pelvic kidney   Retinitis pigmentosa    S/P colonoscopy 2003   Dr. Nicolasa Ducking: normal colon, normal path.    Stomach ulcer    Vitamin D deficiency disease     Past Surgical History:  Procedure Laterality Date   ABDOMINAL HYSTERECTOMY     complete   BREAST SURGERY     breast biopsy     COLONOSCOPY  03/09/2011   BMW:[id number] diverticula, small internal hemorrhoids.    ESOPHAGOGASTRODUODENOSCOPY   03/09/2011   NUU:[id number] in the distal esophagus/ Stenosis at the pylorus/ Mild gastritis/ Hiatal hernia. gastric bx showed gastritis but no H.pylori   ESOPHAGOGASTRODUODENOSCOPY  03/20/11   SLF: stricture distal esophgus and stenosis at pyloris likely from NSAIDs. s/p dialtion.    ESOPHAGOGASTRODUODENOSCOPY N/A 01/24/2017   Procedure: ESOPHAGOGASTRODUODENOSCOPY (EGD);  Surgeon: Danie Binder, MD;  [facility];  Service: Endoscopy;  Laterality: N/A;  10:45am   ESOPHAGOGASTRODUODENOSCOPY N/A 02/08/2017   Procedure: ESOPHAGOGASTRODUODENOSCOPY (EGD);  Surgeon: Danie Binder, MD;  [facility];  Service: Endoscopy;  Laterality: N/A;  12:45pm   ESOPHAGOGASTRODUODENOSCOPY N/A 04/12/2017   Procedure: ESOPHAGOGASTRODUODENOSCOPY (EGD);  Surgeon: Danie Binder, MD;  [facility];  Service: Endoscopy;  Laterality: N/A;  1:30pm   ESOPHAGOGASTRODUODENOSCOPY (EGD) WITH ESOPHAGEAL DILATION  11/23/2011   SLF: Stricture was found at the gastroesophageal junction/ Small hiatal hernia/  Sessile polyp was found in the gastric body and gastric fundus/ Stenosis was found at the pylorus/ The duodenal mucosa showed no abnormalities in the bulb and second portion of the duodenum   ESOPHAGOGASTRODUODENOSCOPY (EGD) WITH ESOPHAGEAL DILATION  12/03/2011   HKV:QQVZD was found in  the entire examined stomach/Stricture was found at the gastroesophageal junction   LAPAROSCOPY  2000   [place]-stomach pain-pt reports negative   ROTATOR CUFF REPAIR     SAVORY DILATION N/A 01/24/2017   Procedure: SAVORY DILATION;  Surgeon: Danie Binder, MD;  [facility];  Service: Endoscopy;  Laterality: N/A;   SAVORY DILATION N/A 02/08/2017   Procedure: SAVORY DILATION;  Surgeon: Danie Binder, MD;  [facility];  Service: Endoscopy;  Laterality: N/A;   SAVORY DILATION N/A  04/12/2017   Procedure: SAVORY DILATION;  Surgeon: Danie Binder, MD;  [facility];  Service: Endoscopy;  Laterality: N/A;    Current Outpatient Medications  Medication Sig Dispense Refill   albuterol (PROVENTIL HFA;VENTOLIN HFA) 108 (90 BASE) MCG/ACT inhaler Inhale 2 puffs into the lungs every 6 (six) hours as needed for wheezing or shortness of breath.      amitriptyline (ELAVIL) 10 MG tablet Take 10 mg by mouth at bedtime.      amLODipine (NORVASC) 10 MG tablet Take 10 mg by mouth daily.     aspirin EC 81 MG tablet Take 81 mg by mouth daily.     azelastine (ASTELIN) 0.1 % nasal spray Place 1 spray into both nostrils 2 (two) times daily. Use in each nostril as directed     benazepril-hydrochlorthiazide (LOTENSIN HCT) 20-25 MG tablet Take 1 tablet by mouth daily.      buPROPion (WELLBUTRIN XL) 150 MG 24 hr tablet      cetirizine (ZYRTEC) 10 MG tablet Take 10 mg by mouth daily.     citalopram (CELEXA) 40 MG tablet Take 40 mg by mouth daily.       DEXILANT 60 MG capsule TAKE 1 CAPSULE BY MOUTH EVERY DAY 30 capsule 5   fluticasone (FLONASE) 50 MCG/ACT nasal spray Place 1 spray into both nostrils 2 (two) times daily.     fluticasone (FLOVENT HFA) 110 MCG/ACT inhaler Inhale 2 puffs into the lungs 2 (two) times daily as needed (for shortness of breath).      levothyroxine (SYNTHROID, LEVOTHROID) 50 MCG tablet Take 50 mcg by mouth daily before breakfast.      LINZESS 145 MCG CAPS capsule TAKE 1 CAPSULE BY MOUTH EVERY DAY 90 capsule 3   LORazepam (ATIVAN) 1 MG tablet Take 1 mg by mouth 2 (two) times daily as needed for anxiety.      montelukast (SINGULAIR) 10 MG tablet Take 10 mg by mouth daily.     potassium chloride (KLOR-CON) 20 MEQ packet Take 20 mEq by mouth daily.     pravastatin (PRAVACHOL) 20 MG tablet Take 20 mg by mouth daily.     No current facility-administered medications for this visit.    Allergies as of 09/21/2021 - Review Complete 09/21/2021  Allergen Reaction  Noted   Ivp dye [iodinated contrast media] Hives, Itching, and Other (See Comments) 01/06/2011   Nifedipine Nausea And Vomiting and Other (See Comments) 03/02/2014   Sulfa antibiotics Rash 02/15/2020    Family History  Problem Relation Age of Onset   Kidney disease Sister    Cirrhosis Brother        ?etoh   Diabetes Brother    Heart attack Brother    Heart disease Mother    Heart attack Mother    Heart disease Father    Heart attack Father    Diabetes Brother    Cirrhosis Sister        Non-etoh   Colon cancer  Neg Hx     Social History   Socioeconomic History   Marital status: Legally Separated    Spouse name: Not on file   Number of children: 3   Years of education: Not on file   Highest education level: Not on file  Occupational History   Occupation: retired [employer] fundraiser  Tobacco Use   Smoking status: Never   Smokeless tobacco: Never  Vaping Use   Vaping Use: Never used  Substance and Sexual Activity   Alcohol use: No   Drug use: No   Sexual activity: Not Currently  Other Topics Concern   Not on file  Social History Narrative   Not on file   Social Determinants of Health   Financial Resource Strain: Not on file  Food Insecurity: Not on file  Transportation Needs: Not on file  Physical Activity: Not on file  Stress: Not on file  Social Connections: Not on file  Intimate Partner Violence: Not on file    Subjective: Review of Systems  Constitutional:  Negative for chills and fever.  HENT:  Negative for congestion and hearing loss.   Eyes:  Negative for blurred vision and double vision.  Respiratory:  Negative for cough and shortness of breath.   Cardiovascular:  Negative for chest pain and palpitations.  Gastrointestinal:  Positive for abdominal pain and heartburn. Negative for blood in stool, constipation, diarrhea, melena and vomiting.       Dysphagia   Genitourinary:  Negative for dysuria and urgency.  Musculoskeletal:  Negative for joint pain and  myalgias.  Skin:  Negative for itching and rash.  Neurological:  Negative for dizziness and headaches.  Psychiatric/Behavioral:  Negative for depression. The patient is not nervous/anxious.        Objective: BP (!) 158/75 (BP Location: Left Arm, Patient Position: Sitting, Cuff Size: Normal)   Pulse 75   Temp 97.8 F (36.6 C) (Temporal)   Ht '5\' 2"'$  (1.575 m)   Wt 136 lb 9.6 oz (62 kg)   SpO2 98%   BMI 24.98 kg/m  Physical Exam Constitutional:      Appearance: Normal appearance.  HENT:     Head: Normocephalic and atraumatic.  Eyes:     Extraocular Movements: Extraocular movements intact.     Conjunctiva/sclera: Conjunctivae normal.  Cardiovascular:     Rate and Rhythm: Normal rate and regular rhythm.  Pulmonary:     Effort: Pulmonary effort is normal.     Breath sounds: Normal breath sounds.  Abdominal:     General: Bowel sounds are normal.     Palpations: Abdomen is soft.     Tenderness: There is abdominal tenderness in the right lower quadrant.  Musculoskeletal:        General: No swelling. Normal range of motion.     Cervical back: Normal range of motion and neck supple.  Skin:    General: Skin is warm and dry.     Coloration: Skin is not jaundiced.  Neurological:     General: No focal deficit present.     Mental Status: She is alert and oriented to person, place, and time.  Psychiatric:        Mood and Affect: Mood normal.        Behavior: Behavior normal.      Assessment: *Chronic GERD *Esophageal dysphagia-worsening *Abdominal pain-moderate severity *Chronic idiopathic constipation  Plan: Discussed patient's GERD and worsening dysphagia.  She certainly warrants further investigation with EGD with possible dilation.  That being said her more  pressing issue is new onset right lower quadrant abdominal pain.  She is quite tender on palpation this morning.  We will order CT abdomen pelvis with IV and oral contrast.  Patient with IV contrast allergy.  Will  dose with prednisone and Benadryl per protocol prior.  Samples of Linzess 290 mcg given to patient today.  She will try these for a week and see how she does.  If improved, I will send a new prescription with higher dose.  Otherwise stay on 145 mcg daily.  Pending endoscopic evaluation, she may require colonoscopy so we will hold off on scheduling EGD at this time.  If CT unremarkable, then will schedule for EGD with dilation.  Follow-up in 4 months.  09/21/2021 11:56 AM   Disclaimer: This note was dictated with voice recognition software. Similar sounding words can inadvertently be transcribed and may not be corrected upon review.

## 2021-09-21 NOTE — Telephone Encounter (Signed)
Spoke with pt and daughter. Patient is willing to try the pre med for CT with IV contrast.  Daughter aware of appt details. Also made aware of prednisone/benadryl instructions. She voiced understanding. Also aware to make sure to pick prep up from [facility] next week before CT at [facility].

## 2021-09-21 NOTE — Patient Instructions (Signed)
I am going to order a CAT scan of your abdomen pelvis to further evaluate your abdominal pain.  We will call you with these results.  I think you need a repeat upper endoscopy with possible esophageal dilation.  We will hold off until we get your CAT scan results first as you may need a colonoscopy performed at the same time.  I will give you samples of Linzess 290 mcg daily for your chronic constipation.  Let us know how this dose is does.  If better than current dose, then I will send in a new prescription.  Continue on Dexilant for your chronic reflux.  It was very nice meeting both you today.  Dr. Abbey Chatters

## 2021-09-30 ENCOUNTER — Ambulatory Visit (HOSPITAL_BASED_OUTPATIENT_CLINIC_OR_DEPARTMENT_OTHER)
Admission: RE | Admit: 2021-09-30 | Discharge: 2021-09-30 | Disposition: A | Discharge status: Home / Self Care | Payer: 59 | Source: Ambulatory Visit | Attending: Internal Medicine | Admitting: Internal Medicine

## 2021-09-30 DIAGNOSIS — R109 Unspecified abdominal pain: Secondary | ICD-10-CM | POA: Diagnosis present

## 2021-09-30 LAB — POCT I-STAT CREATININE: Creatinine, Ser: 1.1 mg/dL — ABNORMAL HIGH (ref 0.44–1.00)

## 2021-09-30 MED ORDER — IOHEXOL 300 MG/ML  SOLN
100.0000 mL | Freq: Once | INTRAMUSCULAR | Status: AC | PRN
  Administered 2021-09-30: 80 mL via INTRAVENOUS

## 2021-10-02 ENCOUNTER — Telehealth: Payer: Self-pay

## 2021-10-02 NOTE — Telephone Encounter (Signed)
Pt called wanting to know the results to her recent ct scan.

## 2021-10-18 ENCOUNTER — Telehealth: Payer: Self-pay | Admitting: *Deleted

## 2021-10-18 NOTE — Telephone Encounter (Signed)
Spoke with daughter Lorriane Shire. She stated pt is having abd discomfort and not getting better. Advised her of the CT results. Stated she called and could not getting anyone to call her back to give her the CT results as pt did not remember the results. The daughter thinks the pain is possible coming from the cyst and wants to know what can be done? Patient complained all weekend/week of abd discomfort.

## 2021-10-19 NOTE — Telephone Encounter (Signed)
Phoned the pt's daughter Lorriane Shire @ [phone] and LMOVM for her to call regarding her mother's result

## 2021-10-19 NOTE — Telephone Encounter (Signed)
noted 

## 2021-10-19 NOTE — Telephone Encounter (Signed)
I called Kelly Hebert and she is aware of OV for her mother on Tuesday 10/3 at 1 pm with CM

## 2021-10-19 NOTE — Telephone Encounter (Signed)
Renal cysts rarely cause pain unless they get quite large.  Also if I recall, patient's abdominal pain is lower quadrant not true flank pain.  I do not know what is causing patient's pain.  Are her bowels moving better on Linzess 290 mcg?  Next step evaluation wise would be to perform colonoscopy to further evaluate.  We could potentially do EGD at the same time given her history of dysphagia though I believe this has improved.  If they would like to discuss further, please schedule office visit with either myself or one of the apps.  Thank you

## 2021-10-19 NOTE — Telephone Encounter (Signed)
Dr Abbey Chatters, FYI:  Phoned and spoke with the pt's daughter Lorriane Shire [phone]) advised of the note. The Linzess 290 mcg is helping the pt to have BM's. The pt and her daughter was transferred up front to schedule appt to discuss further.

## 2021-10-24 ENCOUNTER — Encounter: Payer: Self-pay | Admitting: Gastroenterology

## 2021-10-24 ENCOUNTER — Ambulatory Visit (INDEPENDENT_AMBULATORY_CARE_PROVIDER_SITE_OTHER): Payer: 59 | Admitting: Gastroenterology

## 2021-10-24 VITALS — BP 165/74 | HR 80 | Temp 98.0°F | Ht 62.0 in | Wt 136.8 lb

## 2021-10-24 DIAGNOSIS — R109 Unspecified abdominal pain: Secondary | ICD-10-CM

## 2021-10-24 DIAGNOSIS — K219 Gastro-esophageal reflux disease without esophagitis: Secondary | ICD-10-CM

## 2021-10-24 DIAGNOSIS — K5904 Chronic idiopathic constipation: Secondary | ICD-10-CM | POA: Diagnosis not present

## 2021-10-24 DIAGNOSIS — N281 Cyst of kidney, acquired: Secondary | ICD-10-CM

## 2021-10-24 DIAGNOSIS — R1319 Other dysphagia: Secondary | ICD-10-CM | POA: Diagnosis not present

## 2021-10-24 MED ORDER — LINACLOTIDE 290 MCG PO CAPS: 290.0000 ug | ORAL_CAPSULE | Freq: Every day | ORAL | 3 refills | Status: DC

## 2021-10-24 NOTE — Patient Instructions (Signed)
I want you to continue taking Linzess 290 mcg every other day.  If you would like to you may supplement on the days in between with MiraLAX 17 g (1 capful).  For your increased gas.  You may take over-the-counter Gas-X, Beano, or other anti-gas medication with active ingredient (simethicone).  You could also try something like Phazyme. Avoid gas-producing foods (eg, cabbage, legumes, onions, broccoli, brussel sprouts, wheat, and potatoes).    We will get you scheduled for an upper endoscopy with dilation and colonoscopy in the near future.  It was a pleasure to see you today. I want to create trusting relationships with patients. If you receive a survey regarding your visit,  I greatly appreciate you taking time to fill this out on paper or through your MyChart. I value your feedback.  Venetia Night, MSN, FNP-BC, AGACNP-BC [facility]

## 2021-10-24 NOTE — Progress Notes (Unsigned)
GI Office Note    Referring Provider: [facility] Primary Care Physician:  [facility] [place] [facility] Gastroenterologist: Dr. Abbey Chatters  Date:  10/24/2021  ID:  Kelly Hebert, DOB August 02, 1940, MRN [id number]   Chief Complaint   Chief Complaint  Patient presents with   Abdominal Pain    Lower abdominal pain. Wants to go over CT scan.     History of Present Illness  Kelly Hebert is a 81 y.o. female with a history of anxiety, depression, asthma, endometrial cancer, GERD, COPD, hypothyroidism, HLD, and vitamin D deficiency presenting today with complaint of ongoing abdominal pain.  Last office visit 09/21/2021 with Dr. Abbey Chatters.  Complaint of dysphagia, lower abdominal pain, and reflux.  No history of chronic GERD, maintained on Dexilant 60 mg daily.  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  Reported food and pills getting stuck in her substernal region.  Also with chronic constipation.  Was on Linzess 145 mcg daily and felt that it was helpful but was still having some issues with constipation.  Her main complaint was right lower quadrant abdominal pain that was constant but dull with intermittent moderate severity.  Denied any melena or hematochezia.  Patient was concerned about diverticulitis.  She requested further evaluation of dysphagia with EGD and possible dilation.  She was tender on exam to her right lower quadrant.  CT abdomen pelvis was ordered.  She was provided samples of Linzess to 90 mcg daily.  CT abdomen pelvis performed 09/30/2021 with no acute abnormality in the abdomen or pelvis.  Did have evidence of right renal cyst and recommended follow-up imaging in 6 to 12 months (MRI preferred over CT).   Per telephone note on 10/19/2021 it was noted that patient's bowel movements were improved on Linzess to 90 mcg daily but she continued to have lower abdominal discomfort that was not  improving and Dr. Ave Filter recommendations were to perform colonoscopy for further evaluation at time of EGD.  Today: Yesterday she had some left lower quadrant pain. Previously was having some right lower pain. Sometimes has pain all the way across. Pain can happen at anytime. Sometimes she has pain when trying to clean and bending over a lot doing cleaning. Pain does last for a long time and has been going on for years. Sometimes the pain is sharp like a pinching pain. Sometimes she can go a day or so and do well.   Taking Linzess 290 mcg but when taking daily she was having to much diarrhea so she has been taking it every other day but sill having discomfort in between. Does have a lot of gas as well. Has not tried any over the counter for it.  No melena, BRBPR, lack of appetite. States she has lost about 7 lbs in the last 4 months or so. Patients husband is having some medical difficulties as well.   Swallowing is getting better with pills and solids.    Current Outpatient Medications  Medication Sig Dispense Refill   albuterol (PROVENTIL HFA;VENTOLIN HFA) 108 (90 BASE) MCG/ACT inhaler Inhale 2 puffs into the lungs every 6 (six) hours as needed for wheezing or shortness of breath.      amitriptyline (ELAVIL) 10 MG tablet Take 10 mg by mouth at bedtime.      amLODipine (NORVASC) 10 MG tablet Take 10 mg by mouth daily.     aspirin EC 81 MG tablet  Take 81 mg by mouth daily.     azelastine (ASTELIN) 0.1 % nasal spray Place 1 spray into both nostrils 2 (two) times daily. Use in each nostril as directed     benazepril-hydrochlorthiazide (LOTENSIN HCT) 20-25 MG tablet Take 1 tablet by mouth daily.      buPROPion (WELLBUTRIN XL) 150 MG 24 hr tablet      cetirizine (ZYRTEC) 10 MG tablet Take 10 mg by mouth daily.     citalopram (CELEXA) 40 MG tablet Take 40 mg by mouth daily.       DEXILANT 60 MG capsule TAKE 1 CAPSULE BY MOUTH EVERY DAY 30 capsule 5   fluticasone (FLONASE) 50 MCG/ACT nasal spray  Place 1 spray into both nostrils 2 (two) times daily.     fluticasone (FLOVENT HFA) 110 MCG/ACT inhaler Inhale 2 puffs into the lungs 2 (two) times daily as needed (for shortness of breath).      levothyroxine (SYNTHROID, LEVOTHROID) 50 MCG tablet Take 50 mcg by mouth daily before breakfast.      LINZESS 145 MCG CAPS capsule TAKE 1 CAPSULE BY MOUTH EVERY DAY 90 capsule 3   LORazepam (ATIVAN) 1 MG tablet Take 1 mg by mouth 2 (two) times daily as needed for anxiety.      montelukast (SINGULAIR) 10 MG tablet Take 10 mg by mouth daily.     potassium chloride (KLOR-CON) 20 MEQ packet Take 20 mEq by mouth daily.     pravastatin (PRAVACHOL) 20 MG tablet Take 20 mg by mouth daily.     predniSONE (DELTASONE) 50 MG tablet Take 1 tablet 13 hours, 7 hours, 1 hour prior to study 3 tablet 0   diphenhydrAMINE (BENADRYL) 50 MG tablet Take 1 tablet (50 mg total) by mouth once for 1 dose. Take 1 hour prior to study 1 tablet 0   No current facility-administered medications for this visit.    Past Medical History:  Diagnosis Date   Acid reflux    Anxiety    Arthritis    Asthma    Cancer ([place])    endometrial cancer   Constipation    COPD (chronic obstructive pulmonary disease) (HCC)    Depression    Gout    Heart murmur    Hx of pyloric stenosis 11/14/2011   Hypercholesteremia    Hyperglycemia    Hypertension    Hypothyroidism    Renal disorder    pelvic kidney   Retinitis pigmentosa    S/P colonoscopy 2003   Dr. Nicolasa Ducking: normal colon, normal path.    Stomach ulcer    Vitamin D deficiency disease     Past Surgical History:  Procedure Laterality Date   ABDOMINAL HYSTERECTOMY     complete   BREAST SURGERY     breast biopsy    COLONOSCOPY  03/09/2011   OZH:YQMVHQI diverticula, small internal hemorrhoids.    ESOPHAGOGASTRODUODENOSCOPY   03/09/2011   ONG:EXBMWUXLK in the distal esophagus/ Stenosis at the pylorus/ Mild gastritis/ Hiatal hernia. gastric bx showed gastritis but no H.pylori    ESOPHAGOGASTRODUODENOSCOPY  03/20/11   SLF: stricture distal esophgus and stenosis at pyloris likely from NSAIDs. s/p dialtion.    ESOPHAGOGASTRODUODENOSCOPY N/A 01/24/2017   Procedure: ESOPHAGOGASTRODUODENOSCOPY (EGD);  Surgeon: Danie Binder, MD;  [facility];  Service: Endoscopy;  Laterality: N/A;  10:45am   ESOPHAGOGASTRODUODENOSCOPY N/A 02/08/2017   Procedure: ESOPHAGOGASTRODUODENOSCOPY (EGD);  Surgeon: Danie Binder, MD;  [facility];  Service: Endoscopy;  Laterality: N/A;  12:45pm  ESOPHAGOGASTRODUODENOSCOPY N/A 04/12/2017   Procedure: ESOPHAGOGASTRODUODENOSCOPY (EGD);  Surgeon: Danie Binder, MD;  [facility];  Service: Endoscopy;  Laterality: N/A;  1:30pm   ESOPHAGOGASTRODUODENOSCOPY (EGD) WITH ESOPHAGEAL DILATION  11/23/2011   SLF: Stricture was found at the gastroesophageal junction/ Small hiatal hernia/  Sessile polyp was found in the gastric body and gastric fundus/ Stenosis was found at the pylorus/ The duodenal mucosa showed no abnormalities in the bulb and second portion of the duodenum   ESOPHAGOGASTRODUODENOSCOPY (EGD) WITH ESOPHAGEAL DILATION  12/03/2011   XHB:ZJIRC was found in the entire examined stomach/Stricture was found at the gastroesophageal junction   LAPAROSCOPY  2000   Waldorf-stomach pain-pt reports negative   ROTATOR CUFF REPAIR     SAVORY DILATION N/A 01/24/2017   Procedure: SAVORY DILATION;  Surgeon: Danie Binder, MD;  [facility];  Service: Endoscopy;  Laterality: N/A;   SAVORY DILATION N/A 02/08/2017   Procedure: SAVORY DILATION;  Surgeon: Danie Binder, MD;  [facility];  Service: Endoscopy;  Laterality: N/A;   SAVORY DILATION N/A 04/12/2017   Procedure: SAVORY DILATION;  Surgeon: Danie Binder, MD;  [facility];  Service: Endoscopy;  Laterality: N/A;    Family History  Problem Relation Age of Onset   Kidney disease Sister    Cirrhosis Brother        ?etoh   Diabetes Brother     Heart attack Brother    Heart disease Mother    Heart attack Mother    Heart disease Father    Heart attack Father    Diabetes Brother    Cirrhosis Sister        Non-etoh   Colon cancer Neg Hx     Allergies as of 10/24/2021 - Review Complete 10/24/2021  Allergen Reaction Noted   Ivp dye [iodinated contrast media] Hives, Itching, and Other (See Comments) 01/06/2011   Nifedipine Nausea And Vomiting and Other (See Comments) 03/02/2014   Sulfa antibiotics Rash 02/15/2020    Social History   Socioeconomic History   Marital status: Legally Separated    Spouse name: Not on file   Number of children: 3   Years of education: Not on file   Highest education level: Not on file  Occupational History   Occupation: retired [employer] fundraiser  Tobacco Use   Smoking status: Never   Smokeless tobacco: Never  Vaping Use   Vaping Use: Never used  Substance and Sexual Activity   Alcohol use: No   Drug use: No   Sexual activity: Not Currently  Other Topics Concern   Not on file  Social History Narrative   Not on file   Social Determinants of Health   Financial Resource Strain: Not on file  Food Insecurity: Not on file  Transportation Needs: Not on file  Physical Activity: Not on file  Stress: Not on file  Social Connections: Not on file     Review of Systems   Gen: Denies fever, chills, anorexia. Denies fatigue, weakness, weight loss.  CV: Denies chest pain, palpitations, syncope, peripheral edema, and claudication. Resp: Denies dyspnea at rest, cough, wheezing, coughing up blood, and pleurisy. GI: See HPI Derm: Denies rash, itching, dry skin Psych: Denies depression, anxiety, memory loss, confusion. No homicidal or suicidal ideation.  Heme: Denies bruising, bleeding, and enlarged lymph nodes.   Physical Exam   BP (!) 165/74 (BP Location: Left Arm, Patient Position: Sitting, Cuff Size: Normal)   Pulse 80   Temp 98  F (36.7 C) (Temporal)   Ht '5\' 2"'$  (1.575 m)   Wt 136 lb  12.8 oz (62.1 kg)   SpO2 97%   BMI 25.02 kg/m   General:   Alert and oriented. No distress noted. Pleasant and cooperative.  Head:  Normocephalic and atraumatic. Eyes:  Conjuctiva clear without scleral icterus. Mouth:  Oral mucosa pink and moist. Good dentition. No lesions. Lungs:  Clear to auscultation bilaterally. No wheezes, rales, or rhonchi. No distress.  Heart:  S1, S2 present without murmurs appreciated.  Abdomen:  +BS, soft, non-tender and non-distended. No rebound or guarding. No HSM or masses noted. Rectal: *** Msk:  Symmetrical without gross deformities. Normal posture. Extremities:  Without edema. Neurologic:  Alert and  oriented x4 Psych:  Alert and cooperative. Normal mood and affect.   Assessment  Kelly Hebert is a 81 y.o. female with a history of anxiety, depression, asthma, endometrial cancer, GERD, COPD, hypothyroidism, HLD, and vitamin D deficiency*** presenting today with complaint of ongoing abdominal pain.  GERD: Well controlled on Dexilant 60 mg daily.  Dysphagia:   Abdominal pain, lower: Recent CT abdomen pelvis with IV and oral contrast with no acute abnormality in the abdomen other than right renal cyst. Results reviewed with patient and her daughter.   Chronic idiopathic constipation: Recently started on Linzess 290 mcg daily. ***   PLAN   *** Continue Linzess 290 mcg every other day.  Continue Dexilant 60 mg daily Over the counter simethicone as needed twice daily.  Miralax 17 g every other day as needed.  Proceed with upper endoscopy with dilation and colonoscopy with propofol by Dr. Abbey Chatters  in near future: the risks, benefits, and alternatives have been discussed with the patient in detail. The patient states understanding and desires to proceed. ASA 3 Consider further imaging of renal cyst in 6-12 months.  Follow-up post procedures with Dr. Abbey Chatters.     Venetia Night, MSN, FNP-BC, AGACNP-BC [facility]

## 2021-10-26 ENCOUNTER — Encounter: Payer: Self-pay | Admitting: *Deleted

## 2021-10-26 ENCOUNTER — Telehealth: Payer: Self-pay | Admitting: *Deleted

## 2021-10-26 NOTE — Telephone Encounter (Signed)
LMTRC to schedule TCS/EGD/ED ASA 3 with Dr, Elisha Ponder

## 2021-10-26 NOTE — Telephone Encounter (Signed)
Pt scheduled 11/20/21 at 10:45 am, Miralax prep instructions and pre-op date will be mailed to pt.

## 2021-10-26 NOTE — Telephone Encounter (Signed)
Spoke to pt's daughter Lorriane Shire [facility]) informed her of recommendations. She voiced understanding.

## 2021-10-26 NOTE — Telephone Encounter (Signed)
Pt's daughter Lorriane Shire [facility]) called and states her mother had a lot of pain in her left side yesterday and this morning. Would like to know if there is anything she could take for pain. Please advises.

## 2021-10-27 ENCOUNTER — Encounter: Payer: Self-pay | Admitting: *Deleted

## 2021-11-14 ENCOUNTER — Telehealth: Payer: Self-pay | Admitting: *Deleted

## 2021-11-14 NOTE — Telephone Encounter (Signed)
Pt's daughter Lorriane Shire called to cancel pt's procedures  and pre-op due to pt having pneumonia. She will call back to reschedule once pt is feeling well enough.  Procedures cancelled.

## 2021-11-16 ENCOUNTER — Encounter (HOSPITAL_COMMUNITY): Payer: 59

## 2021-11-20 ENCOUNTER — Ambulatory Visit (HOSPITAL_COMMUNITY): Admit: 2021-11-20 | Payer: Medicare Other

## 2021-11-20 ENCOUNTER — Encounter (HOSPITAL_COMMUNITY): Payer: Self-pay

## 2021-11-20 SURGERY — COLONOSCOPY WITH PROPOFOL
Anesthesia: Monitor Anesthesia Care

## 2021-11-27 ENCOUNTER — Encounter: Payer: Self-pay | Admitting: *Deleted

## 2021-11-27 MED ORDER — PEG 3350-KCL-NA BICARB-NACL 420 G PO SOLR: 4000.0000 mL | Freq: Once | ORAL | 0 refills | Status: AC

## 2021-11-27 NOTE — Telephone Encounter (Signed)
Pt has been rescheduled for 01/12/22 at 12:30 pm. New instructions and pre-op date mailed to pt. Prep sent to pharmacy per pt's request. She also requested an afternoon appt so her daughter could take her.

## 2021-11-27 NOTE — Telephone Encounter (Signed)
[employer] Medicare: APPROVED Authorization #: [id number]  DOS:01/12/22-04/12/22

## 2021-11-28 ENCOUNTER — Encounter: Payer: Self-pay | Admitting: *Deleted

## 2021-12-25 ENCOUNTER — Other Ambulatory Visit: Payer: Self-pay | Admitting: *Deleted

## 2021-12-25 MED ORDER — PEG 3350-KCL-NA BICARB-NACL 420 G PO SOLR
4000.0000 mL | Freq: Once | ORAL | 0 refills | Status: AC
Start: 2021-12-25 — End: 2021-12-25

## 2022-01-10 ENCOUNTER — Encounter (HOSPITAL_COMMUNITY)
Admission: RE | Admit: 2022-01-10 | Discharge: 2022-01-10 | Disposition: A | Discharge status: Home / Self Care | Payer: 59 | Source: Ambulatory Visit | Attending: Internal Medicine | Admitting: Internal Medicine

## 2022-01-10 ENCOUNTER — Other Ambulatory Visit: Payer: Self-pay

## 2022-01-10 ENCOUNTER — Encounter (HOSPITAL_COMMUNITY): Payer: Self-pay

## 2022-01-12 ENCOUNTER — Ambulatory Visit (HOSPITAL_BASED_OUTPATIENT_CLINIC_OR_DEPARTMENT_OTHER): Payer: 59 | Admitting: Anesthesiology

## 2022-01-12 ENCOUNTER — Ambulatory Visit (HOSPITAL_COMMUNITY): Payer: 59 | Admitting: Anesthesiology

## 2022-01-12 ENCOUNTER — Encounter (HOSPITAL_COMMUNITY): Payer: Self-pay

## 2022-01-12 ENCOUNTER — Ambulatory Visit (HOSPITAL_COMMUNITY)
Admission: RE | Admit: 2022-01-12 | Discharge: 2022-01-12 | Disposition: A | Discharge status: Home / Self Care | Payer: 59 | Source: Ambulatory Visit | Attending: Internal Medicine | Admitting: Internal Medicine

## 2022-01-12 ENCOUNTER — Encounter (HOSPITAL_COMMUNITY)
Admission: RE | Disposition: A | Discharge status: Home / Self Care | Payer: Self-pay | Source: Ambulatory Visit | Attending: Internal Medicine

## 2022-01-12 DIAGNOSIS — K5730 Diverticulosis of large intestine without perforation or abscess without bleeding: Secondary | ICD-10-CM | POA: Diagnosis not present

## 2022-01-12 DIAGNOSIS — R12 Heartburn: Secondary | ICD-10-CM | POA: Diagnosis present

## 2022-01-12 DIAGNOSIS — K209 Esophagitis, unspecified without bleeding: Secondary | ICD-10-CM

## 2022-01-12 DIAGNOSIS — K317 Polyp of stomach and duodenum: Secondary | ICD-10-CM

## 2022-01-12 DIAGNOSIS — D123 Benign neoplasm of transverse colon: Secondary | ICD-10-CM

## 2022-01-12 DIAGNOSIS — I10 Essential (primary) hypertension: Secondary | ICD-10-CM | POA: Diagnosis not present

## 2022-01-12 DIAGNOSIS — K222 Esophageal obstruction: Secondary | ICD-10-CM

## 2022-01-12 DIAGNOSIS — K2100 Gastro-esophageal reflux disease with esophagitis, without bleeding: Secondary | ICD-10-CM | POA: Insufficient documentation

## 2022-01-12 DIAGNOSIS — E039 Hypothyroidism, unspecified: Secondary | ICD-10-CM | POA: Insufficient documentation

## 2022-01-12 HISTORY — PX: POLYPECTOMY: SHX5525

## 2022-01-12 HISTORY — PX: ESOPHAGOGASTRODUODENOSCOPY (EGD) WITH PROPOFOL: SHX5813

## 2022-01-12 HISTORY — PX: COLONOSCOPY WITH PROPOFOL: SHX5780

## 2022-01-12 SURGERY — COLONOSCOPY WITH PROPOFOL
Anesthesia: General

## 2022-01-12 MED ORDER — LIDOCAINE 2% (20 MG/ML) 5 ML SYRINGE
INTRAMUSCULAR | Status: DC | PRN
  Administered 2022-01-12: 50 mg via INTRAVENOUS

## 2022-01-12 MED ORDER — PROPOFOL 10 MG/ML IV BOLUS
INTRAVENOUS | Status: DC | PRN
  Administered 2022-01-12: 30 mg via INTRAVENOUS
  Administered 2022-01-12 (×3): 20 mg via INTRAVENOUS

## 2022-01-12 MED ORDER — LACTATED RINGERS IV SOLN: INTRAVENOUS | Status: DC | PRN

## 2022-01-12 MED ORDER — PROPOFOL 500 MG/50ML IV EMUL
INTRAVENOUS | Status: DC | PRN
  Administered 2022-01-12: 125 ug/kg/min via INTRAVENOUS

## 2022-01-12 NOTE — Op Note (Signed)
[facility] Patient Name: Kelly Hebert Procedure Date: 01/12/2022 2:07 PM MRN: [id number] Date of Birth: Jun 26, 1940 Attending MD: Elon Alas. Abbey Chatters , [place], [id number] CSN: [id number] Age: 81 Admit Type: Outpatient Procedure:                Colonoscopy Indications:              Abdominal pain in the left lower quadrant Providers:                Elon Alas. Abbey Chatters, DO, Lurline Del, RN, Everardo Pacific Referring MD:              Medicines:                See the Anesthesia note for documentation of the                            administered medications Complications:            No immediate complications. Estimated Blood Loss:     Estimated blood loss was minimal. Procedure:                Pre-Anesthesia Assessment:                           - The anesthesia plan was to use monitored                            anesthesia care (MAC).                           After obtaining informed consent, the colonoscope                            was passed under direct vision. Throughout the                            procedure, the patient's blood pressure, pulse, and                            oxygen saturations were monitored continuously. The                            PCF-HQ190L ([id number]) scope was introduced through                            the anus and advanced to the the cecum, identified                            by appendiceal orifice and ileocecal valve. The                            colonoscopy was performed without difficulty. The                            patient tolerated the procedure  well. The quality                            of the bowel preparation was evaluated using the                            BBPS [place] Bowel Preparation Scale) with scores                            of: Right Colon = 2 (minor amount of residual                            staining, small fragments of stool and/or opaque                            liquid, but mucosa  seen well), Transverse Colon = 3                            (entire mucosa seen well with no residual staining,                            small fragments of stool or opaque liquid) and Left                            Colon = 3 (entire mucosa seen well with no residual                            staining, small fragments of stool or opaque                            liquid). The total BBPS score equals 8. The quality                            of the bowel preparation was good. Scope In: 2:12:26 PM Scope Out: 2:25:55 PM Scope Withdrawal Time: 0 hours 9 minutes 28 seconds  Total Procedure Duration: 0 hours 13 minutes 29 seconds  Findings:      The perianal and digital rectal examinations were normal.      Multiple medium-mouthed and small-mouthed diverticula were found in the       sigmoid colon.      A 4 mm polyp was found in the transverse colon. The polyp was sessile.       The polyp was removed with a cold snare. Resection and retrieval were       complete.      The exam was otherwise without abnormality. Impression:               - Diverticulosis in the sigmoid colon.                           - One 4 mm polyp in the transverse colon, removed                            with a cold snare. Resected and retrieved.                           -  The examination was otherwise normal. Moderate Sedation:      Per Anesthesia Care Recommendation:           - Patient has a contact number available for                            emergencies. The signs and symptoms of potential                            delayed complications were discussed with the                            patient. Return to normal activities tomorrow.                            Written discharge instructions were provided to the                            patient.                           - Resume previous diet.                           - Continue present medications.                           - Await pathology results.                            - No repeat colonoscopy due to age.                           - Return to GI clinic in 3 months. Procedure Code(s):        --- Professional ---                           [phone], Colonoscopy, flexible; with removal of                            tumor(s), polyp(s), or other lesion(s) by snare                            technique Diagnosis Code(s):        --- Professional ---                           D12.3, Benign neoplasm of transverse colon (hepatic                            flexure or splenic flexure)                           R10.32, Left lower quadrant pain                           K57.30, Diverticulosis of large intestine without  perforation or abscess without bleeding CPT copyright 2022 American Medical Association. All rights reserved. The codes documented in this report are preliminary and upon coder review may  be revised to meet current compliance requirements. Elon Alas. Abbey Chatters, DO [place] Abbey Chatters, DO 01/12/2022 2:32:13 PM This report has been signed electronically. Number of Addenda: 0

## 2022-01-12 NOTE — Anesthesia Preprocedure Evaluation (Signed)
Anesthesia Evaluation  Patient identified by MRN, date of birth, ID band Patient awake    Reviewed: Allergy & Precautions, H&P , NPO status , Patient's Chart, lab work & pertinent test results, reviewed documented beta blocker date and time   Airway Mallampati: II  TM Distance: >3 FB Neck ROM: full    Dental no notable dental hx.    Pulmonary asthma , COPD   Pulmonary exam normal breath sounds clear to auscultation       Cardiovascular Exercise Tolerance: Good hypertension, negative cardio ROS  Rhythm:regular Rate:Normal     Neuro/Psych  PSYCHIATRIC DISORDERS Anxiety Depression    negative neurological ROS     GI/Hepatic Neg liver ROS, PUD,GERD  Medicated,,  Endo/Other  Hypothyroidism    Renal/GU negative Renal ROS  negative genitourinary   Musculoskeletal   Abdominal   Peds  Hematology negative hematology ROS (+)   Anesthesia Other Findings   Reproductive/Obstetrics negative OB ROS                             Anesthesia Physical Anesthesia Plan  ASA: 3  Anesthesia Plan: General   Post-op Pain Management:    Induction:   PONV Risk Score and Plan: Propofol infusion  Airway Management Planned:   Additional Equipment:   Intra-op Plan:   Post-operative Plan:   Informed Consent: I have reviewed the patients History and Physical, chart, labs and discussed the procedure including the risks, benefits and alternatives for the proposed anesthesia with the patient or authorized representative who has indicated his/her understanding and acceptance.     Dental Advisory Given  Plan Discussed with: CRNA  Anesthesia Plan Comments:        Anesthesia Quick Evaluation

## 2022-01-12 NOTE — Anesthesia Postprocedure Evaluation (Signed)
Anesthesia Post Note  Patient: Kelly Hebert  Procedure(s) Performed: COLONOSCOPY WITH PROPOFOL ESOPHAGOGASTRODUODENOSCOPY (EGD) WITH PROPOFOL POLYPECTOMY  Patient location during evaluation: Phase II Anesthesia Type: General Level of consciousness: awake Pain management: pain level controlled Vital Signs Assessment: post-procedure vital signs reviewed and stable Respiratory status: spontaneous breathing and respiratory function stable Cardiovascular status: blood pressure returned to baseline and stable Postop Assessment: no headache and no apparent nausea or vomiting Anesthetic complications: no Comments: Late entry   No notable events documented.   Last Vitals:  Vitals:   01/12/22 1142 01/12/22 1436  BP: (!) 138/58 (!) 116/52  Pulse: 84 93  Resp: 14 13  Temp: 37.2 C 37.1 C  SpO2: 99% 100%    Last Pain:  Vitals:   01/12/22 1436  TempSrc: Oral  PainSc: 0-No pain                 Louann Sjogren

## 2022-01-12 NOTE — Discharge Instructions (Signed)
EGD Discharge instructions Please read the instructions outlined below and refer to this sheet in the next few weeks. These discharge instructions provide you with general information on caring for yourself after you leave the hospital. Your doctor may also give you specific instructions. While your treatment has been planned according to the most current medical practices available, unavoidable complications occasionally occur. If you have any problems or questions after discharge, please call your doctor. ACTIVITY You may resume your regular activity but move at a slower pace for the next 24 hours.  Take frequent rest periods for the next 24 hours.  Walking will help expel (get rid of) the air and reduce the bloated feeling in your abdomen.  No driving for 24 hours (because of the anesthesia (medicine) used during the test).  You may shower.  Do not sign any important legal documents or operate any machinery for 24 hours (because of the anesthesia used during the test).  NUTRITION Drink plenty of fluids.  You may resume your normal diet.  Begin with a light meal and progress to your normal diet.  Avoid alcoholic beverages for 24 hours or as instructed by your caregiver.  MEDICATIONS You may resume your normal medications unless your caregiver tells you otherwise.  WHAT YOU CAN EXPECT TODAY You may experience abdominal discomfort such as a feeling of fullness or "gas" pains.  FOLLOW-UP Your doctor will discuss the results of your test with you.  SEEK IMMEDIATE MEDICAL ATTENTION IF ANY OF THE FOLLOWING OCCUR: Excessive nausea (feeling sick to your stomach) and/or vomiting.  Severe abdominal pain and distention (swelling).  Trouble swallowing.  Temperature over 101 F (37.8 C).  Rectal bleeding or vomiting of blood.    Colonoscopy Discharge Instructions  Read the instructions outlined below and refer to this sheet in the next few weeks. These discharge instructions provide you with  general information on caring for yourself after you leave the hospital. Your doctor may also give you specific instructions. While your treatment has been planned according to the most current medical practices available, unavoidable complications occasionally occur.   ACTIVITY You may resume your regular activity, but move at a slower pace for the next 24 hours.  Take frequent rest periods for the next 24 hours.  Walking will help get rid of the air and reduce the bloated feeling in your belly (abdomen).  No driving for 24 hours (because of the medicine (anesthesia) used during the test).   Do not sign any important legal documents or operate any machinery for 24 hours (because of the anesthesia used during the test).  NUTRITION Drink plenty of fluids.  You may resume your normal diet as instructed by your doctor.  Begin with a light meal and progress to your normal diet. Heavy or fried foods are harder to digest and may make you feel sick to your stomach (nauseated).  Avoid alcoholic beverages for 24 hours or as instructed.  MEDICATIONS You may resume your normal medications unless your doctor tells you otherwise.  WHAT YOU CAN EXPECT TODAY Some feelings of bloating in the abdomen.  Passage of more gas than usual.  Spotting of blood in your stool or on the toilet paper.  IF YOU HAD POLYPS REMOVED DURING THE COLONOSCOPY: No aspirin products for 7 days or as instructed.  No alcohol for 7 days or as instructed.  Eat a soft diet for the next 24 hours.  FINDING OUT THE RESULTS OF YOUR TEST Not all test results are available  during your visit. If your test results are not back during the visit, make an appointment with your caregiver to find out the results. Do not assume everything is normal if you have not heard from your caregiver or the medical facility. It is important for you to follow up on all of your test results.  SEEK IMMEDIATE MEDICAL ATTENTION IF: You have more than a spotting of  blood in your stool.  Your belly is swollen (abdominal distention).  You are nauseated or vomiting.  You have a temperature over 101.  You have abdominal pain or discomfort that is severe or gets worse throughout the day.   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  By passing the endoscope through this area, I stretched it out.  We likely need to repeat EGD in 4 to 6 weeks and I will try to stretch it out further to get you more relief.  In the meantime, Recommend to avoid tough textures. All meats should be chopped finely. Eat slowly, take small bites, chew thoroughly, and drink plenty of liquids throughout meals.   Stomach contained multiple benign polyps.  Small bowel appeared normal.  Overall your colon looked very healthy.  No active inflammation throughout your entire colon.  1 polyp removed. You also have diverticulosis and internal hemorrhoids. I would recommend increasing fiber in your diet or adding OTC Benefiber/Metamucil. Be sure to drink at least 4 to 6 glasses of water daily.   Continue on Linzess.  Follow-up with GI in 2 to 3 months   I hope you have a great rest of your week!  Elon Alas. Abbey Chatters, D.O. Gastroenterology and Hepatology [facility]

## 2022-01-12 NOTE — H&P (Signed)
Primary Care Physician:  [facility]ologist:  Dr. Abbey Chatters  Pre-Procedure History & Physical: HPI:  Kelly Hebert is a 81 y.o. female is here for an EGD with possible dilation due to chronic GERD dysphagia and colonoscopy due to left lower quadrant abdominal pain.    Past Medical History:  Diagnosis Date   Acid reflux    Anxiety    Arthritis    Asthma    Constipation    COPD (chronic obstructive pulmonary disease) (HCC)    Depression    Gout    Heart murmur    Hx of pyloric stenosis 11/14/2011   Hypercholesteremia    Hyperglycemia    Hypertension    Hypothyroidism    Renal disorder    pelvic kidney   Retinitis pigmentosa    S/P colonoscopy 2003   Dr. Nicolasa Ducking: normal colon, normal path.    Stomach ulcer    Vitamin D deficiency disease     Past Surgical History:  Procedure Laterality Date   ABDOMINAL HYSTERECTOMY     complete   BREAST SURGERY     breast biopsy    COLONOSCOPY  03/09/2011   QMG:QQPYPPJ diverticula, small internal hemorrhoids.    ESOPHAGOGASTRODUODENOSCOPY  03/09/2011   KDT:OIZTIWPYK in the distal esophagus/ Stenosis at the pylorus/ Mild gastritis/ Hiatal hernia. gastric bx showed gastritis but no H.pylori   ESOPHAGOGASTRODUODENOSCOPY  03/20/2011   SLF: stricture distal esophgus and stenosis at pyloris likely from NSAIDs. s/p dialtion.    ESOPHAGOGASTRODUODENOSCOPY N/A 01/24/2017   Procedure: ESOPHAGOGASTRODUODENOSCOPY (EGD);  Surgeon: Danie Binder, MD;  [facility];  Service: Endoscopy;  Laterality: N/A;  10:45am   ESOPHAGOGASTRODUODENOSCOPY N/A 02/08/2017   Procedure: ESOPHAGOGASTRODUODENOSCOPY (EGD);  Surgeon: Danie Binder, MD;  Location: [facility];  Service: Endoscopy;  Laterality: N/A;  12:45pm   ESOPHAGOGASTRODUODENOSCOPY N/A 04/12/2017   Procedure: ESOPHAGOGASTRODUODENOSCOPY (EGD);  Surgeon: Danie Binder, MD;  [facility]: [facility];  Service: Endoscopy;  Laterality: N/A;  1:30pm    ESOPHAGOGASTRODUODENOSCOPY (EGD) WITH ESOPHAGEAL DILATION  11/23/2011   SLF: Stricture was found at the gastroesophageal junction/ Small hiatal hernia/  Sessile polyp was found in the gastric body and gastric fundus/ Stenosis was found at the pylorus/ The duodenal mucosa showed no abnormalities in the bulb and second portion of the duodenum   ESOPHAGOGASTRODUODENOSCOPY (EGD) WITH ESOPHAGEAL DILATION  12/03/2011   DXI:PJASN was found in the entire examined stomach/Stricture was found at the gastroesophageal junction   LAPAROSCOPY  01/22/1998   [place]-stomach pain-pt reports negative   ROTATOR CUFF REPAIR Right    SAVORY DILATION N/A 01/24/2017   Procedure: SAVORY DILATION;  Surgeon: Danie Binder, MD;  [facility]: [facility];  Service: Endoscopy;  Laterality: N/A;   SAVORY DILATION N/A 02/08/2017   Procedure: SAVORY DILATION;  Surgeon: Danie Binder, MD;  [facility];  Service: Endoscopy;  Laterality: N/A;   SAVORY DILATION N/A 04/12/2017   Procedure: SAVORY DILATION;  Surgeon: Danie Binder, MD;  [facility]: [facility];  Service: Endoscopy;  Laterality: N/A;    Prior to Admission medications   Medication Sig Start Date End Date Taking? Authorizing Provider  albuterol (PROVENTIL HFA;VENTOLIN HFA) 108 (90 BASE) MCG/ACT inhaler Inhale 2 puffs into the lungs every 6 (six) hours as needed for wheezing or shortness of breath.    Yes [provider]  amitriptyline (ELAVIL) 10 MG tablet Take 10 mg by mouth at bedtime.  07/29/12  Yes [provider]  aspirin EC 81 MG tablet Take 81 mg by mouth daily.   Yes [provider]  azelastine (ASTELIN) 0.1 % nasal spray Place 1 spray into both nostrils 2 (two) times daily. Use in each nostril as directed   Yes [provider]  benazepril-hydrochlorthiazide (LOTENSIN HCT) 20-25 MG tablet Take 1 tablet by mouth daily.  10/03/11  Yes [provider]  buPROPion (WELLBUTRIN XL) 150 MG 24 hr tablet   04/01/19  Yes [provider]  cetirizine (ZYRTEC) 10 MG tablet Take 10 mg by mouth daily.   Yes [provider]  citalopram (CELEXA) 40 MG tablet Take 40 mg by mouth daily.     Yes [provider]  DEXILANT 60 MG capsule TAKE 1 CAPSULE BY MOUTH EVERY DAY 02/21/20  Yes Carlis Stable, NP  fluticasone (FLONASE) 50 MCG/ACT nasal spray Place 1 spray into both nostrils 2 (two) times daily.   Yes [provider]  fluticasone (FLOVENT HFA) 110 MCG/ACT inhaler Inhale 2 puffs into the lungs 2 (two) times daily as needed (for shortness of breath).    Yes [provider]  levothyroxine (SYNTHROID, LEVOTHROID) 50 MCG tablet Take 50 mcg by mouth daily before breakfast.  04/10/12  Yes [provider]  linaclotide (LINZESS) 290 MCG CAPS capsule Take 1 capsule (290 mcg total) by mouth daily before breakfast. 10/24/21  Yes Mahon, Courtney L, NP  LORazepam (ATIVAN) 1 MG tablet Take 1 mg by mouth 2 (two) times daily as needed for anxiety.    Yes [provider]  montelukast (SINGULAIR) 10 MG tablet Take 10 mg by mouth daily. 09/01/21  Yes [provider]  potassium chloride (KLOR-CON) 20 MEQ packet Take 20 mEq by mouth daily.   Yes [provider]  pravastatin (PRAVACHOL) 20 MG tablet Take 20 mg by mouth daily.   Yes [provider]  predniSONE (DELTASONE) 50 MG tablet Take 1 tablet 13 hours, 7 hours, 1 hour prior to study 09/21/21  Yes Hurshel Keys K, DO  amLODipine (NORVASC) 10 MG tablet Take 10 mg by mouth daily.    [provider]  diphenhydrAMINE (BENADRYL) 50 MG tablet Take 1 tablet (50 mg total) by mouth once for 1 dose. Take 1 hour prior to study 09/21/21 09/21/21  Eloise Harman, DO    Allergies as of 11/27/2021 - Review Complete 10/24/2021  Allergen Reaction Noted   Ivp dye [iodinated contrast media] Hives, Itching, and Other (See Comments) 01/06/2011   Nifedipine Nausea And Vomiting and Other (See Comments)  03/02/2014   Sulfa antibiotics Rash 02/15/2020    Family History  Problem Relation Age of Onset   Kidney disease Sister    Cirrhosis Brother        ?etoh   Diabetes Brother    Heart attack Brother    Heart disease Mother    Heart attack Mother    Heart disease Father    Heart attack Father    Diabetes Brother    Cirrhosis Sister        Non-etoh   Colon cancer Neg Hx     Social History   Socioeconomic History   Marital status: Married    Spouse name: Not on file   Number of children: 3   Years of education: Not on file   Highest education level: Not on file  Occupational History   Occupation: retired Charity fundraiser  Tobacco Use   Smoking status: Never   Smokeless tobacco: Never  Media planner  Vaping Use: Never used  Substance and Sexual Activity   Alcohol use: No   Drug use: No   Sexual activity: Not Currently  Other Topics Concern   Not on file  Social History Narrative   Not on file   Social Determinants of Health   Financial Resource Strain: Not on file  Food Insecurity: Not on file  Transportation Needs: Not on file  Physical Activity: Not on file  Stress: Not on file  Social Connections: Not on file  Intimate Partner Violence: Not on file    Review of Systems: General: Negative for fever, chills, fatigue, weakness. Eyes: Negative for vision changes.  ENT: Negative for hoarseness, difficulty swallowing , nasal congestion. CV: Negative for chest pain, angina, palpitations, dyspnea on exertion, peripheral edema.  Respiratory: Negative for dyspnea at rest, dyspnea on exertion, cough, sputum, wheezing.  GI: See history of present illness. GU:  Negative for dysuria, hematuria, urinary incontinence, urinary frequency, nocturnal urination.  MS: Negative for joint pain, low back pain.  Derm: Negative for rash or itching.  Neuro: Negative for weakness, abnormal sensation, seizure, frequent headaches, memory loss, confusion.  Psych: Negative for anxiety,  depression Endo: Negative for unusual weight change.  Heme: Negative for bruising or bleeding. Allergy: Negative for rash or hives.  Physical Exam: Vital signs in last 24 hours: Temp:  [99 F (37.2 C)] 99 F (37.2 C) (12/22 1142) Pulse Rate:  [84] 84 (12/22 1142) Resp:  [14] 14 (12/22 1142) BP: (138)/(58) 138/58 (12/22 1142) SpO2:  [99 %] 99 % (12/22 1142) Weight:  [63 kg] 63 kg (12/22 1142)   General:   Alert,  Well-developed, well-nourished, pleasant and cooperative in NAD Head:  Normocephalic and atraumatic. Eyes:  Sclera clear, no icterus.   Conjunctiva pink. Ears:  Normal auditory acuity. Nose:  No deformity, discharge,  or lesions. Msk:  Symmetrical without gross deformities. Normal posture. Extremities:  Without clubbing or edema. Neurologic:  Alert and  oriented x4;  grossly normal neurologically. Skin:  Intact without significant lesions or rashes. Psych:  Alert and cooperative. Normal mood and affect.   Impression/Plan: Kelly Hebert is here for an EGD with possible dilation due to chronic GERD dysphagia and colonoscopy due to left lower quadrant abdominal pain.     Risks, benefits, limitations, imponderables and alternatives regarding EGD have been reviewed with the patient. Questions have been answered. All parties agreeable.

## 2022-01-12 NOTE — Op Note (Signed)
[facility] Patient Name: Kelly Hebert Procedure Date: 01/12/2022 1:51 PM MRN: [id number] Date of Birth: Nov 25, 1940 Attending MD: Elon Alas. Abbey Chatters , [place], [id number] CSN: [id number] Age: 81 Admit Type: Outpatient Procedure:                Upper GI endoscopy Indications:              Dysphagia, Heartburn Providers:                Elon Alas. Abbey Chatters, DO, Lurline Del, RN, Everardo Pacific Referring MD:              Medicines:                See the Anesthesia note for documentation of the                            administered medications Complications:            No immediate complications. Estimated Blood Loss:     Estimated blood loss: none. Estimated blood loss                            was minimal. Procedure:                Pre-Anesthesia Assessment:                           - The anesthesia plan was to use monitored                            anesthesia care (MAC).                           After obtaining informed consent, the endoscope was                            passed under direct vision. Throughout the                            procedure, the patient's blood pressure, pulse, and                            oxygen saturations were monitored continuously. The                            GIF-H190 ([id number]) scope was introduced through the                            mouth, and advanced to the second part of duodenum.                            The upper GI endoscopy was accomplished without                            difficulty. The patient tolerated the procedure  well. Scope In: 2:03:38 PM Scope Out: 2:06:58 PM Total Procedure Duration: 0 hours 3 minutes 20 seconds  Findings:      One benign-appearing, intrinsic severe stenosis was found. This stenosis       measured 6 mm (inner diameter) x less than one cm (in length). The       stenosis was dilated simply by passing endoscope through it with mild       mucosal  disruption.      Mildly severe esophagitis with no bleeding was found at the       gastroesophageal junction.      Many 3 to 10 mm sessile polyps with no bleeding and no stigmata of       recent bleeding were found in the entire examined stomach.      The duodenal bulb, first portion of the duodenum and second portion of       the duodenum were normal. Impression:               - Benign-appearing esophageal stenosis.                           - Mildly severe esophagitis with no bleeding.                           - Many gastric polyps.                           - Normal duodenal bulb, first portion of the                            duodenum and second portion of the duodenum.                           - No specimens collected. Moderate Sedation:      Per Anesthesia Care Recommendation:           - Patient has a contact number available for                            emergencies. The signs and symptoms of potential                            delayed complications were discussed with the                            patient. Return to normal activities tomorrow.                            Written discharge instructions were provided to the                            patient.                           - Mechanical soft diet.                           - No ibuprofen, naproxen, or other non-steroidal  anti-inflammatory drugs.                           - Use Dexilant (dexlansoprazole) 60 mg PO daily.                           - Repeat upper endoscopy in 4 weeks for dilation.                            Dilation not performed today as stricture was                            dilated with mucosal disruption by passing                            endoscope through it. Procedure Code(s):        --- Professional ---                           [phone], Esophagogastroduodenoscopy, flexible,                            transoral; diagnostic, including collection of                             specimen(s) by brushing or washing, when performed                            (separate procedure) Diagnosis Code(s):        --- Professional ---                           K22.2, Esophageal obstruction                           K20.90, Esophagitis, unspecified without bleeding                           K31.7, Polyp of stomach and duodenum                           R13.10, Dysphagia, unspecified                           R12, Heartburn CPT copyright 2022 American Medical Association. All rights reserved. The codes documented in this report are preliminary and upon coder review may  be revised to meet current compliance requirements. Elon Alas. Abbey Chatters, DO [place] Abbey Chatters, DO 01/12/2022 2:28:44 PM This report has been signed electronically. Number of Addenda: 0

## 2022-01-12 NOTE — Transfer of Care (Signed)
Immediate Anesthesia Transfer of Care Note  Patient: Kelly Hebert  Procedure(s) Performed: COLONOSCOPY WITH PROPOFOL ESOPHAGOGASTRODUODENOSCOPY (EGD) WITH PROPOFOL POLYPECTOMY  Patient Location: PACU  Anesthesia Type:MAC  Level of Consciousness: awake, alert , and oriented  Airway & Oxygen Therapy: Patient Spontanous Breathing and Patient connected to nasal cannula oxygen  Post-op Assessment: Report given to RN and Post -op Vital signs reviewed and stable  Post vital signs: Reviewed and stable  Last Vitals:  Vitals Value Taken Time  BP 132/80   Temp    Pulse 88   Resp 16   SpO2 99%     Last Pain:  Vitals:   01/12/22 1357  TempSrc:   PainSc: 0-No pain         Complications: No notable events documented.

## 2022-01-17 ENCOUNTER — Telehealth: Payer: Self-pay | Admitting: Internal Medicine

## 2022-01-17 LAB — SURGICAL PATHOLOGY

## 2022-01-17 NOTE — Telephone Encounter (Signed)
The hospital left a message for [place] to schedule a return office visit which I have already done.  They also said to repeat EGD in 4-6 weeks.

## 2022-01-18 NOTE — Telephone Encounter (Signed)
Called pt to schedule and she did not want to schedule. Right now. She will think about it and call back when she is ready. FYI

## 2022-01-24 ENCOUNTER — Encounter (HOSPITAL_COMMUNITY): Payer: Self-pay | Admitting: Internal Medicine

## 2022-03-07 ENCOUNTER — Ambulatory Visit: Payer: Medicare Other | Admitting: Cardiology

## 2022-03-29 ENCOUNTER — Ambulatory Visit: Payer: Medicare Other | Admitting: Internal Medicine

## 2022-05-09 ENCOUNTER — Ambulatory Visit: Payer: Medicare Other | Admitting: Cardiology

## 2022-05-30 ENCOUNTER — Encounter: Payer: Self-pay | Admitting: Cardiology

## 2022-05-30 ENCOUNTER — Ambulatory Visit: Payer: Medicare Other | Attending: Cardiology | Admitting: Cardiology

## 2022-05-30 VITALS — BP 118/62 | HR 77 | Ht 62.0 in | Wt 128.0 lb

## 2022-05-30 DIAGNOSIS — I10 Essential (primary) hypertension: Secondary | ICD-10-CM | POA: Diagnosis not present

## 2022-05-30 DIAGNOSIS — I6523 Occlusion and stenosis of bilateral carotid arteries: Secondary | ICD-10-CM | POA: Diagnosis not present

## 2022-05-30 DIAGNOSIS — E782 Mixed hyperlipidemia: Secondary | ICD-10-CM | POA: Diagnosis not present

## 2022-05-30 NOTE — Progress Notes (Signed)
Clinical Summary Ms. Mortenson is a 82 y.o.female seen today as a new patient for the following medical problems. Last seen in our clinic in 2018.    1. Carotid stenosis - 03/2016 carotid US with LICA 50-69%, large plaque RICA without significant stenosis - 04/2016 CTA neck bulkly plaque LICA 60% stenosis, RICA 35%   - no neuro symptoms - she is due for a repeat US       2. HTN - compliant with meds     3. Hyperlipidemia 11/2020 TC 187 TG 127 HDL 48 LDL 115 - taking atorvastatin 40mg  every other day due to stomach upset. Ongoing symptoms despite med chagne. Reports she had stopepd taking at one point and symptoms did improve.   Atorva 40mg  qod caused upset stomach, chagned to crestor 5mg  daily - appears she is on pravastatin instead, unclear history - tolerating pravastatin  Past Medical History:  Diagnosis Date   Acid reflux    Anxiety    Arthritis    Asthma    Constipation    COPD (chronic obstructive pulmonary disease) (HCC)    Depression    Gout    Heart murmur    Hx of pyloric stenosis 11/14/2011   Hypercholesteremia    Hyperglycemia    Hypertension    Hypothyroidism    Renal disorder    pelvic kidney   Retinitis pigmentosa    S/P colonoscopy 2003   Dr. Maryruth Bun: normal colon, normal path.    Stomach ulcer    Vitamin D deficiency disease      Allergies  Allergen Reactions   Ivp Dye [Iodinated Contrast Media] Hives, Itching and Other (See Comments)    Pt. States this happened 4 or 5 years ago in [place].     Nifedipine Nausea And Vomiting and Other (See Comments)    unknown   Sulfa Antibiotics Rash     Current Outpatient Medications  Medication Sig Dispense Refill   albuterol (PROVENTIL HFA;VENTOLIN HFA) 108 (90 BASE) MCG/ACT inhaler Inhale 2 puffs into the lungs every 6 (six) hours as needed for wheezing or shortness of breath.      amitriptyline (ELAVIL) 10 MG tablet Take 10 mg by mouth at bedtime.      amLODipine (NORVASC) 10 MG tablet Take  10 mg by mouth daily.     aspirin EC 81 MG tablet Take 81 mg by mouth daily.     azelastine (ASTELIN) 0.1 % nasal spray Place 1 spray into both nostrils 2 (two) times daily. Use in each nostril as directed     benazepril-hydrochlorthiazide (LOTENSIN HCT) 20-25 MG tablet Take 1 tablet by mouth daily.      buPROPion (WELLBUTRIN XL) 150 MG 24 hr tablet      cetirizine (ZYRTEC) 10 MG tablet Take 10 mg by mouth daily.     citalopram (CELEXA) 40 MG tablet Take 40 mg by mouth daily.       DEXILANT 60 MG capsule TAKE 1 CAPSULE BY MOUTH EVERY DAY 30 capsule 5   diphenhydrAMINE (BENADRYL) 50 MG tablet Take 1 tablet (50 mg total) by mouth once for 1 dose. Take 1 hour prior to study 1 tablet 0   fluticasone (FLONASE) 50 MCG/ACT nasal spray Place 1 spray into both nostrils 2 (two) times daily.     fluticasone (FLOVENT HFA) 110 MCG/ACT inhaler Inhale 2 puffs into the lungs 2 (two) times daily as needed (for shortness of breath).      levothyroxine (SYNTHROID, LEVOTHROID) 50  MCG tablet Take 50 mcg by mouth daily before breakfast.      linaclotide (LINZESS) 290 MCG CAPS capsule Take 1 capsule (290 mcg total) by mouth daily before breakfast. 30 capsule 3   LORazepam (ATIVAN) 1 MG tablet Take 1 mg by mouth 2 (two) times daily as needed for anxiety.      montelukast (SINGULAIR) 10 MG tablet Take 10 mg by mouth daily.     potassium chloride (KLOR-CON) 20 MEQ packet Take 20 mEq by mouth daily.     pravastatin (PRAVACHOL) 20 MG tablet Take 20 mg by mouth daily.     predniSONE (DELTASONE) 50 MG tablet Take 1 tablet 13 hours, 7 hours, 1 hour prior to study 3 tablet 0   No current facility-administered medications for this visit.     Past Surgical History:  Procedure Laterality Date   ABDOMINAL HYSTERECTOMY     complete   BREAST SURGERY     breast biopsy    COLONOSCOPY  03/09/2011   ZOX:WRUEAVW diverticula, small internal hemorrhoids.    COLONOSCOPY WITH PROPOFOL N/A 01/12/2022   Procedure: COLONOSCOPY WITH  PROPOFOL;  Surgeon: Lanelle Bal, DO;  [facility]: [facility];  Service: Endoscopy;  Laterality: N/A;  12:30 pm   ESOPHAGOGASTRODUODENOSCOPY  03/09/2011   UJW:[id number] in the distal esophagus/ Stenosis at the pylorus/ Mild gastritis/ Hiatal hernia. gastric bx showed gastritis but no H.pylori   ESOPHAGOGASTRODUODENOSCOPY  03/20/2011   SLF: stricture distal esophgus and stenosis at pyloris likely from NSAIDs. s/p dialtion.    ESOPHAGOGASTRODUODENOSCOPY N/A 01/24/2017   Procedure: ESOPHAGOGASTRODUODENOSCOPY (EGD);  Surgeon: West Bali, MD;  [facility]: [facility];  Service: Endoscopy;  Laterality: N/A;  10:45am   ESOPHAGOGASTRODUODENOSCOPY N/A 02/08/2017   Procedure: ESOPHAGOGASTRODUODENOSCOPY (EGD);  Surgeon: West Bali, MD;  [facility];  Service: Endoscopy;  Laterality: N/A;  12:45pm   ESOPHAGOGASTRODUODENOSCOPY N/A 04/12/2017   Procedure: ESOPHAGOGASTRODUODENOSCOPY (EGD);  Surgeon: West Bali, MD;  [facility]: [facility];  Service: Endoscopy;  Laterality: N/A;  1:30pm   ESOPHAGOGASTRODUODENOSCOPY (EGD) WITH ESOPHAGEAL DILATION  11/23/2011   SLF: Stricture was found at the gastroesophageal junction/ Small hiatal hernia/  Sessile polyp was found in the gastric body and gastric fundus/ Stenosis was found at the pylorus/ The duodenal mucosa showed no abnormalities in the bulb and second portion of the duodenum   ESOPHAGOGASTRODUODENOSCOPY (EGD) WITH ESOPHAGEAL DILATION  12/03/2011   FAO:ZHYQM was found in the entire examined stomach/Stricture was found at the gastroesophageal junction   ESOPHAGOGASTRODUODENOSCOPY (EGD) WITH PROPOFOL N/A 01/12/2022   Procedure: ESOPHAGOGASTRODUODENOSCOPY (EGD) WITH PROPOFOL;  Surgeon: Lanelle Bal, DO;  [facility];  Service: Endoscopy;  Laterality: N/A;   LAPAROSCOPY  01/22/1998   Thayer-stomach pain-pt reports negative   POLYPECTOMY  01/12/2022   Procedure: POLYPECTOMY;  Surgeon: Lanelle Bal, DO;   [facility]: [facility];  Service: Endoscopy;;   ROTATOR CUFF REPAIR Right    SAVORY DILATION N/A 01/24/2017   Procedure: SAVORY DILATION;  Surgeon: West Bali, MD;  [facility]: [facility];  Service: Endoscopy;  Laterality: N/A;   SAVORY DILATION N/A 02/08/2017   Procedure: SAVORY DILATION;  Surgeon: West Bali, MD;  [facility];  Service: Endoscopy;  Laterality: N/A;   SAVORY DILATION N/A 04/12/2017   Procedure: SAVORY DILATION;  Surgeon: West Bali, MD;  [facility]: [facility];  Service: Endoscopy;  Laterality: N/A;     Allergies  Allergen Reactions   Ivp Dye [Iodinated Contrast  Media] Hives, Itching and Other (See Comments)    Pt. States this happened 4 or 5 years ago in [place].     Nifedipine Nausea And Vomiting and Other (See Comments)    unknown   Sulfa Antibiotics Rash      Family History  Problem Relation Age of Onset   Kidney disease Sister    Cirrhosis Brother        ?etoh   Diabetes Brother    Heart attack Brother    Heart disease Mother    Heart attack Mother    Heart disease Father    Heart attack Father    Diabetes Brother    Cirrhosis Sister        Non-etoh   Colon cancer Neg Hx      Social History Ms. Troth reports that she has never smoked. She has never used smokeless tobacco. Ms. Cavanna reports no history of alcohol use.   Review of Systems CONSTITUTIONAL: No weight loss, fever, chills, weakness or fatigue.  HEENT: Eyes: No visual loss, blurred vision, double vision or yellow sclerae.No hearing loss, sneezing, congestion, runny nose or sore throat.  SKIN: No rash or itching.  CARDIOVASCULAR: per hpi RESPIRATORY: No shortness of breath, cough or sputum.  GASTROINTESTINAL: No anorexia, nausea, vomiting or diarrhea. No abdominal pain or blood.  GENITOURINARY: No burning on urination, no polyuria NEUROLOGICAL: No headache, dizziness, syncope, paralysis, ataxia, numbness or tingling in the extremities. No change in  bowel or bladder control.  MUSCULOSKELETAL: No muscle, back pain, joint pain or stiffness.  LYMPHATICS: No enlarged nodes. No history of splenectomy.  PSYCHIATRIC: No history of depression or anxiety.  ENDOCRINOLOGIC: No reports of sweating, cold or heat intolerance. No polyuria or polydipsia.  Marland Kitchen   Physical Examination Today's Vitals   05/30/22 1426  BP: 118/62  Pulse: 77  SpO2: 99%  Weight: 128 lb (58.1 kg)  Height: 5\' 2"  (1.575 m)   Body mass index is 23.41 kg/m.  Gen: resting comfortably, no acute distress HEENT: no scleral icterus, pupils equal round and reactive, no palptable cervical adenopathy,  CV: RRR, no mrg, no jvd Resp: Clear to auscultation bilaterally GI: abdomen is soft, non-tender, non-distended, normal bowel sounds, no hepatosplenomegaly MSK: extremities are warm, no edema.  Skin: warm, no rash Neuro:  no focal deficits Psych: appropriate affect   Diagnostic Studies  06/2016 echo Study Conclusions   - Left ventricle: The cavity size was normal. Systolic function was    normal. The estimated ejection fraction was in the range of 60%    to 65%. Wall motion was normal; there were no regional wall    motion abnormalities. Left ventricular diastolic function    parameters were normal.  - Aortic valve: Valve area (VTI): 1.78 cm^2. Valve area (Vmax):    1.61 cm^2. Valve area (Vmean): 1.71 cm^2.  - Left atrium: Some shadowing artifact in LA on subcostal images.  - Atrial septum: No defect or patent foramen ovale was identified.      Assessment and Plan   1. Carotid stenosis - moderate left and mild right ICA stenosis - overdue for repeat US, will repeat scan - continue aspirin, statin     2. HTN - at goal, continue current meds   3. Hyperlipidemia - stomach upset on atorva - last visit we had changed to crestor but appears she is now taking pravastatin. Unclear of the history - tolerating pravastatin, request labs and clinic notes from pcp  F/u  1 year  Arnoldo Lenis, M.D.

## 2022-05-30 NOTE — Patient Instructions (Signed)
Medication Instructions:  Your physician recommends that you continue on your current medications as directed. Please refer to the Current Medication list given to you today.  *If you need a refill on your cardiac medications before your next appointment, please call your pharmacy*   Lab Work: None If you have labs (blood work) drawn today and your tests are completely normal, you will receive your results only by: MyChart Message (if you have MyChart) OR A paper copy in the mail If you have any lab test that is abnormal or we need to change your treatment, we will call you to review the results.   Testing/Procedures: Your physician has requested that you have a carotid duplex. This test is an ultrasound of the carotid arteries in your neck. It looks at blood flow through these arteries that supply the brain with blood. Allow one hour for this exam. There are no restrictions or special instructions.    Follow-Up: At [facility], you and your health needs are our priority.  As part of our continuing mission to provide you with exceptional heart care, we have created designated Provider Care Teams.  These Care Teams include your primary Cardiologist (physician) and Advanced Practice Providers (APPs -  Physician Assistants and Nurse Practitioners) who all work together to provide you with the care you need, when you need it.  We recommend signing up for the patient portal called "MyChart".  Sign up information is provided on this After Visit Summary.  MyChart is used to connect with patients for Virtual Visits (Telemedicine).  Patients are able to view lab/test results, encounter notes, upcoming appointments, etc.  Non-urgent messages can be sent to your provider as well.   To learn more about what you can do with MyChart, go to [email or web address].    Your next appointment:   1 year(s)  Provider:   Jonathan Branch, MD    Other Instructions    

## 2022-05-31 ENCOUNTER — Encounter: Payer: Self-pay | Admitting: Nurse Practitioner

## 2022-06-12 ENCOUNTER — Ambulatory Visit (HOSPITAL_COMMUNITY)
Admission: RE | Admit: 2022-06-12 | Discharge: 2022-06-12 | Disposition: A | Discharge status: Home / Self Care | Payer: Medicare Other | Source: Ambulatory Visit | Attending: Cardiology | Admitting: Cardiology

## 2022-06-12 DIAGNOSIS — I6523 Occlusion and stenosis of bilateral carotid arteries: Secondary | ICD-10-CM | POA: Insufficient documentation

## 2022-06-26 ENCOUNTER — Telehealth: Payer: Self-pay

## 2022-06-26 NOTE — Telephone Encounter (Signed)
Patient notified and verbalized understanding of results. Patient had no questions or concerns at this time. PCP copied.  

## 2022-06-26 NOTE — Telephone Encounter (Signed)
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

## 2022-10-08 ENCOUNTER — Telehealth: Payer: Self-pay

## 2022-10-08 NOTE — Telephone Encounter (Signed)
Pt phoned wanting to know can you phone in something for her stomach pain. She states she has an ulcer and it hurts. She is taking her Dexilant she stated. She also states she cannot get in for a visit right now. Please advise

## 2022-10-08 NOTE — Telephone Encounter (Signed)
Phoned the pt, no ans/ LM on vm for pt to return call

## 2022-10-10 ENCOUNTER — Other Ambulatory Visit: Payer: Self-pay | Admitting: Gastroenterology

## 2022-10-10 MED ORDER — SUCRALFATE 1 G PO TABS
1.0000 g | ORAL_TABLET | Freq: Three times a day (TID) | ORAL | 0 refills | Status: DC
Start: 2022-10-10 — End: 2022-11-06

## 2022-10-10 NOTE — Telephone Encounter (Signed)
Phoned the pt back. its the LLQ and upper left where the pain is and Sunday it was also the bottom of her abdomen. Pt states the Saturday after eating green peas and meatloaf she was sick in bed all day Sunday with the abd pain. Her reflux symptoms are the same. She has a BM everyday . She states she is taking 2 Linzess every day. Pt was advised that you are sending in 2 weeks of Carafate for her and if there is anything else to be sent in it will be done at that time. Pt expressed understanding.

## 2022-10-10 NOTE — Telephone Encounter (Signed)
Phoned and advised the pt of your instructions/ directions. Pt expressed understanding

## 2022-11-04 ENCOUNTER — Other Ambulatory Visit: Payer: Self-pay | Admitting: Cardiology

## 2022-11-05 NOTE — Progress Notes (Unsigned)
GI Office Note    Referring Provider: [facility]* Primary Care Physician:  [facility] Primary Gastroenterologist: Hennie Duos. Marletta Lor, DO  Date:  11/06/2022  ID:  Kelly Hebert, DOB Oct 21, 1940, MRN [id number]   Chief Complaint   Chief Complaint  Patient presents with   Abdominal Pain    Stomach hurts when she eats.    History of Present Illness  Kelly Hebert is a 82 y.o. female with a history of anxiety, depression, asthma, endometrial cancer, GERD, COPD, hypothyroidism, HLD, and vitamin D deficiency presenting today with complaint of ongoing abdominal pain.   Office visit 09/21/2021 with Dr. Marletta Lor.  Complaint of dysphagia, lower abdominal pain, and reflux.  No history of chronic GERD, maintained on Dexilant 60 mg daily.  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  Reported food and pills getting stuck in her substernal region.  Also with chronic constipation.  Was on Linzess 145 mcg daily and felt that it was helpful but was still having some issues with constipation.  Her main complaint was right lower quadrant abdominal pain that was constant but dull with intermittent moderate severity.  Denied any melena or hematochezia.  Patient was concerned about diverticulitis.  She requested further evaluation of dysphagia with EGD and possible dilation.  She was tender on exam to her right lower quadrant.  CT abdomen pelvis was ordered.  She was provided samples of Linzess to 90 mcg daily.   CT abdomen pelvis performed 09/30/2021 with no acute abnormality in the abdomen or pelvis.  Did have evidence of right renal cyst and recommended follow-up imaging in 6 to 12 months (MRI preferred over CT).    Per telephone note on 10/19/2021 it was noted that patient's bowel movements were improved on Linzess 290 mcg daily but she continued to have lower abdominal discomfort that was not improving and Dr.  Queen Blossom recommendations were to perform colonoscopy for further evaluation at time of EGD.  Last office visit 10/25/22.  Patient having left lower quadrant abdominal pain, previously some right lower quadrant pain.  Pain happens sporadically sometimes when trying to clean to bend over.  Pain does not last for long time and described as sharp/pinching.  Taking Linzess 290 mcg about every other day but having discomfort in between as well as lots of gas.  Reported a 7 pound weight loss in the last 4 months.  Continue to have some intermittent dysphagia but improved with pills and solids.  Patient opted for endoscopic evaluation as previously recommended by Dr. Marletta Lor.  She was scheduled for EGD and colonoscopy.  Advise MiraLAX 17 g every other day as needed along with Linzess 290.  Advised to continue Dexilant 60 mg daily and consider imaging of renal cyst in 6/12 months.  Colonoscopy 01/12/22: -Sigmoid diverticulosis -4 mm polyp in the transverse colon -Exam otherwise normal -Path: Tubular adenoma -No repeat colonoscopy due to age  EGD 01/12/2022: -Benign-appearing esophageal stenosis s/p dilation by scope -Mildly severe esophagitis with no bleeding -Many gastric polyps -Normal duodenum -Advise repeat EGD in 4 to 6 weeks for dilation -Continue Dexilant 60 mg daily -Mechanical soft diet and avoid NSAIDs  Today: Abdominal pain - Feels like a cramping/knotting feeling in her abdomen for about the last 2 weeks. Starts in left upper quadrant and went down to the LLQ and down into her leg. Feels her stomach getting full and then that's when it  is worse.   She states she went to urgent care and they gave her dicyclomine. She has been taking them every 6 hours essentially for about 6 days. It helps to calm it down some but does not help all the way to where she can get up and move around.   GERD/dysphagia - Doing pretty good. Sometimes has breakthrough symptoms based on diet. Sometimes fried eggs and  tomato's sometimes give her symptoms and sometime coffee. No chest pain. No vomiting since she stopped taking miralax. No shortness of breath. The larger pills feel like they are getting stuck in the lower esophagus.   Chronic idiopathic constipation - Goes once per day and sometimes skips a day. Still taking Linzess 290 mcg. Sometimes feels like she is not emptying all the way. One day she took a stool softener and that helped.   Wt Readings from Last 3 Encounters:  11/06/22 128 lb 12.8 oz (58.4 kg)  05/30/22 128 lb (58.1 kg)  01/12/22 139 lb (63 kg)    Current Outpatient Medications  Medication Sig Dispense Refill   albuterol (PROVENTIL HFA;VENTOLIN HFA) 108 (90 BASE) MCG/ACT inhaler Inhale 2 puffs into the lungs every 6 (six) hours as needed for wheezing or shortness of breath.      amitriptyline (ELAVIL) 10 MG tablet Take 10 mg by mouth at bedtime.      amLODipine (NORVASC) 10 MG tablet Take 10 mg by mouth daily.     aspirin EC 81 MG tablet Take 81 mg by mouth daily.     benazepril-hydrochlorthiazide (LOTENSIN HCT) 20-25 MG tablet Take 1 tablet by mouth daily.      buPROPion (WELLBUTRIN XL) 150 MG 24 hr tablet      cetirizine (ZYRTEC) 10 MG tablet Take 10 mg by mouth daily.     citalopram (CELEXA) 40 MG tablet Take 40 mg by mouth daily.       DEXILANT 60 MG capsule TAKE 1 CAPSULE BY MOUTH EVERY DAY 30 capsule 5   dicyclomine (BENTYL) 10 MG capsule 1 capsule Orally q6 hours for 30 days As needed for cramping     fluticasone (FLOVENT HFA) 110 MCG/ACT inhaler Inhale 2 puffs into the lungs 2 (two) times daily as needed (for shortness of breath).      levothyroxine (SYNTHROID, LEVOTHROID) 50 MCG tablet Take 50 mcg by mouth daily before breakfast.      linaclotide (LINZESS) 290 MCG CAPS capsule Take 1 capsule (290 mcg total) by mouth daily before breakfast. 30 capsule 3   LORazepam (ATIVAN) 1 MG tablet Take 1 mg by mouth 2 (two) times daily as needed for anxiety.      montelukast (SINGULAIR)  10 MG tablet Take 10 mg by mouth daily.     potassium chloride (KLOR-CON) 20 MEQ packet Take 20 mEq by mouth daily.     pravastatin (PRAVACHOL) 20 MG tablet Take 20 mg by mouth daily.     sucralfate (CARAFATE) 1 g tablet Take 1 tablet (1 g total) by mouth 4 (four) times daily -  with meals and at bedtime for 14 days. (Patient not taking: Reported on 11/06/2022) 56 tablet 0   No current facility-administered medications for this visit.    Past Medical History:  Diagnosis Date   Acid reflux    Anxiety    Arthritis    Asthma    Constipation    COPD (chronic obstructive pulmonary disease) (HCC)    Depression    Gout    Heart murmur  Hx of pyloric stenosis 11/14/2011   Hypercholesteremia    Hyperglycemia    Hypertension    Hypothyroidism    Renal disorder    pelvic kidney   Retinitis pigmentosa    S/P colonoscopy 2003   Dr. Maryruth Bun: normal colon, normal path.    Stomach ulcer    Vitamin D deficiency disease     Past Surgical History:  Procedure Laterality Date   ABDOMINAL HYSTERECTOMY     complete   BREAST SURGERY     breast biopsy    COLONOSCOPY  03/09/2011   WUJ:WJXBJYN diverticula, small internal hemorrhoids.    COLONOSCOPY WITH PROPOFOL N/A 01/12/2022   Procedure: COLONOSCOPY WITH PROPOFOL;  Surgeon: Lanelle Bal, DO;  [facility];  Service: Endoscopy;  Laterality: N/A;  12:30 pm   ESOPHAGOGASTRODUODENOSCOPY  03/09/2011   WGN:FAOZHYQMV in the distal esophagus/ Stenosis at the pylorus/ Mild gastritis/ Hiatal hernia. gastric bx showed gastritis but no H.pylori   ESOPHAGOGASTRODUODENOSCOPY  03/20/2011   SLF: stricture distal esophgus and stenosis at pyloris likely from NSAIDs. s/p dialtion.    ESOPHAGOGASTRODUODENOSCOPY N/A 01/24/2017   Procedure: ESOPHAGOGASTRODUODENOSCOPY (EGD);  Surgeon: West Bali, MD;  [facility];  Service: Endoscopy;  Laterality: N/A;  10:45am   ESOPHAGOGASTRODUODENOSCOPY N/A 02/08/2017   Procedure:  ESOPHAGOGASTRODUODENOSCOPY (EGD);  Surgeon: West Bali, MD;  [facility];  Service: Endoscopy;  Laterality: N/A;  12:45pm   ESOPHAGOGASTRODUODENOSCOPY N/A 04/12/2017   Procedure: ESOPHAGOGASTRODUODENOSCOPY (EGD);  Surgeon: West Bali, MD;  [facility];  Service: Endoscopy;  Laterality: N/A;  1:30pm   ESOPHAGOGASTRODUODENOSCOPY (EGD) WITH ESOPHAGEAL DILATION  11/23/2011   SLF: Stricture was found at the gastroesophageal junction/ Small hiatal hernia/  Sessile polyp was found in the gastric body and gastric fundus/ Stenosis was found at the pylorus/ The duodenal mucosa showed no abnormalities in the bulb and second portion of the duodenum   ESOPHAGOGASTRODUODENOSCOPY (EGD) WITH ESOPHAGEAL DILATION  12/03/2011   HQI:ONGEX was found in the entire examined stomach/Stricture was found at the gastroesophageal junction   ESOPHAGOGASTRODUODENOSCOPY (EGD) WITH PROPOFOL N/A 01/12/2022   Procedure: ESOPHAGOGASTRODUODENOSCOPY (EGD) WITH PROPOFOL;  Surgeon: Lanelle Bal, DO;  [facility];  Service: Endoscopy;  Laterality: N/A;   LAPAROSCOPY  01/22/1998   [place]-stomach pain-pt reports negative   POLYPECTOMY  01/12/2022   Procedure: POLYPECTOMY;  Surgeon: Lanelle Bal, DO;  [facility];  Service: Endoscopy;;   ROTATOR CUFF REPAIR Right    SAVORY DILATION N/A 01/24/2017   Procedure: SAVORY DILATION;  Surgeon: West Bali, MD;  [facility];  Service: Endoscopy;  Laterality: N/A;   SAVORY DILATION N/A 02/08/2017   Procedure: SAVORY DILATION;  Surgeon: West Bali, MD;  [facility];  Service: Endoscopy;  Laterality: N/A;   SAVORY DILATION N/A 04/12/2017   Procedure: SAVORY DILATION;  Surgeon: West Bali, MD;  [facility];  Service: Endoscopy;  Laterality: N/A;    Family History  Problem Relation Age of Onset   Kidney disease Sister    Cirrhosis Brother        ?etoh   Diabetes Brother     Heart attack Brother    Heart disease Mother    Heart attack Mother    Heart disease Father    Heart attack Father    Diabetes Brother    Cirrhosis Sister        Non-etoh   Colon cancer Neg Hx  Allergies as of 11/06/2022 - Review Complete 11/06/2022  Allergen Reaction Noted   Ivp dye [iodinated contrast media] Hives, Itching, and Other (See Comments) 01/06/2011   Nifedipine Nausea And Vomiting and Other (See Comments) 03/02/2014   Sulfa antibiotics Rash 02/15/2020    Social History   Socioeconomic History   Marital status: Married    Spouse name: Not on file   Number of children: 3   Years of education: Not on file   Highest education level: Not on file  Occupational History   Occupation: retired Designer, fashion/clothing  Tobacco Use   Smoking status: Never   Smokeless tobacco: Never  Vaping Use   Vaping status: Never Used  Substance and Sexual Activity   Alcohol use: No   Drug use: No   Sexual activity: Not Currently  Other Topics Concern   Not on file  Social History Narrative   Not on file   Social Determinants of Health   Financial Resource Strain: Not on file  Food Insecurity: Not on file  Transportation Needs: Not on file  Physical Activity: Not on file  Stress: Not on file  Social Connections: Not on file     Review of Systems   Gen: Denies fever, chills, anorexia. Denies fatigue, weakness, weight loss.  CV: Denies chest pain, palpitations, syncope, peripheral edema, and claudication. Resp: Denies dyspnea at rest, cough, wheezing, coughing up blood, and pleurisy. GI: See HPI Derm: Denies rash, itching, dry skin Psych: Denies depression, anxiety, memory loss, confusion. No homicidal or suicidal ideation.  Heme: Denies bruising, bleeding, and enlarged lymph nodes.   Physical Exam   BP 138/71 (BP Location: Right Arm, Patient Position: Sitting, Cuff Size: Normal)   Pulse 68   Temp 97.7 F (36.5 C) (Temporal)   Ht 5\' 2"  (1.575 m)   Wt 128 lb 12.8 oz (58.4  kg)   SpO2 98%   BMI 23.56 kg/m   General:   Alert and oriented. No distress noted. Pleasant and cooperative.  Head:  Normocephalic and atraumatic. Eyes:  Conjuctiva clear without scleral icterus. Mouth:  Oral mucosa pink and moist. Good dentition. No lesions. Lungs:  Clear to auscultation bilaterally. No wheezes, rales, or rhonchi. No distress.  Heart:  S1, S2 present without murmurs appreciated.  Abdomen:  +BS, soft, non-distended. Ttp to epigastrium and across lower abdomen. No rebound or guarding. No HSM or masses noted. Rectal: deferred Msk:  Symmetrical without gross deformities. Normal posture. Extremities:  Without edema. Neurologic:  Alert and  oriented x4 Psych:  Alert and cooperative. Normal mood and affect.   Assessment  Kelly Hebert is a 82 y.o. female with a history of anxiety, depression, asthma, endometrial cancer, GERD, COPD, hypothyroidism, HLD, and vitamin D deficiency presenting today with complaint of ongoing abdominal pain.   Abdominal pain, epigastric and lower/weight loss: Previously having lower abdominal pain felt to be chronic.  Prior CT of the abdomen pelvis with contrast revealed a renal cyst but otherwise no acute abnormality. Currently having pain post prandially and has lost about 10 pounds since her last office visit.  She is feeling some early satiety and a slight lack of appetite.  She has been under some increased stress given she is caring for her husband who has dementia and this could be contributing a lot to her symptoms.  Constipation not well-controlled currently either which could be contributing to her lower abdominal pain but does not necessarily explain postprandial component.  We will further evaluate for mesenteric ischemia with CTA  and will premedicate per protocol given contrast allergy.  GERD/dysphagia: Denies any abdominal burning, states her traditional reflux symptoms are well-controlled with Dexilant 60 mg daily.  Will have  breakthrough if she eats the trigger foods.  Still has some intermittent pill dysphagia feel like it gets stuck at the bottom of her chest.  Last EGD in December 2023 with stenosis and was disrupted with endoscope, recommended repeat EGD for dilation in the future.  Discussed this with patient today and and advised her to give this some thought if she is having worsening issues.  Chronic idiopathic constipation: On Linzess 290 mcg daily, sometimes has been taking this twice daily.  Taking stool softener as needed and continuing to have intermittent issues with constipation.  Continues to have lower abdominal discomfort as well.  Did not tolerate MiraLAX as this has caused vomiting in the past.  For now advised her to continue Linzess 290 mcg only once daily and recommend a stool softener daily instead of as needed.  To help with bowel movements we will trial Motegrity in addition to her Linzess as she would prefer not to switch medication currently.  If cost is an issue then we may consider changing Linzess to Amitiza.  Renal cyst: 4.4 x 5.1 cm cyst in the midpole of the right kidney on prior CT of the abdomen pelvis and was recommended to have follow-up imaging in 6/12 months with CT or MRI, MRI preferred.  Given the concern for mesenteric ischemia we are evaluating with a repeat CT scan and hopefully will be sufficient for surveillance.  PLAN   CTA A/P, needs premedication with prednisone and Benadryl per protocol Continue dicyclomine Continue Linzess 290 mcg daily Motegrity 2 mg once daily Continue stool softener, start taking daily.  Continue dexilant 60 mg daily Carafate 1 g 4 times daily for 2 weeks. Consider EGD for dilation High-protein diet, smaller meals more frequently throughout the day. Follow up in 8 weeks    Brooke Bonito, MSN, FNP-BC, AGACNP-BC [facility]

## 2022-11-06 ENCOUNTER — Encounter: Payer: Self-pay | Admitting: Gastroenterology

## 2022-11-06 ENCOUNTER — Encounter: Payer: Self-pay | Admitting: *Deleted

## 2022-11-06 ENCOUNTER — Other Ambulatory Visit: Payer: Self-pay | Admitting: *Deleted

## 2022-11-06 ENCOUNTER — Ambulatory Visit: Payer: Medicare Other | Admitting: Gastroenterology

## 2022-11-06 ENCOUNTER — Telehealth: Payer: Self-pay | Admitting: *Deleted

## 2022-11-06 VITALS — BP 138/71 | HR 68 | Temp 97.7°F | Ht 62.0 in | Wt 128.8 lb

## 2022-11-06 DIAGNOSIS — R1030 Lower abdominal pain, unspecified: Secondary | ICD-10-CM | POA: Diagnosis not present

## 2022-11-06 DIAGNOSIS — R634 Abnormal weight loss: Secondary | ICD-10-CM

## 2022-11-06 DIAGNOSIS — R1013 Epigastric pain: Secondary | ICD-10-CM

## 2022-11-06 DIAGNOSIS — K219 Gastro-esophageal reflux disease without esophagitis: Secondary | ICD-10-CM | POA: Diagnosis not present

## 2022-11-06 DIAGNOSIS — R1319 Other dysphagia: Secondary | ICD-10-CM

## 2022-11-06 DIAGNOSIS — N281 Cyst of kidney, acquired: Secondary | ICD-10-CM

## 2022-11-06 DIAGNOSIS — K5904 Chronic idiopathic constipation: Secondary | ICD-10-CM

## 2022-11-06 MED ORDER — PREDNISONE 50 MG PO TABS: ORAL_TABLET | ORAL | 0 refills | Status: DC

## 2022-11-06 MED ORDER — MOTEGRITY 2 MG PO TABS: 2.0000 mg | ORAL_TABLET | Freq: Every day | ORAL | 1 refills | Status: DC

## 2022-11-06 MED ORDER — SUCRALFATE 1 G PO TABS: 1.0000 g | ORAL_TABLET | Freq: Three times a day (TID) | ORAL | 0 refills | Status: DC

## 2022-11-06 NOTE — Patient Instructions (Addendum)
We are scheduling you for a CTA of your abdomen and pelvis in the near future.  This is a further evaluate your weight loss and abdominal pain.  Continue Linzess 290 mcg daily.  I will also send in Motegrity, this may need a prior authorization so this may take some time.  You will take this in addition to your Linzess.  For now continue taking stool softeners and when she does not taking it daily.  If you began having worsening diarrhea please let me know.  For now for abdominal pain you can continue to use dicyclomine however this may have been making her constipation worse.  Please monitor for any confusion, involuntary movements, or headaches.  If you continue to have issues with swallowing I would like to consider repeat upper endoscopy to dilate the previously known stricture/stenosis please let me know and we can get you scheduled.  Continue to try to eat frequent small meals throughout the day and focus on protein intake.  I will see you for follow-up again in about 8 weeks.  It was a pleasure to see you today. I want to create trusting relationships with patients. If you receive a survey regarding your visit,  I greatly appreciate you taking time to fill this out on paper or through your MyChart. I value your feedback.  Brooke Bonito, MSN, FNP-BC, AGACNP-BC [facility]

## 2022-11-06 NOTE — Telephone Encounter (Signed)
Pt informed of CT appt date time and instructions. Went over how to take prednisone and benadryl prior to Ct. She states if she has any questions she will call back.

## 2022-11-06 NOTE — Addendum Note (Signed)
Addended by: Elinor Dodge on: 11/06/2022 12:59 PM   Modules accepted: Orders

## 2022-11-06 NOTE — Telephone Encounter (Signed)
[employer] PA: CPT Code 16109 Description: CT ANGIO ABD&PELV W/O&W/DYE Case Number: [id number] Review Date: 11/06/2022 10:08:56 AM Expiration Date: N/A Status: This member's benefit plan did not require a prior authorization for this request.

## 2022-11-06 NOTE — Telephone Encounter (Signed)
[facility]  CT scheduled for Friday,12/07/22, arrive at 4:30 pm to check in at [facility].    Pt needs to be pre-medicated prior to CT  due to contrast allergy. Can ask pt if she would prefer to go to another facility for sooner appt.

## 2022-11-09 ENCOUNTER — Other Ambulatory Visit: Payer: Self-pay | Admitting: Gastroenterology

## 2022-11-09 DIAGNOSIS — K5904 Chronic idiopathic constipation: Secondary | ICD-10-CM

## 2022-11-17 ENCOUNTER — Other Ambulatory Visit: Payer: Self-pay | Admitting: Cardiology

## 2022-12-07 ENCOUNTER — Ambulatory Visit (HOSPITAL_COMMUNITY)
Admission: RE | Admit: 2022-12-07 | Discharge: 2022-12-07 | Disposition: A | Discharge status: Home / Self Care | Payer: Medicare Other | Source: Ambulatory Visit | Attending: Gastroenterology | Admitting: Gastroenterology

## 2022-12-07 DIAGNOSIS — R634 Abnormal weight loss: Secondary | ICD-10-CM | POA: Diagnosis present

## 2022-12-07 DIAGNOSIS — R1013 Epigastric pain: Secondary | ICD-10-CM | POA: Insufficient documentation

## 2022-12-07 DIAGNOSIS — R1030 Lower abdominal pain, unspecified: Secondary | ICD-10-CM | POA: Diagnosis present

## 2022-12-07 MED ORDER — IOHEXOL 300 MG/ML  SOLN: 100.0000 mL | Freq: Once | INTRAMUSCULAR | Status: DC | PRN

## 2022-12-07 MED ORDER — IOHEXOL 350 MG/ML SOLN
100.0000 mL | Freq: Once | INTRAVENOUS | Status: AC | PRN
  Administered 2022-12-07: 100 mL via INTRAVENOUS

## 2023-01-01 ENCOUNTER — Ambulatory Visit: Payer: Medicare Other | Admitting: Gastroenterology

## 2023-01-07 ENCOUNTER — Emergency Department (HOSPITAL_COMMUNITY): Payer: Medicare Other

## 2023-01-07 ENCOUNTER — Other Ambulatory Visit: Payer: Self-pay

## 2023-01-07 ENCOUNTER — Encounter (HOSPITAL_COMMUNITY): Payer: Self-pay | Admitting: Emergency Medicine

## 2023-01-07 ENCOUNTER — Emergency Department (HOSPITAL_COMMUNITY)
Admission: EM | Admit: 2023-01-07 | Discharge: 2023-01-07 | Disposition: A | Discharge status: Home / Self Care | Payer: Medicare Other | Attending: Emergency Medicine | Admitting: Emergency Medicine

## 2023-01-07 DIAGNOSIS — Z7951 Long term (current) use of inhaled steroids: Secondary | ICD-10-CM | POA: Diagnosis not present

## 2023-01-07 DIAGNOSIS — J45909 Unspecified asthma, uncomplicated: Secondary | ICD-10-CM | POA: Diagnosis not present

## 2023-01-07 DIAGNOSIS — Z7982 Long term (current) use of aspirin: Secondary | ICD-10-CM | POA: Insufficient documentation

## 2023-01-07 DIAGNOSIS — I10 Essential (primary) hypertension: Secondary | ICD-10-CM | POA: Insufficient documentation

## 2023-01-07 DIAGNOSIS — E039 Hypothyroidism, unspecified: Secondary | ICD-10-CM | POA: Diagnosis not present

## 2023-01-07 DIAGNOSIS — R1012 Left upper quadrant pain: Secondary | ICD-10-CM | POA: Insufficient documentation

## 2023-01-07 DIAGNOSIS — J449 Chronic obstructive pulmonary disease, unspecified: Secondary | ICD-10-CM | POA: Insufficient documentation

## 2023-01-07 DIAGNOSIS — R0789 Other chest pain: Secondary | ICD-10-CM | POA: Insufficient documentation

## 2023-01-07 DIAGNOSIS — Z79899 Other long term (current) drug therapy: Secondary | ICD-10-CM | POA: Diagnosis not present

## 2023-01-07 LAB — CBC WITH DIFFERENTIAL/PLATELET
Abs Immature Granulocytes: 0.02 10*3/uL (ref 0.00–0.07)
Basophils Absolute: 0.1 10*3/uL (ref 0.0–0.1)
Basophils Relative: 1 %
Eosinophils Absolute: 0.3 10*3/uL (ref 0.0–0.5)
Eosinophils Relative: 3 %
HCT: 38.6 % (ref 36.0–46.0)
Hemoglobin: 12.4 g/dL (ref 12.0–15.0)
Immature Granulocytes: 0 %
Lymphocytes Relative: 32 %
Lymphs Abs: 2.8 10*3/uL (ref 0.7–4.0)
MCH: 30 pg (ref 26.0–34.0)
MCHC: 32.1 g/dL (ref 30.0–36.0)
MCV: 93.2 fL (ref 80.0–100.0)
Monocytes Absolute: 0.8 10*3/uL (ref 0.1–1.0)
Monocytes Relative: 9 %
Neutro Abs: 4.8 10*3/uL (ref 1.7–7.7)
Neutrophils Relative %: 55 %
Platelets: 253 10*3/uL (ref 150–400)
RBC: 4.14 MIL/uL (ref 3.87–5.11)
RDW: 11.9 % (ref 11.5–15.5)
WBC: 8.7 10*3/uL (ref 4.0–10.5)
nRBC: 0 % (ref 0.0–0.2)

## 2023-01-07 LAB — COMPREHENSIVE METABOLIC PANEL
ALT: 20 U/L (ref 0–44)
AST: 23 U/L (ref 15–41)
Albumin: 4.5 g/dL (ref 3.5–5.0)
Alkaline Phosphatase: 64 U/L (ref 38–126)
Anion gap: 7 (ref 5–15)
BUN: 17 mg/dL (ref 8–23)
CO2: 28 mmol/L (ref 22–32)
Calcium: 10.2 mg/dL (ref 8.9–10.3)
Chloride: 103 mmol/L (ref 98–111)
Creatinine, Ser: 1.07 mg/dL — ABNORMAL HIGH (ref 0.44–1.00)
GFR, Estimated: 52 mL/min — ABNORMAL LOW (ref >60)
Glucose, Bld: 101 mg/dL — ABNORMAL HIGH (ref 70–99)
Potassium: 4.3 mmol/L (ref 3.5–5.1)
Sodium: 138 mmol/L (ref 135–145)
Total Bilirubin: 0.7 mg/dL (ref <1.2)
Total Protein: 7.9 g/dL (ref 6.5–8.1)

## 2023-01-07 LAB — LIPASE, BLOOD: Lipase: 35 U/L (ref 11–51)

## 2023-01-07 LAB — TROPONIN I (HIGH SENSITIVITY)
Troponin I (High Sensitivity): 5 ng/L (ref <18)
Troponin I (High Sensitivity): 6 ng/L (ref <18)

## 2023-01-07 NOTE — ED Notes (Signed)
Pt back from CT

## 2023-01-07 NOTE — ED Notes (Signed)
Patient transported to CT 

## 2023-01-07 NOTE — Discharge Instructions (Signed)
Follow-up with your doctors.  There is evidence of a herniated disc on the left side that would normally cause back pain and could cause some pain into your left leg.  No findings to explain the left lower anterior chest pain or the left upper quadrant abdominal pain.  Return for any new or worse symptoms.

## 2023-01-07 NOTE — ED Triage Notes (Signed)
Pt reports chest/abdominal pain under left breast. She thinks it could be related to food she is not supposed to eat. Pt reports acid reflux and a stomach ulcer. She has medication for acid reflux but does not remember to take it before she eats every time.

## 2023-01-07 NOTE — ED Provider Notes (Addendum)
[place] EMERGENCY DEPARTMENT AT [facility] Provider Note   CSN: [id number] Arrival date & time: 01/07/23  1701     History  Chief Complaint  Patient presents with   Abdominal Pain   Chest Pain    Kelly Hebert is a 82 y.o. female.  Patient with a complaint of left anterior lower chest discomfort.  That started at 5 this morning.  It has waxed and waned since that time.  She is concerned that maybe it could be acid reflux or stomach ulcer.  Patient does take medication for acid reflux.  Patient lives at home with her husband that has dementia and she is the primary caregiver.  Patient will questionable dye allergy listed but she says she has had CT scans since that time with dye without a problem.  Patient is on Carafate.  Past medical history significant for high cholesterol hypertension COPD hypothyroidism history of asthma.  Patient's had an abdominal hysterectomy patient is never used tobacco products.  Patient last seen in the emergency department in February 2020 for upper respiratory infection.  Patient's primary care doctor is [facility].       Home Medications Prior to Admission medications   Medication Sig Start Date End Date Taking? Authorizing Provider  albuterol (PROVENTIL HFA;VENTOLIN HFA) 108 (90 BASE) MCG/ACT inhaler Inhale 2 puffs into the lungs every 6 (six) hours as needed for wheezing or shortness of breath.     [provider]  amitriptyline (ELAVIL) 10 MG tablet Take 10 mg by mouth at bedtime.  07/29/12   [provider]  amLODipine (NORVASC) 10 MG tablet Take 10 mg by mouth daily.    [provider]  aspirin EC 81 MG tablet Take 81 mg by mouth daily.    [provider]  benazepril-hydrochlorthiazide (LOTENSIN HCT) 20-25 MG tablet Take 1 tablet by mouth daily.  10/03/11   [provider]  buPROPion (WELLBUTRIN XL) 150 MG 24 hr tablet  04/01/19   [provider]  cetirizine  (ZYRTEC) 10 MG tablet Take 10 mg by mouth daily.    [provider]  citalopram (CELEXA) 40 MG tablet Take 40 mg by mouth daily.      [provider]  DEXILANT 60 MG capsule TAKE 1 CAPSULE BY MOUTH EVERY DAY 02/21/20   Anice Paganini, NP  dicyclomine (BENTYL) 10 MG capsule 1 capsule Orally q6 hours for 30 days As needed for cramping 11/01/22   [provider]  fluticasone (FLOVENT HFA) 110 MCG/ACT inhaler Inhale 2 puffs into the lungs 2 (two) times daily as needed (for shortness of breath).     [provider]  levothyroxine (SYNTHROID, LEVOTHROID) 50 MCG tablet Take 50 mcg by mouth daily before breakfast.  04/10/12   [provider]  LINZESS 290 MCG CAPS capsule TAKE 1 CAPSULE(290 MCG) BY MOUTH DAILY BEFORE BREAKFAST 11/12/22   Mahon, Courtney L, NP  LORazepam (ATIVAN) 1 MG tablet Take 1 mg by mouth 2 (two) times daily as needed for anxiety.     [provider]  montelukast (SINGULAIR) 10 MG tablet Take 10 mg by mouth daily. 09/01/21   [provider]  potassium chloride (KLOR-CON) 20 MEQ packet Take 20 mEq by mouth daily.    [provider]  pravastatin (PRAVACHOL) 20 MG tablet Take 20 mg by mouth daily.    [provider]  predniSONE (DELTASONE) 50 MG tablet Take 1 tablet 13 hours prior to scan, 7 hours  prior to scan and 1 hour prior to scan. 11/06/22   Aida Raider, NP  Prucalopride Succinate (MOTEGRITY) 2 MG TABS Take 1 tablet (2 mg total) by mouth daily. 11/06/22   Aida Raider, NP  sucralfate (CARAFATE) 1 g tablet Take 1 tablet (1 g total) by mouth 4 (four) times daily -  with meals and at bedtime for 14 days. 11/06/22 11/20/22  Aida Raider, NP      Allergies    Ivp dye [iodinated contrast media], Nifedipine, and Sulfa antibiotics    Review of Systems   Review of Systems  Constitutional:  Negative for chills and fever.  HENT:  Negative for ear pain and sore throat.   Eyes:  Negative for pain and  visual disturbance.  Respiratory:  Negative for cough and shortness of breath.   Cardiovascular:  Positive for chest pain. Negative for palpitations.  Gastrointestinal:  Positive for abdominal pain. Negative for vomiting.  Genitourinary:  Negative for dysuria and hematuria.  Musculoskeletal:  Negative for arthralgias and back pain.  Skin:  Negative for color change and rash.  Neurological:  Negative for seizures and syncope.  All other systems reviewed and are negative.   Physical Exam Updated Vital Signs BP (!) 151/83   Pulse 71   Resp 16   Ht 1.575 m (5\' 2" )   Wt 61.7 kg   SpO2 99%   BMI 24.87 kg/m  Physical Exam Vitals and nursing note reviewed.  Constitutional:      General: She is not in acute distress.    Appearance: Normal appearance. She is well-developed.  HENT:     Head: Normocephalic and atraumatic.  Eyes:     Extraocular Movements: Extraocular movements intact.     Conjunctiva/sclera: Conjunctivae normal.     Pupils: Pupils are equal, round, and reactive to light.  Cardiovascular:     Rate and Rhythm: Normal rate and regular rhythm.     Heart sounds: No murmur heard. Pulmonary:     Effort: Pulmonary effort is normal. No respiratory distress.     Breath sounds: Normal breath sounds. No wheezing, rhonchi or rales.  Chest:     Chest wall: No tenderness.  Abdominal:     General: There is no distension.     Palpations: Abdomen is soft.     Tenderness: There is no abdominal tenderness. There is no guarding.  Musculoskeletal:        General: No swelling.     Cervical back: Normal range of motion and neck supple.     Right lower leg: No edema.     Left lower leg: No edema.  Skin:    General: Skin is warm and dry.     Capillary Refill: Capillary refill takes less than 2 seconds.  Neurological:     General: No focal deficit present.     Mental Status: She is alert and oriented to person, place, and time.  Psychiatric:        Mood and Affect: Mood normal.      ED Results / Procedures / Treatments   Labs (all labs ordered are listed, but only abnormal results are displayed) Labs Reviewed  COMPREHENSIVE METABOLIC PANEL - Abnormal; Notable for the following components:      Result Value   Glucose, Bld 101 (*)    Creatinine, Ser 1.07 (*)    GFR, Estimated 52 (*)    All other components within normal limits  CBC WITH DIFFERENTIAL/PLATELET  LIPASE, BLOOD  TROPONIN  I (HIGH SENSITIVITY)  TROPONIN I (HIGH SENSITIVITY)    EKG EKG Interpretation Date/Time:  Monday January 07 2023 18:59:19 EST Ventricular Rate:  71 PR Interval:  128 QRS Duration:  81 QT Interval:  427 QTC Calculation: 464 R Axis:   35  Text Interpretation: Sinus rhythm Low voltage, precordial leads Borderline T wave abnormalities Confirmed by Vanetta Mulders [phone]) on 01/07/2023 7:02:09 PM  Radiology CT ABDOMEN PELVIS WO CONTRAST Result Date: 01/07/2023 CLINICAL DATA:  Acute nonlocalized abdominal pain EXAM: CT ABDOMEN AND PELVIS WITHOUT CONTRAST TECHNIQUE: Multidetector CT imaging of the abdomen and pelvis was performed following the standard protocol without IV contrast. RADIATION DOSE REDUCTION: This exam was performed according to the departmental dose-optimization program which includes automated exposure control, adjustment of the mA and/or kV according to patient size and/or use of iterative reconstruction technique. COMPARISON:  12/07/2022 FINDINGS: Lower chest: No acute abnormality. Hepatobiliary: No focal liver abnormality is seen. No gallstones, gallbladder wall thickening, or biliary dilatation. Pancreas: Unremarkable Spleen: Unremarkable Adrenals/Urinary Tract: The adrenal glands are unremarkable. The left kidney is ectopically position within the left hemipelvis is normal in size. Right kidney is normally positioned within the right renal fossa. 5 cm minimally complex cystic lesion is seen within the interpolar region of the right kidney demonstrating a single  thin calcified septum, best characterized as a Bosniak class 2 cyst for which no follow-up imaging is recommended. There is no hydronephrosis. No intrarenal or ureteral urinary calculi are identified. No perinephric inflammatory stranding or fluid collections are seen. The bladder is unremarkable. Stomach/Bowel: Moderate colonic stool burden. The stomach, small bowel, and large bowel are otherwise unremarkable there is no evidence of obstruction or focal inflammation. Appendix normal. No free intraperitoneal gas or fluid. Vascular/Lymphatic: Aortic atherosclerosis. No enlarged abdominal or pelvic lymph nodes. Reproductive: Uterus and bilateral adnexa are unremarkable. Other: No abdominal wall hernia or abnormality. No abdominopelvic ascites. Musculoskeletal: Advanced asymmetric degenerative disc disease is seen at L2-3 with a large posterior disc herniation at this level resulting in moderate central canal stenosis and marked stenosis of the left lateral recess with almost certain impingement of the crossing left L3 nerve root. The moderate facet arthrosis noted at L4-5 with associated grade 1 anterolisthesis. No acute bone abnormality. No lytic or blastic bone lesion. IMPRESSION: 1. No acute intra-abdominal pathology identified. No definite radiographic explanation for the patient's reported symptoms. 2. Moderate colonic stool burden. 3. 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 Aortic Atherosclerosis (ICD10-I70.0). Electronically Signed   By: Helyn Numbers M.D.   On: 01/07/2023 23:21   DG Chest Port 1 View Result Date: 01/07/2023 CLINICAL DATA:  Chest/abdominal pain under the left breast. EXAM: PORTABLE CHEST 1 VIEW COMPARISON:  08/25/2017 FINDINGS: Stable cardiomediastinal silhouette. No  focal consolidation, pleural effusion, or pneumothorax. No displaced rib fractures. IMPRESSION: No active disease. Electronically Signed   By: Minerva Fester M.D.   On: 01/07/2023 20:50    Procedures Procedures    Medications Ordered in ED Medications - No data to display  ED Course/ Medical Decision Making/ A&P                                 Medical Decision Making Amount and/or Complexity of Data Reviewed Labs: ordered.  Radiology: ordered.   Patient nontoxic no acute distress.  CBC is back white count 8.7 hemoglobin 12.4 platelets are 253.  Complete metabolic panel lipase and troponins are pending portable chest x-ray results pending.  EKG without any acute findings.  Patient's workup here troponins x 2 without any significant delta change.  CBC no leukocytosis hemoglobin is good.  Complete metabolic panel liver function test are normal.  GFR of 52 creatinine up a little bit at 1.07 anion gap normal lipase normal.  Portable chest x-ray without any active disease.  Because of the discomfort in the left anterior lower chest upper abdomen area CT scan was done without contrast because she has questionable dye allergy.  Results of that are still pending.  Patient nontoxic no acute distress.  CT scan without any acute findings.  There is a right renal cyst.  But they say no further imaging is needed.  There is evidence of some moderate central canal stenosis left lateral recess with almost some impingement of crossing left L3 nerve root.  But patient without symptoms consistent with that.  Her pain is mostly anterior.  Based on this patient is stable for discharge home and follow-up with her doctors.  Patient nontoxic no acute distress.  Final Clinical Impression(s) / ED Diagnoses Final diagnoses:  Atypical chest pain  Left upper quadrant abdominal pain    Rx / DC Orders ED Discharge Orders     None         Vanetta Mulders, MD 01/07/23 2012    Vanetta Mulders,  MD 01/07/23 3244    Vanetta Mulders, MD 01/07/23 2328

## 2023-01-14 ENCOUNTER — Other Ambulatory Visit: Payer: Self-pay | Admitting: Gastroenterology

## 2023-02-26 NOTE — Progress Notes (Deleted)
 Referring Physician:  Fessenden Morna FALCON, NP [address] [place],  [place] [address]  Primary Physician:  Ramsaran Morna FALCON, NP  History of Present Illness: 02/26/2023 Kelly Hebert is here today with a chief complaint of ***  Low back pain radiating down left leg?   Duration: *** Location: *** Quality: *** Severity: ***  Precipitating: aggravated by *** Modifying factors: made better by *** Weakness: none Timing: *** Bowel/Bladder Dysfunction: none  Conservative measures:  Physical therapy: *** has not participated in PT Multimodal medical therapy including regular antiinflammatories: *** Prednisone  Injections: no epidural steroid injections  Past Surgery: ***none  Kelly Hebert has ***no symptoms of cervical myelopathy.  The symptoms are causing a significant impact on the patient's life.   Review of Systems:  A 10 point review of systems is negative, except for the pertinent positives and negatives detailed in the HPI.  Past Medical History: Past Medical History:  Diagnosis Date   Acid reflux    Anxiety    Arthritis    Asthma    Constipation    COPD (chronic obstructive pulmonary disease) (HCC)    Depression    Gout    Heart murmur    Hx of pyloric stenosis 11/14/2011   Hypercholesteremia    Hyperglycemia    Hypertension    Hypothyroidism    Renal disorder    pelvic kidney   Retinitis pigmentosa    S/P colonoscopy 2003   Dr. Chipper: normal colon, normal path.    Stomach ulcer    Vitamin D deficiency disease     Past Surgical History: Past Surgical History:  Procedure Laterality Date   ABDOMINAL HYSTERECTOMY     complete   BREAST SURGERY     breast biopsy    COLONOSCOPY  03/09/2011   DOQ:dphfnpi diverticula, small internal hemorrhoids.    COLONOSCOPY WITH PROPOFOL  N/A 01/12/2022   Procedure: COLONOSCOPY WITH PROPOFOL ;  Surgeon: Cindie Carlin POUR, DO;  [facility]: [facility];  Service: Endoscopy;  Laterality: N/A;  12:30 pm    ESOPHAGOGASTRODUODENOSCOPY  03/09/2011   DOQ:Dumprulmz in the distal esophagus/ Stenosis at the pylorus/ Mild gastritis/ Hiatal hernia. gastric bx showed gastritis but no H.pylori   ESOPHAGOGASTRODUODENOSCOPY  03/20/2011   SLF: stricture distal esophgus and stenosis at pyloris likely from NSAIDs. s/p dialtion.    ESOPHAGOGASTRODUODENOSCOPY N/A 01/24/2017   Procedure: ESOPHAGOGASTRODUODENOSCOPY (EGD);  Surgeon: Harvey Margo CROME, MD;  [facility]: [facility];  Service: Endoscopy;  Laterality: N/A;  10:45am   ESOPHAGOGASTRODUODENOSCOPY N/A 02/08/2017   Procedure: ESOPHAGOGASTRODUODENOSCOPY (EGD);  Surgeon: Harvey Margo CROME, MD;  [facility]: [facility];  Service: Endoscopy;  Laterality: N/A;  12:45pm   ESOPHAGOGASTRODUODENOSCOPY N/A 04/12/2017   Procedure: ESOPHAGOGASTRODUODENOSCOPY (EGD);  Surgeon: Harvey Margo CROME, MD;  [facility]: [facility];  Service: Endoscopy;  Laterality: N/A;  1:30pm   ESOPHAGOGASTRODUODENOSCOPY (EGD) WITH ESOPHAGEAL DILATION  11/23/2011   SLF: Stricture was found at the gastroesophageal junction/ Small hiatal hernia/  Sessile polyp was found in the gastric body and gastric fundus/ Stenosis was found at the pylorus/ The duodenal mucosa showed no abnormalities in the bulb and second portion of the duodenum   ESOPHAGOGASTRODUODENOSCOPY (EGD) WITH ESOPHAGEAL DILATION  12/03/2011   DOQ:Enobe was found in the entire examined stomach/Stricture was found at the gastroesophageal junction   ESOPHAGOGASTRODUODENOSCOPY (EGD) WITH PROPOFOL  N/A 01/12/2022   Procedure: ESOPHAGOGASTRODUODENOSCOPY (EGD) WITH PROPOFOL ;  Surgeon: Cindie Carlin POUR, DO;  [facility]: [facility];  Service: Endoscopy;  Laterality: N/A;   LAPAROSCOPY  01/22/1998   Marshall-stomach pain-pt reports negative   POLYPECTOMY  01/12/2022   Procedure: POLYPECTOMY;  Surgeon: Cindie Carlin POUR, DO;  [facility]: [facility];  Service: Endoscopy;;   ROTATOR CUFF REPAIR Right    SAVORY DILATION N/A 01/24/2017   Procedure:  SAVORY DILATION;  Surgeon: Harvey Margo CROME, MD;  [facility]: [facility];  Service: Endoscopy;  Laterality: N/A;   SAVORY DILATION N/A 02/08/2017   Procedure: SAVORY DILATION;  Surgeon: Harvey Margo CROME, MD;  [facility];  Service: Endoscopy;  Laterality: N/A;   SAVORY DILATION N/A 04/12/2017   Procedure: SAVORY DILATION;  Surgeon: Harvey Margo CROME, MD;  [facility]: [facility];  Service: Endoscopy;  Laterality: N/A;    Allergies: Allergies as of 02/28/2023 - Review Complete 01/07/2023  Allergen Reaction Noted   Ivp dye [iodinated contrast media] Hives, Itching, and Other (See Comments) 01/06/2011   Nifedipine Nausea And Vomiting and Other (See Comments) 03/02/2014   Sulfa antibiotics Rash 02/15/2020    Medications: Outpatient Encounter Medications as of 02/28/2023  Medication Sig   albuterol  (PROVENTIL  HFA;VENTOLIN  HFA) 108 (90 BASE) MCG/ACT inhaler Inhale 2 puffs into the lungs every 6 (six) hours as needed for wheezing or shortness of breath.    amitriptyline (ELAVIL) 10 MG tablet Take 10 mg by mouth at bedtime.    amLODipine  (NORVASC ) 10 MG tablet Take 10 mg by mouth daily.   aspirin EC 81 MG tablet Take 81 mg by mouth daily.   benazepril -hydrochlorthiazide (LOTENSIN  HCT) 20-25 MG tablet Take 1 tablet by mouth daily.    buPROPion (WELLBUTRIN XL) 150 MG 24 hr tablet    cetirizine (ZYRTEC) 10 MG tablet Take 10 mg by mouth daily.   citalopram  (CELEXA ) 40 MG tablet Take 40 mg by mouth daily.     DEXILANT  60 MG capsule TAKE 1 CAPSULE BY MOUTH EVERY DAY   dicyclomine  (BENTYL ) 10 MG capsule 1 capsule Orally q6 hours for 30 days As needed for cramping   fluticasone  (FLOVENT  HFA) 110 MCG/ACT inhaler Inhale 2 puffs into the lungs 2 (two) times daily as needed (for shortness of breath).    levothyroxine  (SYNTHROID , LEVOTHROID) 50 MCG tablet Take 50 mcg by mouth daily before breakfast.    LINZESS  290 MCG CAPS capsule TAKE 1 CAPSULE(290 MCG) BY MOUTH DAILY BEFORE BREAKFAST   LORazepam   (ATIVAN ) 1 MG tablet Take 1 mg by mouth 2 (two) times daily as needed for anxiety.    montelukast (SINGULAIR) 10 MG tablet Take 10 mg by mouth daily.   MOTEGRITY  2 MG TABS TAKE ONE TABLET BY MOUTH ONCE DAILY   potassium chloride  (KLOR-CON ) 20 MEQ packet Take 20 mEq by mouth daily.   pravastatin (PRAVACHOL) 20 MG tablet Take 20 mg by mouth daily.   predniSONE  (DELTASONE ) 50 MG tablet Take 1 tablet 13 hours prior to scan, 7 hours prior to scan and 1 hour prior to scan.   sucralfate  (CARAFATE ) 1 g tablet Take 1 tablet (1 g total) by mouth 4 (four) times daily -  with meals and at bedtime for 14 days.   No facility-administered encounter medications on file as of 02/28/2023.    Social History: Social History   Tobacco Use   Smoking status: Never   Smokeless tobacco: Never  Vaping Use   Vaping status: Never Used  Substance Use Topics   Alcohol use: No   Drug use: No    Family Medical History: Family History  Problem Relation Age of Onset   Kidney disease  Sister    Cirrhosis Brother        ?etoh   Diabetes Brother    Heart attack Brother    Heart disease Mother    Heart attack Mother    Heart disease Father    Heart attack Father    Diabetes Brother    Cirrhosis Sister        Non-etoh   Colon cancer Neg Hx     Physical Examination: @VITALWITHPAIN @  General: Patient is well developed, well nourished, calm, collected, and in no apparent distress. Attention to examination is appropriate.  Psychiatric: Patient is non-anxious.  Head:  Pupils equal, round, and reactive to light.  ENT:  Oral mucosa appears well hydrated.  Neck:   Supple.  ***Full range of motion.  Respiratory: Patient is breathing without any difficulty.  Extremities: No edema.  Vascular: Palpable dorsal pedal pulses.  Skin:   On exposed skin, there are no abnormal skin lesions.  NEUROLOGICAL:     Awake, alert, oriented to person, place, and time.  Speech is clear and fluent. Fund of knowledge is  appropriate.   Cranial Nerves: Pupils equal round and reactive to light.  Facial tone is symmetric.  Facial sensation is symmetric.  ROM of spine: ***full.  Palpation of spine: ***non tender.    Strength: Side Biceps Triceps Deltoid Interossei Grip Wrist Ext. Wrist Flex.  R 5 5 5 5 5 5 5   L 5 5 5 5 5 5 5    Side Iliopsoas Quads Hamstring PF DF EHL  R 5 5 5 5 5 5   L 5 5 5 5 5 5    Reflexes are ***2+ and symmetric at the biceps, triceps, brachioradialis, patella and achilles.   Hoffman's is absent.  Clonus is not present.  Toes are down-going.  Bilateral upper and lower extremity sensation is intact to light touch.    Gait is normal.   No difficulty with tandem gait.   No evidence of dysmetria noted.  Medical Decision Making  Imaging: ***  I have personally reviewed the images and agree with the above interpretation.  Assessment and Plan: Kelly Hebert is a pleasant 83 y.o. female with ***    Thank you for involving me in the care of this patient.   I spent a total of *** minutes in both face-to-face and non-face-to-face activities for this visit on the date of this encounter.   [employer] Dept. of Neurosurgery

## 2023-02-28 ENCOUNTER — Ambulatory Visit: Payer: Medicare Other | Admitting: Neurosurgery

## 2023-06-19 ENCOUNTER — Emergency Department (HOSPITAL_COMMUNITY)
Admission: EM | Admit: 2023-06-19 | Discharge: 2023-06-19 | Disposition: A | Discharge status: Home / Self Care | Attending: Emergency Medicine | Admitting: Emergency Medicine

## 2023-06-19 ENCOUNTER — Other Ambulatory Visit: Payer: Self-pay

## 2023-06-19 ENCOUNTER — Encounter (HOSPITAL_COMMUNITY): Payer: Self-pay

## 2023-06-19 DIAGNOSIS — Z7952 Long term (current) use of systemic steroids: Secondary | ICD-10-CM | POA: Diagnosis not present

## 2023-06-19 DIAGNOSIS — K625 Hemorrhage of anus and rectum: Secondary | ICD-10-CM | POA: Insufficient documentation

## 2023-06-19 DIAGNOSIS — J45909 Unspecified asthma, uncomplicated: Secondary | ICD-10-CM | POA: Insufficient documentation

## 2023-06-19 DIAGNOSIS — J449 Chronic obstructive pulmonary disease, unspecified: Secondary | ICD-10-CM | POA: Insufficient documentation

## 2023-06-19 DIAGNOSIS — Z79899 Other long term (current) drug therapy: Secondary | ICD-10-CM | POA: Insufficient documentation

## 2023-06-19 DIAGNOSIS — I10 Essential (primary) hypertension: Secondary | ICD-10-CM | POA: Diagnosis not present

## 2023-06-19 DIAGNOSIS — Z7982 Long term (current) use of aspirin: Secondary | ICD-10-CM | POA: Diagnosis not present

## 2023-06-19 LAB — CBC WITH DIFFERENTIAL/PLATELET
Abs Immature Granulocytes: 0.03 10*3/uL (ref 0.00–0.07)
Basophils Absolute: 0.1 10*3/uL (ref 0.0–0.1)
Basophils Relative: 1 %
Eosinophils Absolute: 0.2 10*3/uL (ref 0.0–0.5)
Eosinophils Relative: 3 %
HCT: 41.7 % (ref 36.0–46.0)
Hemoglobin: 13.6 g/dL (ref 12.0–15.0)
Immature Granulocytes: 0 %
Lymphocytes Relative: 34 %
Lymphs Abs: 2.4 10*3/uL (ref 0.7–4.0)
MCH: 30.4 pg (ref 26.0–34.0)
MCHC: 32.6 g/dL (ref 30.0–36.0)
MCV: 93.3 fL (ref 80.0–100.0)
Monocytes Absolute: 0.6 10*3/uL (ref 0.1–1.0)
Monocytes Relative: 9 %
Neutro Abs: 3.9 10*3/uL (ref 1.7–7.7)
Neutrophils Relative %: 53 %
Platelets: 213 10*3/uL (ref 150–400)
RBC: 4.47 MIL/uL (ref 3.87–5.11)
RDW: 11.9 % (ref 11.5–15.5)
WBC: 7.2 10*3/uL (ref 4.0–10.5)
nRBC: 0 % (ref 0.0–0.2)

## 2023-06-19 LAB — COMPREHENSIVE METABOLIC PANEL WITH GFR
ALT: 15 U/L (ref 0–44)
AST: 19 U/L (ref 15–41)
Albumin: 4.1 g/dL (ref 3.5–5.0)
Alkaline Phosphatase: 65 U/L (ref 38–126)
Anion gap: 12 (ref 5–15)
BUN: 15 mg/dL (ref 8–23)
CO2: 26 mmol/L (ref 22–32)
Calcium: 10 mg/dL (ref 8.9–10.3)
Chloride: 100 mmol/L (ref 98–111)
Creatinine, Ser: 1.09 mg/dL — ABNORMAL HIGH (ref 0.44–1.00)
GFR, Estimated: 50 mL/min — ABNORMAL LOW (ref >60)
Glucose, Bld: 99 mg/dL (ref 70–99)
Potassium: 3.9 mmol/L (ref 3.5–5.1)
Sodium: 138 mmol/L (ref 135–145)
Total Bilirubin: 0.6 mg/dL (ref 0.0–1.2)
Total Protein: 7.7 g/dL (ref 6.5–8.1)

## 2023-06-19 LAB — TYPE AND SCREEN
ABO/RH(D): O POS
Antibody Screen: NEGATIVE

## 2023-06-19 LAB — LIPASE, BLOOD: Lipase: 36 U/L (ref 11–51)

## 2023-06-19 LAB — POC OCCULT BLOOD, ED

## 2023-06-19 MED ORDER — HYDROCORTISONE ACETATE 25 MG RE SUPP
25.0000 mg | Freq: Two times a day (BID) | RECTAL | 0 refills | Status: DC
Start: 2023-06-19 — End: 2023-08-07

## 2023-06-19 MED ORDER — ONDANSETRON 4 MG PO TBDP
4.0000 mg | ORAL_TABLET | Freq: Once | ORAL | Status: AC
  Administered 2023-06-19: 4 mg via ORAL
  Filled 2023-06-19: qty 1

## 2023-06-19 NOTE — Discharge Instructions (Signed)
 Your lab tests and exam are reassuring today.  I suspect the blood you saw was possibly from an internal hemorrhoid but you do not appear to continue to bleed.  I have prescribed you some medicine to treat you for this.    In the meantime,  return here if you have any worsened symptoms.  Otherwise plan to follow up with Julian Obey at your GI office for a recheck.

## 2023-06-19 NOTE — ED Provider Notes (Signed)
 [place] EMERGENCY DEPARTMENT AT [facility] Provider Note   CSN: [id number] Arrival date & time: 06/19/23  1013     History {Add pertinent medical, surgical, social history, OB history to HPI:1} Chief Complaint  Patient presents with   Rectal Bleeding    Kelly Hebert is a 83 y.o. female with history including Asthma,  HTN,  PUD/reflux, COPD presenting for evaluation of rectal bleeding. She describes having a bowel movement this which involved a small amount of burning at her rectum.  When she wiped she noticed pink tinge on the toilet paper.  She has had no further bleeding since then.  She denies constipation or straining to go and denies continued rectal pain. No current complaint of hemorrhoids. Last colonoscopy 12/23 - few diverticula, with single polyp with polypectomy.  Pt denies n/v, fever,  abd distention, lightheadedness.    The history is provided by the patient and a relative (daughter at bedside).       Home Medications Prior to Admission medications   Medication Sig Start Date End Date Taking? Authorizing Provider  albuterol  (PROVENTIL  HFA;VENTOLIN  HFA) 108 (90 BASE) MCG/ACT inhaler Inhale 2 puffs into the lungs every 6 (six) hours as needed for wheezing or shortness of breath.     [provider]  amitriptyline (ELAVIL) 10 MG tablet Take 10 mg by mouth at bedtime.  07/29/12   [provider]  amLODipine  (NORVASC ) 10 MG tablet Take 10 mg by mouth daily.    [provider]  aspirin EC 81 MG tablet Take 81 mg by mouth daily.    [provider]  benazepril -hydrochlorthiazide (LOTENSIN  HCT) 20-25 MG tablet Take 1 tablet by mouth daily.  10/03/11   [provider]  buPROPion (WELLBUTRIN XL) 150 MG 24 hr tablet  04/01/19   [provider]  cetirizine (ZYRTEC) 10 MG tablet Take 10 mg by mouth daily.    [provider]  citalopram  (CELEXA ) 40 MG tablet Take 40 mg by mouth daily.      [provider]  DEXILANT  60 MG capsule TAKE 1 CAPSULE BY MOUTH EVERY DAY 02/21/20   Iola Manila, NP  dicyclomine  (BENTYL ) 10 MG capsule 1 capsule Orally q6 hours for 30 days As needed for cramping 11/01/22   [provider]  fluticasone  (FLOVENT  HFA) 110 MCG/ACT inhaler Inhale 2 puffs into the lungs 2 (two) times daily as needed (for shortness of breath).     [provider]  levothyroxine  (SYNTHROID , LEVOTHROID) 50 MCG tablet Take 50 mcg by mouth daily before breakfast.  04/10/12   [provider]  LINZESS  290 MCG CAPS capsule TAKE 1 CAPSULE(290 MCG) BY MOUTH DAILY BEFORE BREAKFAST 11/12/22   Mahon, Martine Sleek, NP  LORazepam  (ATIVAN ) 1 MG tablet Take 1 mg by mouth 2 (two) times daily as needed for anxiety.     [provider]  montelukast (SINGULAIR) 10 MG tablet Take 10 mg by mouth daily. 09/01/21   [provider]  MOTEGRITY  2 MG TABS TAKE ONE TABLET BY MOUTH ONCE DAILY 01/14/23   Eustacio Highman, NP  potassium chloride  (KLOR-CON ) 20 MEQ packet Take 20 mEq by mouth daily.    [provider]  pravastatin (PRAVACHOL) 20 MG tablet Take 20 mg by mouth daily.    [provider]  predniSONE  (DELTASONE ) 50 MG tablet Take 1 tablet 13 hours prior to scan, 7 hours prior to scan and 1 hour prior to scan. 11/06/22   Julian Obey  L, NP  sucralfate  (CARAFATE ) 1 g tablet Take 1 tablet (1 g total) by mouth 4 (four) times daily -  with meals and at bedtime for 14 days. 11/06/22 11/20/22  Eustacio Highman, NP      Allergies    Ivp dye [iodinated contrast media], Nifedipine, and Sulfa antibiotics    Review of Systems   Review of Systems  Constitutional:  Negative for chills and fever.  HENT:  Negative for congestion and sore throat.   Eyes: Negative.   Respiratory:  Negative for chest tightness and shortness of breath.   Cardiovascular:  Negative for chest pain.  Gastrointestinal:  Positive for anal bleeding. Negative for abdominal pain, nausea and  vomiting.  Genitourinary: Negative.  Negative for dysuria.  Musculoskeletal:  Negative for arthralgias, joint swelling and neck pain.  Skin: Negative.  Negative for rash and wound.  Neurological:  Negative for dizziness, weakness, light-headedness, numbness and headaches.  Psychiatric/Behavioral: Negative.      Physical Exam Updated Vital Signs BP 135/75 (BP Location: Right Arm)   Pulse 72   Temp 98.6 F (37 C)   Resp 20   Ht 5\' 2"  (1.575 m)   Wt 63 kg   SpO2 98%   BMI 25.42 kg/m  Physical Exam Vitals and nursing note reviewed. Exam conducted with a chaperone present.  Constitutional:      Appearance: She is well-developed.  HENT:     Head: Normocephalic and atraumatic.  Eyes:     Conjunctiva/sclera: Conjunctivae normal.  Cardiovascular:     Rate and Rhythm: Normal rate and regular rhythm.     Heart sounds: Normal heart sounds.  Pulmonary:     Effort: Pulmonary effort is normal.     Breath sounds: Normal breath sounds. No wheezing.  Abdominal:     General: Bowel sounds are normal.     Palpations: Abdomen is soft.     Tenderness: There is no abdominal tenderness. There is no guarding or rebound.  Genitourinary:    Rectum: Guaiac result negative. Internal hemorrhoid present. No external hemorrhoid.     Comments: Internal fullness,  suspect hemorrhoid Musculoskeletal:        General: Normal range of motion.     Cervical back: Normal range of motion.  Skin:    General: Skin is warm and dry.  Neurological:     Mental Status: She is alert.     ED Results / Procedures / Treatments   Labs (all labs ordered are listed, but only abnormal results are displayed) Labs Reviewed  COMPREHENSIVE METABOLIC PANEL WITH GFR - Abnormal; Notable for the following components:      Result Value   Creatinine, Ser 1.09 (*)    GFR, Estimated 50 (*)    All other components within normal limits  CBC WITH DIFFERENTIAL/PLATELET  LIPASE, BLOOD  POC OCCULT BLOOD, ED  TYPE AND SCREEN     EKG None  Radiology No results found.  Procedures Procedures  {Document cardiac monitor, telemetry assessment procedure when appropriate:1}  Medications Ordered in ED Medications  ondansetron  (ZOFRAN -ODT) disintegrating tablet 4 mg (4 mg Oral Given 06/19/23 1234)    ED Course/ Medical Decision Making/ A&P   {   Click here for ABCD2, HEART and other calculatorsREFRESH Note before signing :1}                              Medical Decision Making Amount and/or Complexity of Data Reviewed Labs:  ordered.  Risk Prescription drug management.   ***  {Document critical care time when appropriate:1} {Document review of labs and clinical decision tools ie heart score, Chads2Vasc2 etc:1}  {Document your independent review of radiology images, and any outside records:1} {Document your discussion with family members, caretakers, and with consultants:1} {Document social determinants of health affecting pt's care:1} {Document your decision making why or why not admission, treatments were needed:1} Final Clinical Impression(s) / ED Diagnoses Final diagnoses:  None    Rx / DC Orders ED Discharge Orders     None

## 2023-06-19 NOTE — ED Triage Notes (Signed)
 Pt arrived via POV from home c/o rectal bleeding that she noticed this morning after having a BM. Pt reports is burns when she has a BM as well. Pt does endorse mild abdominal pain.

## 2023-07-14 NOTE — Progress Notes (Signed)
 GI Office Note    Referring Provider: Serpas Morna FALCON, NP Primary Care Physician:  Bluitt Morna FALCON, NP Primary Gastroenterologist: Carlin POUR. Cindie, DO  Date:  07/15/2023  ID:  Kelly Hebert, Kelly Hebert Mar 06, 1940, MRN [id number]  Chief Complaint   Chief Complaint  Patient presents with   Follow-up    Follow up on abd pain that comes and comes. Pt has sharp pains after eating (all foods). Stomach is no better. Pt couldn't get her Linzess  due to the cost   History of Present Illness  Kelly Hebert is a 83 y.o. female with a history of anxiety, depression, asthma, endometrial cancer, GERD, COPD, hypothyroidism, HLD, and vitamin D deficiency  presenting today with complaint of intermittent abdominal pain.   Office visit 09/21/2021 with Dr. Cindie.  Complaint of dysphagia, lower abdominal pain, and reflux.  No history of chronic GERD, maintained on Dexilant  60 mg daily.  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  Reported food and pills getting stuck in her substernal region.  Also with chronic constipation.  Was on Linzess  145 mcg daily and felt that it was helpful but was still having some issues with constipation.  Her main complaint was right lower quadrant abdominal pain that was constant but dull with intermittent moderate severity.  Denied any melena or hematochezia.  Patient was concerned about diverticulitis.  She requested further evaluation of dysphagia with EGD and possible dilation.  She was tender on exam to her right lower quadrant.  CT abdomen pelvis was ordered.  She was provided samples of Linzess  to 90 mcg daily.   CT abdomen pelvis performed 09/30/2021 with no acute abnormality in the abdomen or pelvis.  Did have evidence of right renal cyst and recommended follow-up imaging in 6 to 12 months (MRI preferred over CT).    Per telephone note on 10/19/2021 it was noted that patient's bowel movements were improved on  Linzess  290 mcg daily but she continued to have lower abdominal discomfort that was not improving and Dr. Tonie recommendations were to perform colonoscopy for further evaluation at time of EGD.   Last office visit 10/25/22.  Patient having left lower quadrant abdominal pain, previously some right lower quadrant pain.  Pain happens sporadically sometimes when trying to clean to bend over.  Pain does not last for long time and described as sharp/pinching.  Taking Linzess  290 mcg about every other day but having discomfort in between as well as lots of gas.  Reported a 7 pound weight loss in the last 4 months.  Continue to have some intermittent dysphagia but improved with pills and solids.  Patient opted for endoscopic evaluation as previously recommended by Dr. Cindie.  She was scheduled for EGD and colonoscopy.  Advised MiraLAX 17 g every other day as needed along with Linzess  290.  Advised to continue Dexilant  60 mg daily and consider imaging of renal cyst in 6/12 months.   Colonoscopy 01/12/22: -Sigmoid diverticulosis -4 mm polyp in the transverse colon -Exam otherwise normal -Path: Tubular adenoma -No repeat colonoscopy due to age   EGD 01/12/2022: -Benign-appearing esophageal stenosis s/p dilation by scope -Mildly severe esophagitis with no bleeding -Many gastric polyps -Normal duodenum -Advise repeat EGD in 4 to 6 weeks for dilation -Continue Dexilant  60 mg daily -Mechanical soft diet and avoid NSAIDs  Last office visit 11/06/22. LUQ cramping that extends into the LLQ and down her leg. Pain is worse when she  feels full. Taking dicyclomine  every 6 hours and this had been helping with her pain but states still keeps her in the bed. Reports occasional breakthrough GERD. Larger pills hard to swallow. Taking Linzess  for constipation and going once daily. Occasional stool softener. Advised CTA to assess for mesenteric ischemia given pain after meals. Advised motegrity  in addition to linzess .  Advised to consider EGD for dilation. Advised high protein diet and smaller meals throughout the day.   CTA A/P 12/07/22 IMPRESSION: - Pattern atherosclerotic plaque without significant visceral artery stenosis or occlusion. - At least mild stenosis of the origin of the left renal artery due to heavily calcified atherosclerotic plaque. - Stable appearance of mildly complex but almost certainly benign cyst in the interpolar right kidney.  CT A/P without contrast 01/07/23 IMPRESSION: - No acute intra-abdominal pathology identified. No definite radiographic explanation for the patient's reported symptoms. - Moderate colonic stool burden. 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  Today:  Unable to get Linzess  due to cost.  Continues to have intermittent abdominal pain after eating.  She states she would take lorazepam  at 4pm and then amitriptyline at night before bed (about 10 pm) woke up not feeling well (drowsy). Since stopping it she feels better.   Has stopped with dexilant  as well - insurance would not cover it. Every now and then she has some nausea but its more so pain. After eating 1-2 hours pinching in the upper mid belly. Does have burning in her throat. Belch es a lot because  of the ginger ale. She is unsure about anything else she has taken for her reflux.  Has stopped the dicyclomine  - she states she felt like she was drunk with taking it. \  Unsure if the sucralfate  was helpful.   She states potassium was irritating her stomach even with taking with food.   Insurance would not cover the Linzess . Her allergies have been fine so she also stopped Singulair.  Never received motegrity . Has been one week since she had a BM. She drank prune juice and that  made her stomach.   Wt Readings from Last 6 Encounters:  07/15/23 127 lb 9.6 oz (57.9 kg)  06/19/23 139 lb (63 kg)  01/07/23 136 lb (61.7 kg)  11/06/22 128 lb 12.8 oz (58.4 kg)  05/30/22 128 lb (58.1 kg)  01/12/22 139 lb (63 kg)    Current Outpatient Medications  Medication Sig Dispense Refill   albuterol  (PROVENTIL  HFA;VENTOLIN  HFA) 108 (90 BASE) MCG/ACT inhaler Inhale 2 puffs into the lungs every 6 (six) hours as needed for wheezing or shortness of breath.      amLODipine  (NORVASC ) 10 MG tablet Take 10 mg by mouth daily.     aspirin EC 81 MG tablet Take 81 mg by mouth daily.     benazepril -hydrochlorthiazide (LOTENSIN  HCT) 20-25 MG tablet Take 1 tablet by mouth daily.      buPROPion (WELLBUTRIN XL) 150 MG 24 hr tablet      cetirizine (ZYRTEC) 10 MG tablet Take 10 mg by mouth daily.     citalopram  (CELEXA ) 40 MG tablet Take 40 mg by mouth daily.       fluticasone  (FLOVENT  HFA) 110 MCG/ACT inhaler Inhale 2 puffs into the lungs 2 (two) times daily as needed (for shortness of breath).  hydrocortisone  (ANUSOL -HC) 25 MG suppository Place 1 suppository (25 mg total) rectally 2 (two) times daily. 12 suppository 0   levothyroxine  (SYNTHROID , LEVOTHROID) 50 MCG tablet Take 50 mcg by mouth daily before breakfast.      LORazepam  (ATIVAN ) 1 MG tablet Take 1 mg by mouth 2 (two) times daily as needed for anxiety.      pravastatin (PRAVACHOL) 20 MG tablet Take 20 mg by mouth daily.     amitriptyline (ELAVIL) 10 MG tablet Take 10 mg by mouth at bedtime.  (Patient not taking: Reported on 07/15/2023)     DEXILANT  60 MG capsule TAKE 1 CAPSULE BY MOUTH EVERY DAY (Patient not taking: Reported on 07/15/2023) 30 capsule 5   dicyclomine  (BENTYL ) 10 MG capsule 1 capsule Orally q6 hours for 30 days As needed for cramping (Patient not taking: Reported on 07/15/2023)     LINZESS  290 MCG CAPS capsule TAKE 1 CAPSULE(290 MCG) BY MOUTH DAILY BEFORE BREAKFAST (Patient not taking: Reported on 07/15/2023) 30 capsule 3    montelukast (SINGULAIR) 10 MG tablet Take 10 mg by mouth daily. (Patient not taking: Reported on 07/15/2023)     MOTEGRITY  2 MG TABS TAKE ONE TABLET BY MOUTH ONCE DAILY 30 tablet 1   potassium chloride  (KLOR-CON ) 20 MEQ packet Take 20 mEq by mouth daily. (Patient not taking: Reported on 07/15/2023)     predniSONE  (DELTASONE ) 50 MG tablet Take 1 tablet 13 hours prior to scan, 7 hours prior to scan and 1 hour prior to scan. (Patient not taking: Reported on 07/15/2023) 3 tablet 0   sucralfate  (CARAFATE ) 1 g tablet Take 1 tablet (1 g total) by mouth 4 (four) times daily -  with meals and at bedtime for 14 days. (Patient not taking: Reported on 07/15/2023) 56 tablet 0   No current facility-administered medications for this visit.    Past Medical History:  Diagnosis Date   Acid reflux    Anxiety    Arthritis    Asthma    Constipation    COPD (chronic obstructive pulmonary disease) (HCC)    Depression    Gout    Heart murmur    Hx of pyloric stenosis 11/14/2011   Hypercholesteremia    Hyperglycemia    Hypertension    Hypothyroidism    Renal disorder    pelvic kidney   Retinitis pigmentosa    S/P colonoscopy 2003   Dr. Chipper: normal colon, normal path.    Stomach ulcer    Vitamin D deficiency disease     Past Surgical History:  Procedure Laterality Date   ABDOMINAL HYSTERECTOMY     complete   BREAST SURGERY     breast biopsy    COLONOSCOPY  03/09/2011   DOQ:dphfnpi diverticula, small internal hemorrhoids.    COLONOSCOPY WITH PROPOFOL  N/A 01/12/2022   Procedure: COLONOSCOPY WITH PROPOFOL ;  Surgeon: Cindie Carlin POUR, DO;  [facility]: [facility];  Service: Endoscopy;  Laterality: N/A;  12:30 pm   ESOPHAGOGASTRODUODENOSCOPY  03/09/2011   DOQ:Dumprulmz in the distal esophagus/ Stenosis at the pylorus/ Mild gastritis/ Hiatal hernia. gastric bx showed gastritis but no H.pylori   ESOPHAGOGASTRODUODENOSCOPY  03/20/2011   SLF: stricture distal esophgus and stenosis at pyloris likely from  NSAIDs. s/p dialtion.    ESOPHAGOGASTRODUODENOSCOPY N/A 01/24/2017   Procedure: ESOPHAGOGASTRODUODENOSCOPY (EGD);  Surgeon: Harvey Margo CROME, MD;  Location: [facility];  Service: Endoscopy;  Laterality: N/A;  10:45am   ESOPHAGOGASTRODUODENOSCOPY N/A 02/08/2017   Procedure: ESOPHAGOGASTRODUODENOSCOPY (EGD);  Surgeon: Harvey Margo CROME, MD;  [facility]: [facility];  Service: Endoscopy;  Laterality: N/A;  12:45pm   ESOPHAGOGASTRODUODENOSCOPY N/A 04/12/2017   Procedure: ESOPHAGOGASTRODUODENOSCOPY (EGD);  Surgeon: Harvey Margo CROME, MD;  [facility]: [facility];  Service: Endoscopy;  Laterality: N/A;  1:30pm   ESOPHAGOGASTRODUODENOSCOPY (EGD) WITH ESOPHAGEAL DILATION  11/23/2011   SLF: Stricture was found at the gastroesophageal junction/ Small hiatal hernia/  Sessile polyp was found in the gastric body and gastric fundus/ Stenosis was found at the pylorus/ The duodenal mucosa showed no abnormalities in the bulb and second portion of the duodenum   ESOPHAGOGASTRODUODENOSCOPY (EGD) WITH ESOPHAGEAL DILATION  12/03/2011   DOQ:Enobe was found in the entire examined stomach/Stricture was found at the gastroesophageal junction   ESOPHAGOGASTRODUODENOSCOPY (EGD) WITH PROPOFOL  N/A 01/12/2022   Procedure: ESOPHAGOGASTRODUODENOSCOPY (EGD) WITH PROPOFOL ;  Surgeon: Cindie Carlin POUR, DO;  [facility]: [facility];  Service: Endoscopy;  Laterality: N/A;   LAPAROSCOPY  01/22/1998   [place]-stomach pain-pt reports negative   POLYPECTOMY  01/12/2022   Procedure: POLYPECTOMY;  Surgeon: Cindie Carlin POUR, DO;  [facility]: [facility];  Service: Endoscopy;;   ROTATOR CUFF REPAIR Right    SAVORY DILATION N/A 01/24/2017   Procedure: SAVORY DILATION;  Surgeon: Harvey Margo CROME, MD;  [facility]: [facility];  Service: Endoscopy;  Laterality: N/A;   SAVORY DILATION N/A 02/08/2017   Procedure: SAVORY DILATION;  Surgeon: Harvey Margo CROME, MD;  [facility]: [facility];  Service: Endoscopy;  Laterality: N/A;   SAVORY DILATION  N/A 04/12/2017   Procedure: SAVORY DILATION;  Surgeon: Harvey Margo CROME, MD;  [facility]: [facility];  Service: Endoscopy;  Laterality: N/A;    Family History  Problem Relation Age of Onset   Kidney disease Sister    Cirrhosis Brother        ?etoh   Diabetes Brother    Heart attack Brother    Heart disease Mother    Heart attack Mother    Heart disease Father    Heart attack Father    Diabetes Brother    Cirrhosis Sister        Non-etoh   Colon cancer Neg Hx     Allergies as of 07/15/2023 - Review Complete 07/15/2023  Allergen Reaction Noted   Ivp dye [iodinated contrast media] Hives, Itching, and Other (See Comments) 01/06/2011   Nifedipine Nausea And Vomiting and Other (See Comments) 03/02/2014   Sulfa antibiotics Rash 02/15/2020    Social History   Socioeconomic History   Marital status: Married    Spouse name: Not on file   Number of children: 3   Years of education: Not on file   Highest education level: Not on file  Occupational History   Occupation: retired Designer, fashion/clothing  Tobacco Use   Smoking status: Never    Passive exposure: Never   Smokeless tobacco: Never  Vaping Use   Vaping status: Never Used  Substance and Sexual Activity   Alcohol use: No   Drug use: No   Sexual activity: Not Currently  Other Topics Concern   Not on file  Social History Narrative   Not on file   Social Drivers of Health   Financial Resource Strain: Not on file  Food Insecurity: Not on file  Transportation Needs: Not on file  Physical Activity: Not on file  Stress: Not on file  Social Connections: Not on file     Review of Systems   Gen: + weight loss. Denies fever, chills, anorexia. Denies fatigue, weakness. CV: Denies chest pain, palpitations,  syncope, peripheral edema, and claudication. Resp: Denies dyspnea at rest, cough, wheezing, coughing up blood, and pleurisy. GI: See HPI Derm: Denies rash, itching, dry skin Psych: Denies depression, anxiety, memory loss,  confusion. No homicidal or suicidal ideation.  Heme: Denies bruising, bleeding, and enlarged lymph nodes. + knee pain.   Physical Exam   BP 121/71   Pulse 74   Temp 98.5 F (36.9 C)   Ht 5' 2 (1.575 m)   Wt 127 lb 9.6 oz (57.9 kg)   BMI 23.34 kg/m   General:   Alert and oriented. No distress noted. Pleasant and cooperative.  Head:  Normocephalic and atraumatic. Eyes:  Conjuctiva clear without scleral icterus. Abdomen:  +BS, soft, non-tender and non-distended. No rebound or guarding. No HSM or masses noted. Rectal: deferred Msk:  Symmetrical without gross deformities. Normal posture. Extremities:  Without edema. Neurologic:  Alert and  oriented x4 Psych:  Alert and cooperative. Normal mood and affect.  Assessment  Kelly Hebert is a 83 y.o. female presenting today with epigastric/left upper quadrant pain, GERD, and constipation.  Epigastric/LUQ abdominal pain, GERD, dysphagia, weight loss:  - Pain primarily in the epigastric region -Has been without Dexilant  given insurance would not cover it -Having intermittent nausea -Pain usually occurs 1-2 hours after meals and is more of a pinching in her upper belly and also occasionally having a burning in her throat. -Admits to lots of ginger ale consumption, does belch a lot because of this. -Not taking anything over-the-counter for reflux, previously given Carafate  but she states this was a while ago but unsure if it was helpful. -Potassium pills tend to irritate her stomach more as well. -She states she was on Dexilant  for a long time previously, unsure of any other PPIs she has tried.  Will send in pantoprazole  for her to take 40 mg once daily, can increase to twice daily if needed. -Discussed Tums as needed for breakthrough -GERD diet reinforced - Given concerns about her weight, I encouraged high-protein diet and Lactaid tablets to help with dairy intake.  - Discussed only using dicyclomine  for severe refractory abdominal  pain given it, it constipation worse, she is in agreement with this.  Chronic constipation:  -Unable to afford Linzess  due to cost. - With previously taking dicyclomine  frequently although feeling dizzy/drunk while taking. - Suspect that some of her symptoms/fullness could be secondary to constipation therefore discussed need for bowel regimen.  She does not like MiraLAX - Trial Amitiza  8 mcg twice daily, counseled on the washout period.  - Likely inadequate hydration, discussed need for increased water  intake. - Discussed high-fiber diet.  PLAN   Pantoprazole  40 mg daily GERD diet Tums as needed for breakthrough Adequate hydration High-fiber diet Amitiza  8 mcg BID.  Counseled on side effects and washout period.  Dicyclomine  10 mg once daily as needed.  Encouraged high-protein diet to prevent any weight loss. Lactaid tablets with dairy intake Follow-up in 6 weeks  Charmaine Melia, MSN, FNP-BC, AGACNP-BC [facility]

## 2023-07-15 ENCOUNTER — Ambulatory Visit (INDEPENDENT_AMBULATORY_CARE_PROVIDER_SITE_OTHER): Admitting: Gastroenterology

## 2023-07-15 ENCOUNTER — Encounter: Payer: Self-pay | Admitting: Gastroenterology

## 2023-07-15 VITALS — BP 121/71 | HR 74 | Temp 98.5°F | Ht 62.0 in | Wt 127.6 lb

## 2023-07-15 DIAGNOSIS — R1012 Left upper quadrant pain: Secondary | ICD-10-CM

## 2023-07-15 DIAGNOSIS — K5904 Chronic idiopathic constipation: Secondary | ICD-10-CM

## 2023-07-15 DIAGNOSIS — R634 Abnormal weight loss: Secondary | ICD-10-CM

## 2023-07-15 DIAGNOSIS — K219 Gastro-esophageal reflux disease without esophagitis: Secondary | ICD-10-CM

## 2023-07-15 DIAGNOSIS — R1013 Epigastric pain: Secondary | ICD-10-CM | POA: Diagnosis not present

## 2023-07-15 DIAGNOSIS — K5909 Other constipation: Secondary | ICD-10-CM

## 2023-07-15 DIAGNOSIS — R1310 Dysphagia, unspecified: Secondary | ICD-10-CM

## 2023-07-15 DIAGNOSIS — R109 Unspecified abdominal pain: Secondary | ICD-10-CM

## 2023-07-15 MED ORDER — PANTOPRAZOLE SODIUM 40 MG PO TBEC: 40.0000 mg | DELAYED_RELEASE_TABLET | Freq: Every day | ORAL | 3 refills | Status: AC

## 2023-07-15 MED ORDER — LUBIPROSTONE 24 MCG PO CAPS: 24.0000 ug | ORAL_CAPSULE | Freq: Two times a day (BID) | ORAL | 3 refills | Status: DC

## 2023-07-15 MED ORDER — DICYCLOMINE HCL 10 MG PO CAPS: 10.0000 mg | ORAL_CAPSULE | Freq: Every day | ORAL | 1 refills | Status: DC | PRN

## 2023-07-15 NOTE — Patient Instructions (Addendum)
 For upper abdominal pain/reflux: Start pantoprazole  40 mg once daily, at least 30 minutes prior to breakfast.  Is best taken on empty stomach. Follow a GERD diet:  Avoid fried, fatty, greasy, spicy, citrus foods. Avoid/limit caffeine and carbonated beverages. Avoid chocolate. Try eating 4-6 small meals a day rather than 3 large meals. Do not eat within 3 hours of laying down. Prop head of bed up on wood or bricks to create a 6 inch incline. If you continue to have some upper abdominal pain/throat burning he may take a dose of Tums if needed. I refilled your dicyclomine .  You may take 1 dose a day AS NEEDED.  Be mindful of any drowsiness/dizziness.  For constipation; Ensure you are staying hydrated, at least 4 to 6 glasses of water  daily. You may continue warm apple juice and/or prune juice. Higher fiber diet is usually also helpful for constipation. Trial lubiprostone/Amitiza 8 mcg (1 tablet) in the morning and evening with food.  If you have any significant diarrhea you may begin taking once daily.  It is not uncommon for the first week while taking the medication to have diarrhea so please give it at least a 2-week trial.  For your weight I want you to continue to monitor this at least weekly and notify me if you have any drops more than 5 pounds in a week.  I want you to follow a more high-protein diet, supplementing with Ensure, fair life core power protein shakes and/or increasing any extra fat.  Milkshakes are okay if this helps.  You can use Lactaid tablets before consuming dairy products.  Follow-up 6 weeks.  It was a pleasure to see you again today. I want to create trusting relationships with patients. If you receive a survey regarding your visit,  I greatly appreciate you taking time to fill this out on paper or through your MyChart. I value your feedback.  Charmaine Melia, MSN, FNP-BC, AGACNP-BC [facility]

## 2023-08-05 NOTE — Progress Notes (Unsigned)
 Cardiology Office Note    Date:  08/07/2023  ID:  Hara, [place] 01/20/1941, MRN [id number] Cardiologist: Alvan Carrier, MD    History of Present Illness:    Kelly Hebert is a 83 y.o. female with past medical history of carotid artery stenosis, HTN, HLD and hypothyroidism who presents to the office today for annual follow-up.   She was last examined by Dr. Alvan in 05/2022 and denied any recent anginal symptoms at that time. Previously had GI side effects on Atorvastatin and was taking Pravastatin at that time. Was recommended to obtain follow-up carotid dopplers for reassessment. These were obtained later that month and showed 50-69% stenoses bilaterally.   In talking with the patient and her daughter, she reports things have overall been stable from a cardiac perspective since her last office visit. Unfortunately, her husband does have dementia and recently moved into a long-term care facility. She uses a cane for ambulation. Denies any progressive dyspnea on exertion or recent chest pain. No specific palpitations, orthopnea, PND or pitting edema. She does check her blood pressure on occasion at home and it is overall been well-controlled. At 122/66 during today's visit.  Studies Reviewed:   EKG: EKG is not ordered today.   Echocardiogram: 06/2016 Study Conclusions   - Left ventricle: The cavity size was normal. Systolic function was    normal. The estimated ejection fraction was in the range of 60%    to 65%. Wall motion was normal; there were no regional wall    motion abnormalities. Left ventricular diastolic function    parameters were normal.  - Aortic valve: Valve area (VTI): 1.78 cm^2. Valve area (Vmax):    1.61 cm^2. Valve area (Vmean): 1.71 cm^2.  - Left atrium: Some shadowing artifact in LA on subcostal images.  - Atrial septum: No defect or patent foramen ovale was identified.   Carotid Dopplers: 05/2022 IMPRESSION: 1. Interval progression of bilateral  carotid artery disease compared to prior imaging from 2018. 2. Moderate (50-69%) stenosis proximal right internal carotid artery secondary to heterogenous atherosclerotic plaque. 3. Moderate (50-69%) stenosis proximal left internal carotid artery secondary to heterogenous atherosclerotic plaque. Subjectively, the stenosis is likely at the higher end of the range. 4. Vertebral arteries are patent with antegrade flow.   Physical Exam:   VS:  BP 122/66 (BP Location: Right Arm, Cuff Size: Normal)   Pulse 75   Ht 5' 2 (1.575 m)   Wt 125 lb 12.8 oz (57.1 kg)   SpO2 99%   BMI 23.01 kg/m    Wt Readings from Last 3 Encounters:  08/07/23 125 lb 12.8 oz (57.1 kg)  07/15/23 127 lb 9.6 oz (57.9 kg)  06/19/23 139 lb (63 kg)     GEN: Well nourished, well developed female appearing in no acute distress NECK: No JVD; Bilateral carotid bruits.  CARDIAC: RRR, no murmurs, rubs, gallops RESPIRATORY:  Clear to auscultation without rales, wheezing or rhonchi  ABDOMEN: Appears non-distended. No obvious abdominal masses. EXTREMITIES: No clubbing or cyanosis. No pitting edema.  Distal pedal pulses are 2+ bilaterally.   Assessment and Plan:   1. Bilateral carotid artery stenosis - Most recent dopplers in 05/2022 showed 50 to 69% stenosis bilaterally. Will plan for follow-up imaging for reassessment. Continue ASA 81 mg daily and Pravastatin 20 mg daily.  2. Hyperlipidemia LDL goal <70 - Followed by her PCP and LDL was at 51 when checked last year. Will request a copy of most recent labs. Continue Pravastatin 20  mg daily.  3. Essential hypertension - BP is well-controlled at 122/66 during today's visit and has been well-controlled when checked in the ambulatory setting. Continue current medical therapy with Amlodipine  10 mg daily and Benazepril -HCTZ 20-25 mg daily.   Signed, Kelly CHRISTELLA Qua, PA-C

## 2023-08-07 ENCOUNTER — Encounter: Payer: Self-pay | Admitting: *Deleted

## 2023-08-07 ENCOUNTER — Encounter: Payer: Self-pay | Admitting: Student

## 2023-08-07 ENCOUNTER — Ambulatory Visit: Attending: Student | Admitting: Student

## 2023-08-07 VITALS — BP 122/66 | HR 75 | Ht 62.0 in | Wt 125.8 lb

## 2023-08-07 DIAGNOSIS — I10 Essential (primary) hypertension: Secondary | ICD-10-CM | POA: Diagnosis not present

## 2023-08-07 DIAGNOSIS — I6523 Occlusion and stenosis of bilateral carotid arteries: Secondary | ICD-10-CM

## 2023-08-07 DIAGNOSIS — E785 Hyperlipidemia, unspecified: Secondary | ICD-10-CM

## 2023-08-07 NOTE — Patient Instructions (Addendum)
 Medication Instructions:   Continue current medications.   *If you need a refill on your cardiac medications before your next appointment, please call your pharmacy*  Testing/Procedures:  Your physician has requested that you have a carotid duplex. This test is an ultrasound of the carotid arteries in your neck. It looks at blood flow through these arteries that supply the brain with blood. Allow one hour for this exam. There are no restrictions or special instructions.   Follow-Up: At [facility], you and your health needs are our priority.  As part of our continuing mission to provide you with exceptional heart care, our providers are all part of one team.  This team includes your primary Cardiologist (physician) and Advanced Practice Providers or APPs (Physician Assistants and Nurse Practitioners) who all work together to provide you with the care you need, when you need it.  Your next appointment:   1 year(s)  Provider:   You may see Alvan Carrier, MD or one of the following Advanced Practice Providers on your designated Care Team:   Laymon Qua, PA-C  Scotesia [place], [place] Olivia Pavy, [place]     We recommend signing up for the patient portal called MyChart.  Sign up information is provided on this After Visit Summary.  MyChart is used to connect with patients for Virtual Visits (Telemedicine).  Patients are able to view lab/test results, encounter notes, upcoming appointments, etc.  Non-urgent messages can be sent to your provider as well.   To learn more about what you can do with MyChart, go to [email or web address].

## 2023-08-16 ENCOUNTER — Encounter: Payer: Self-pay | Admitting: Gastroenterology

## 2023-08-16 ENCOUNTER — Ambulatory Visit (HOSPITAL_COMMUNITY)
Admission: RE | Admit: 2023-08-16 | Discharge: 2023-08-16 | Disposition: A | Discharge status: Home / Self Care | Source: Ambulatory Visit | Attending: Student | Admitting: Student

## 2023-08-16 DIAGNOSIS — I6523 Occlusion and stenosis of bilateral carotid arteries: Secondary | ICD-10-CM | POA: Diagnosis present

## 2023-08-19 ENCOUNTER — Ambulatory Visit: Payer: Self-pay | Admitting: Student

## 2023-10-03 ENCOUNTER — Other Ambulatory Visit: Payer: Self-pay | Admitting: Gastroenterology

## 2023-10-03 ENCOUNTER — Telehealth: Payer: Self-pay

## 2023-10-03 DIAGNOSIS — R109 Unspecified abdominal pain: Secondary | ICD-10-CM

## 2023-10-03 DIAGNOSIS — R1013 Epigastric pain: Secondary | ICD-10-CM

## 2023-10-03 MED ORDER — DICYCLOMINE HCL 10 MG PO CAPS: 10.0000 mg | ORAL_CAPSULE | Freq: Every day | ORAL | 1 refills | Status: DC | PRN

## 2023-10-03 NOTE — Telephone Encounter (Signed)
 Pt is requesting refills on dicyclomine .

## 2023-12-16 ENCOUNTER — Other Ambulatory Visit: Payer: Self-pay | Admitting: Gastroenterology

## 2023-12-16 DIAGNOSIS — R1013 Epigastric pain: Secondary | ICD-10-CM

## 2023-12-16 DIAGNOSIS — R109 Unspecified abdominal pain: Secondary | ICD-10-CM

## 2024-01-02 ENCOUNTER — Telehealth: Payer: Self-pay | Admitting: *Deleted

## 2024-01-02 ENCOUNTER — Other Ambulatory Visit: Payer: Self-pay | Admitting: *Deleted

## 2024-01-02 ENCOUNTER — Encounter: Payer: Self-pay | Admitting: Gastroenterology

## 2024-01-02 ENCOUNTER — Ambulatory Visit: Admitting: Gastroenterology

## 2024-01-02 VITALS — BP 136/80 | HR 72 | Temp 97.8°F | Ht 62.0 in | Wt 124.0 lb

## 2024-01-02 DIAGNOSIS — K5904 Chronic idiopathic constipation: Secondary | ICD-10-CM

## 2024-01-02 DIAGNOSIS — R1013 Epigastric pain: Secondary | ICD-10-CM | POA: Diagnosis not present

## 2024-01-02 DIAGNOSIS — K219 Gastro-esophageal reflux disease without esophagitis: Secondary | ICD-10-CM | POA: Diagnosis not present

## 2024-01-02 DIAGNOSIS — R1012 Left upper quadrant pain: Secondary | ICD-10-CM

## 2024-01-02 DIAGNOSIS — K581 Irritable bowel syndrome with constipation: Secondary | ICD-10-CM | POA: Diagnosis not present

## 2024-01-02 MED ORDER — VOQUEZNA 10 MG PO TABS: 10.0000 mg | ORAL_TABLET | Freq: Every day | ORAL | Status: AC

## 2024-01-02 MED ORDER — LUBIPROSTONE 24 MCG PO CAPS: 24.0000 ug | ORAL_CAPSULE | Freq: Two times a day (BID) | ORAL | 3 refills | Status: AC

## 2024-01-02 NOTE — Telephone Encounter (Signed)
 Medication Samples have been provided to the patient.  Drug name: Voquezna       Strength: 10mg         Qty: 6 boxes   LOT: [id number]  Exp.Date: 03/2024  Dosing instructions: Take one before break\fast. Daily.   The patient has been instructed regarding the correct time, dose, and frequency of taking this medication, including desired effects and most common side effects.   Adrien Pfeiffer [place] 4:11 PM 01/02/2024

## 2024-01-02 NOTE — Patient Instructions (Addendum)
 I am getting give you some Voquezna 10 mg to take once daily and see if this helps more with the burning that you are experiencing in your upper belly and chest area.  This will be in place of your pantoprazole .  Please call us  and let us  know how you are doing with the trial and if you find it helpful I will send in prescription.  If you are not noticing much of a difference at all with the Voquezna then we can go back to your pantoprazole  but I would like for you to increase it to twice a day instead of once a day.  I am going to attach the same recommendations I gave you previously about diet below.  Follow a GERD diet:  Avoid fried, fatty, greasy, spicy, citrus foods. Avoid caffeine and carbonated beverages. Avoid chocolate. Try eating 4-6 small meals a day rather than 3 large meals. Do not eat within 3 hours of laying down. Prop head of bed up on wood or bricks to create a 6 inch incline.  I think it would be best for you to keep a food diary along with a symptom log to try to  correlate what you are taking in with the symptoms that you are having.  Please try to write down how long the symptoms last as well.  It would be okay if you are having any extra burning or pain that if you would like to take sometimes and see if this helps calm it down then let me know.  For constipation I want you to continue taking your Amitiza  twice a day to help keep regular bowel movements.  I am going to go ahead and set up a 52-month follow-up for you and we can change this if needed.  It was a pleasure to see you today. I want to create trusting relationships with patients. If you receive a survey regarding your visit,  I greatly appreciate you taking time to fill this out on paper or through your MyChart. I value your feedback.  Charmaine Melia, MSN, FNP-BC, AGACNP-BC [facility]

## 2024-01-02 NOTE — Progress Notes (Signed)
 GI Office Note    Referring Provider: Bangerter Morna FALCON, NP Primary Care Physician:  Deming Morna FALCON, NP Primary Gastroenterologist: Carlin POUR. Cindie, DO  Date:  01/02/2024  ID:  Kelly Hebert, DOB 07/21/1940, MRN [id number]   Chief Complaint   Chief Complaint  Patient presents with   Follow-up    Follow up. Having mid abdominal pains that goes up her left side under her arm pit    History of Present Illness  Kelly Hebert is a 83 y.o. female with a history of anxiety, depression, asthma, endometrial cancer, GERD, COPD, hypothyroidism, HLD, and vitamin D deficiency presenting today with complaint of epigastric abdominal pain that extends to her left upper side and under her armpit.  OV 09/21/2021 with Dr. Cindie.  Complaint of dysphagia, lower abdominal pain, and reflux.  No history of chronic GERD, maintained on Dexilant  60 mg daily.  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  Reported food and pills getting stuck in her substernal region.  Also with chronic constipation.  Was on Linzess  145 mcg daily and felt that it was helpful but was still having some issues with constipation.  Her main complaint was right lower quadrant abdominal pain that was constant but dull with intermittent moderate severity.  Denied any melena or hematochezia.  Patient was concerned about diverticulitis.  She requested further evaluation of dysphagia with EGD and possible dilation.  She was tender on exam to her right lower quadrant.  CT abdomen pelvis was ordered.  She was provided samples of Linzess  to 90 mcg daily.   CT abdomen pelvis performed 09/30/2021 with no acute abnormality in the abdomen or pelvis.  Did have evidence of right renal cyst and recommended follow-up imaging in 6 to 12 months (MRI preferred over CT).    Per telephone note on 10/19/2021 it was noted that patient's bowel movements were improved on Linzess  290 mcg daily but  she continued to have lower abdominal discomfort that was not improving and Dr. Tonie recommendations were to perform colonoscopy for further evaluation at time of EGD.   OV 10/25/22.  Patient having left lower quadrant abdominal pain, previously some right lower quadrant pain.  Pain happens sporadically sometimes when trying to clean to bend over.  Pain does not last for long time and described as sharp/pinching.  Taking Linzess  290 mcg about every other day but having discomfort in between as well as lots of gas.  Reported a 7 pound weight loss in the last 4 months.  Continue to have some intermittent dysphagia but improved with pills and solids.  Patient opted for endoscopic evaluation as previously recommended by Dr. Cindie.  She was scheduled for EGD and colonoscopy.  Advised MiraLAX 17 g every other day as needed along with Linzess  290.  Advised to continue Dexilant  60 mg daily and consider imaging of renal cyst in 6/12 months.   Colonoscopy 01/12/22: -Sigmoid diverticulosis -4 mm polyp in the transverse colon -Exam otherwise normal -Path: Tubular adenoma -No repeat colonoscopy due to age   EGD 01/12/2022: -Benign-appearing esophageal stenosis s/p dilation by scope -Mildly severe esophagitis with no bleeding -Many gastric polyps -Normal duodenum -Advise repeat EGD in 4 to 6 weeks for dilation -Continue Dexilant  60 mg daily -Mechanical soft diet and avoid NSAIDs   OV 11/06/22. LUQ cramping that extends into the LLQ and down her leg. Pain is worse when she feels full. Taking dicyclomine  every 6 hours  and this had been helping with her pain but states still keeps her in the bed. Reports occasional breakthrough GERD. Larger pills hard to swallow. Taking Linzess  for constipation and going once daily. Occasional stool softener. Advised CTA to assess for mesenteric ischemia given pain after meals. Advised motegrity  in addition to linzess . Advised to consider EGD for dilation. Advised high  protein diet and smaller meals throughout the day.    CTA A/P 12/07/22 IMPRESSION: - Pattern atherosclerotic plaque without significant visceral artery stenosis or occlusion. - At least mild stenosis of the origin of the left renal artery due to heavily calcified atherosclerotic plaque. - Stable appearance of mildly complex but almost certainly benign cyst in the interpolar right kidney.   CT A/P without contrast 01/07/23 IMPRESSION: - No acute intra-abdominal pathology identified. No definite radiographic explanation for the patient's reported symptoms. - Moderate colonic stool burden. 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  Last office visit June 2025.  Unable to get Linzess  due to cost.  Has stopped dicyclomine  and states she feels like she was drunk with taking it.  She was unsure if Carafate  was helpful.  Reported that potassium supplementation was holding her stomach.  I never received Motegrity  and had tried prune juice and did not find this helpful and had not had a bowel movement for a week.  Reported some occasional nausea but had stopped excellent given insurance will not cover it and is unsure of anything else she has taken for her reflux.  Continues to have intermittent abdominal pain after eating.  Advised pantoprazole  40 mg daily for reflux Tums as needed for breakthrough.  Encouraged increased hydration and high-fiber diet as well as Amitiza  8 mcg twice daily.  Advised only use dicyclomine  once daily as needed for pain and try Lactaid tablets with dairy intake.  Also encouraged a high-protein diet.  Today:  Discussed the use of AI scribe software for clinical note transcription with the patient, who gave verbal consent to proceed.  Daughter present with her  for office visit.   She experiences intermittent sharp pain in the upper abdomen, originating in the middle of the stomach and radiating to the left side under her left axilla, which becomes tender. The pain worsens after eating at times and is aggravated by lying on her left side, leading her to avoid this position.  Certain foods and drinks, particularly coffee, exacerbate her symptoms, even when consumed with lactate milk and organic sugar. She has attempted dietary modifications, such as eating earlier in the day, to prevent nighttime symptoms.  She has a history of acid reflux, experiencing a burning sensation in her chest and upper abdomen, which she associates with heartburn. She has tried multiple medications, including pantoprazole  and Dexilant , but insurance coverage issues have limited her options. She is currently on pantoprazole  and has previously been on Nexium  she believes. Had done fairly well on dexilant  in the past but insurance will not cover. Carafate  has been used in the past with some success.  She takes lubiprostone  (Amitiza ) for bowel movements, which are regular with the help of apple juice and strawberry applesauce. She experiences lower abdominal pain and has a history of scar tissue and back issues which is suspected culprit of the pain. Dicyclomine  (Bentyl ) is used as needed for abdominal  pain, but she is cautious due to potential side effects like dizziness and constipation.  She is on Cymbalta, prescribed for chronic pain and possibly mood swings. After two to three weeks of use, she reports a slight difference in her body jitteriness, though mood swings persist.      Previously tried medications: - pantoprazole  40 mg daily - Dexilant  60 mg daily.  - believes there is at least one more but unable to recall - possibly Nexium .   Wt Readings from Last 6 Encounters:  01/02/24 124 lb (56.2 kg)  08/07/23 125 lb 12.8 oz (57.1 kg)  07/15/23 127 lb 9.6 oz (57.9 kg)   06/19/23 139 lb (63 kg)  01/07/23 136 lb (61.7 kg)  11/06/22 128 lb 12.8 oz (58.4 kg)    Body mass index is 22.68 kg/m.   Current Outpatient Medications  Medication Sig Dispense Refill   albuterol  (PROVENTIL  HFA;VENTOLIN  HFA) 108 (90 BASE) MCG/ACT inhaler Inhale 2 puffs into the lungs every 6 (six) hours as needed for wheezing or shortness of breath.      amLODipine  (NORVASC ) 10 MG tablet Take 10 mg by mouth daily.     aspirin EC 81 MG tablet Take 81 mg by mouth daily.     benazepril -hydrochlorthiazide (LOTENSIN  HCT) 20-25 MG tablet Take 1 tablet by mouth daily.      buPROPion (WELLBUTRIN XL) 150 MG 24 hr tablet      cetirizine (ZYRTEC) 10 MG tablet Take 10 mg by mouth daily.     citalopram  (CELEXA ) 40 MG tablet Take 40 mg by mouth daily.       dicyclomine  (BENTYL ) 10 MG capsule TAKE ONE CAPSULE BY MOUTH DAILY AS NEEDED FOR SPASMS 30 capsule 0   DULoxetine (CYMBALTA) 30 MG capsule Take 30 mg by mouth daily.     fluticasone  (FLOVENT  HFA) 110 MCG/ACT inhaler Inhale 2 puffs into the lungs 2 (two) times daily as needed (for shortness of breath).      levothyroxine  (SYNTHROID , LEVOTHROID) 50 MCG tablet Take 50 mcg by mouth daily before breakfast.      LORazepam  (ATIVAN ) 1 MG tablet Take 1 mg by mouth 2 (two) times daily as needed for anxiety.      lubiprostone  (AMITIZA ) 24 MCG capsule Take 1 capsule (24 mcg total) by mouth 2 (two) times daily with a meal. 60 capsule 3   pantoprazole  (PROTONIX ) 40 MG tablet Take 1 tablet (40 mg total) by mouth daily. 90 tablet 3   pravastatin (PRAVACHOL) 20 MG tablet Take 20 mg by mouth daily.     No current facility-administered medications for this visit.    Past Medical History:  Diagnosis Date   Acid reflux    Anxiety    Arthritis    Asthma    Constipation    COPD (chronic obstructive pulmonary disease) (HCC)    Depression    Gout    Heart murmur    Hx of pyloric stenosis 11/14/2011   Hypercholesteremia    Hyperglycemia    Hypertension     Hypothyroidism    Renal disorder    pelvic kidney   Retinitis pigmentosa    S/P colonoscopy 2003   Dr. Chipper: normal colon, normal path.    Stomach ulcer    Vitamin D deficiency disease     Past Surgical History:  Procedure Laterality Date   ABDOMINAL HYSTERECTOMY     complete   BREAST SURGERY     breast biopsy    COLONOSCOPY  03/09/2011  DOQ:dphfnpi diverticula, small internal hemorrhoids.    COLONOSCOPY WITH PROPOFOL  N/A 01/12/2022   Procedure: COLONOSCOPY WITH PROPOFOL ;  Surgeon: Cindie Carlin POUR, DO;  Location: [facility];  Service: Endoscopy;  Laterality: N/A;  12:30 pm   ESOPHAGOGASTRODUODENOSCOPY  03/09/2011   DOQ:Dumprulmz in the distal esophagus/ Stenosis at the pylorus/ Mild gastritis/ Hiatal hernia. gastric bx showed gastritis but no H.pylori   ESOPHAGOGASTRODUODENOSCOPY  03/20/2011   SLF: stricture distal esophgus and stenosis at pyloris likely from NSAIDs. s/p dialtion.    ESOPHAGOGASTRODUODENOSCOPY N/A 01/24/2017   Procedure: ESOPHAGOGASTRODUODENOSCOPY (EGD);  Surgeon: Harvey Margo CROME, MD;  Location: [facility];  Service: Endoscopy;  Laterality: N/A;  10:45am   ESOPHAGOGASTRODUODENOSCOPY N/A 02/08/2017   Procedure: ESOPHAGOGASTRODUODENOSCOPY (EGD);  Surgeon: Harvey Margo CROME, MD;  Location: [facility];  Service: Endoscopy;  Laterality: N/A;  12:45pm   ESOPHAGOGASTRODUODENOSCOPY N/A 04/12/2017   Procedure: ESOPHAGOGASTRODUODENOSCOPY (EGD);  Surgeon: Harvey Margo CROME, MD;  Location: [facility];  Service: Endoscopy;  Laterality: N/A;  1:30pm   ESOPHAGOGASTRODUODENOSCOPY (EGD) WITH ESOPHAGEAL DILATION  11/23/2011   SLF: Stricture was found at the gastroesophageal junction/ Small hiatal hernia/  Sessile polyp was found in the gastric body and gastric fundus/ Stenosis was found at the pylorus/ The duodenal mucosa showed no abnormalities in the bulb and second portion of the duodenum   ESOPHAGOGASTRODUODENOSCOPY (EGD) WITH ESOPHAGEAL DILATION  12/03/2011    DOQ:Enobe was found in the entire examined stomach/Stricture was found at the gastroesophageal junction   ESOPHAGOGASTRODUODENOSCOPY (EGD) WITH PROPOFOL  N/A 01/12/2022   Procedure: ESOPHAGOGASTRODUODENOSCOPY (EGD) WITH PROPOFOL ;  Surgeon: Cindie Carlin POUR, DO;  Location: [facility];  Service: Endoscopy;  Laterality: N/A;   LAPAROSCOPY  01/22/1998   [place]-stomach pain-pt reports negative   POLYPECTOMY  01/12/2022   Procedure: POLYPECTOMY;  Surgeon: Cindie Carlin POUR, DO;  Location: [facility];  Service: Endoscopy;;   ROTATOR CUFF REPAIR Right    SAVORY DILATION N/A 01/24/2017   Procedure: SAVORY DILATION;  Surgeon: Harvey Margo CROME, MD;  [facility]: [facility];  Service: Endoscopy;  Laterality: N/A;   SAVORY DILATION N/A 02/08/2017   Procedure: SAVORY DILATION;  Surgeon: Harvey Margo CROME, MD;  [facility]: [facility];  Service: Endoscopy;  Laterality: N/A;   SAVORY DILATION N/A 04/12/2017   Procedure: SAVORY DILATION;  Surgeon: Harvey Margo CROME, MD;  Location: [facility];  Service: Endoscopy;  Laterality: N/A;    Family History  Problem Relation Age of Onset   Kidney disease Sister    Cirrhosis Brother        ?etoh   Diabetes Brother    Heart attack Brother    Heart disease Mother    Heart attack Mother    Heart disease Father    Heart attack Father    Diabetes Brother    Cirrhosis Sister        Non-etoh   Colon cancer Neg Hx     Allergies as of 01/02/2024 - Review Complete 01/02/2024  Allergen Reaction Noted   Ivp dye [iodinated contrast media] Hives, Itching, and Other (See Comments) 01/06/2011   Nifedipine Nausea And Vomiting and Other (See Comments) 03/02/2014   Sulfa antibiotics Rash 02/15/2020    Social History   Socioeconomic History   Marital status: Married    Spouse name: Not on file   Number of children: 3   Years of education: Not on file   Highest education level: Not on file  Occupational History   Occupation: retired Designer, Fashion/clothing  Tobacco  Use    Smoking status: Never    Passive exposure: Never   Smokeless tobacco: Never  Vaping Use   Vaping status: Never Used  Substance and Sexual Activity   Alcohol use: No   Drug use: No   Sexual activity: Not Currently  Other Topics Concern   Not on file  Social History Narrative   Not on file   Social Drivers of Health   Tobacco Use: Low Risk (01/02/2024)   Patient History    Smoking Tobacco Use: Never    Smokeless Tobacco Use: Never    Passive Exposure: Never  Financial Resource Strain: Not on file  Food Insecurity: Not on file  Transportation Needs: Not on file  Physical Activity: Not on file  Stress: Not on file  Social Connections: Not on file  Depression (EYV7-0): Not on file  Alcohol Screen: Not on file  Housing: Not on file  Utilities: Not on file  Health Literacy: Not on file    Review of Systems   Gen: Denies fever, chills, anorexia. Denies fatigue, weakness, weight loss.  CV: Denies chest pain, palpitations, syncope, peripheral edema, and claudication. Resp: Denies dyspnea at rest, cough, wheezing, coughing up blood, and pleurisy. GI: See HPI Derm: Denies rash, itching, dry skin Psych: + Mood swings.  Denies anxiety, memory loss, confusion. No homicidal or suicidal ideation.  Heme: Denies bruising, bleeding, and enlarged lymph nodes. + Knee pain  Physical Exam   BP 136/80 (BP Location: Right Arm, Patient Position: Sitting, Cuff Size: Normal)   Pulse 72   Temp 97.8 F (36.6 C) (Temporal)   Ht 5' 2 (1.575 m)   Wt 124 lb (56.2 kg)   BMI 22.68 kg/m   General:   Alert and oriented. No distress noted. Pleasant and cooperative.  Head:  Normocephalic and atraumatic. Eyes:  Conjuctiva clear without scleral icterus. Mouth:  Oral mucosa pink and moist. No lesions. Abdomen:  +BS, soft,  non-distended.  No significant tenderness to epigastric region or left upper quadrant but with mild TTP across lower abdomen. No rebound or guarding. No HSM or masses  noted. Rectal: Deferred. Msk:  Symmetrical without gross deformities. Normal posture. Extremities:  Without edema. Neurologic:  Alert and oriented x4 Psych:  Alert and cooperative. Normal mood and affect.  Assessment & Plan  Kelly Hebert is a 83 y.o. female presenting today with complaints of epigastric pain and left upper quadrant pain that radiates under her left armpit.    Chronic epigastric abdominal pain with extension to LUQ and axilla Intermittent sharp pain in the upper abdomen, exacerbated by eating and lying on the left side. Differential includes musculoskeletal pain due to pinched nerves and dietary triggers/GERD. Previous CTA ruled out mesenteric ischemia. EGD 2023 with esophagitis.  - Maintain a food and symptom diary to identify potential dietary triggers. - Consider musculoskeletal evaluation if pain persists. - As below will trial Voquezna 10 mg and if no improvement will resume pantoprazole  and increase to BID. Could consider trial of Voquezna 20 mg daily given history of esophagitis.  - Advised may need to consider repeat EGD.   Gastroesophageal reflux disease Burning sensation in the upper abdomen, consistent with acid reflux. Previous treatments include pantoprazole  and Dexilant , with Dexilant  being effective but not covered by insurance. Current treatment with pantoprazole . Discussed dietary modifications and the potential benefit of Voquezna, a newer medication targeting different stomach receptors. - Provided samples of Voquezna to replace pantoprazole . - If Voquezna is effective, will send prescription for continued  use. - If no improvement with Voquezna, will increase pantoprazole  to twice daily. - Maintain a food and symptom diary to identify dietary triggers.  IBS- constipation Bowel movements are well-managed with lubiprostone  (Amitiza ) and dietary modifications including increased fiber intake from apple juice and applesauce. No significant lower abdominal  pain reported. - Continue lubiprostone  (Amitiza ) 24 mcg BID for bowel regulation. - Maintain dietary modifications with increased fiber intake.      Follow up   Follow up 2 months.    Charmaine Melia, MSN, FNP-BC, AGACNP-BC [facility]

## 2024-02-19 ENCOUNTER — Other Ambulatory Visit: Payer: Self-pay | Admitting: Gastroenterology

## 2024-02-19 DIAGNOSIS — R109 Unspecified abdominal pain: Secondary | ICD-10-CM

## 2024-02-19 DIAGNOSIS — R1013 Epigastric pain: Secondary | ICD-10-CM
